# Patient Record
Sex: Female | Born: 1985
Health system: Southern US, Community
[De-identification: ages and names within clinical notes are randomized; demographics above are authoritative.]

## PROBLEM LIST (undated history)

## (undated) DIAGNOSIS — E669 Obesity, unspecified: Secondary | ICD-10-CM

## (undated) DIAGNOSIS — I10 Essential (primary) hypertension: Secondary | ICD-10-CM

## (undated) DIAGNOSIS — Z720 Tobacco use: Secondary | ICD-10-CM

## (undated) DIAGNOSIS — R7303 Prediabetes: Secondary | ICD-10-CM

## (undated) HISTORY — DX: Obesity, unspecified: E66.9

## (undated) HISTORY — DX: Tobacco use: Z72.0

## (undated) HISTORY — PX: WISDOM TOOTH EXTRACTION: SHX21

## (undated) HISTORY — DX: Prediabetes: R73.03

---
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
## 2006-04-27 ENCOUNTER — Other Ambulatory Visit: Admission: RE | Admit: 2006-04-27 | Discharge: 2006-04-27 | Payer: Self-pay | Admitting: Family Medicine

## 2006-09-06 ENCOUNTER — Other Ambulatory Visit: Admission: RE | Admit: 2006-09-06 | Discharge: 2006-09-06 | Payer: Self-pay | Admitting: Obstetrics and Gynecology

## 2007-02-03 ENCOUNTER — Inpatient Hospital Stay (HOSPITAL_COMMUNITY): Admission: AD | Admit: 2007-02-03 | Discharge: 2007-02-03 | Payer: Self-pay | Admitting: Gynecology

## 2007-02-04 ENCOUNTER — Inpatient Hospital Stay (HOSPITAL_COMMUNITY): Admission: AD | Admit: 2007-02-04 | Discharge: 2007-02-05 | Payer: Self-pay | Admitting: Obstetrics & Gynecology

## 2007-02-04 ENCOUNTER — Ambulatory Visit: Payer: Self-pay | Admitting: Certified Nurse Midwife

## 2007-10-12 ENCOUNTER — Other Ambulatory Visit: Admission: RE | Admit: 2007-10-12 | Discharge: 2007-10-12 | Payer: Self-pay | Admitting: Family Medicine

## 2015-04-29 ENCOUNTER — Emergency Department (HOSPITAL_COMMUNITY)
Admission: EM | Admit: 2015-04-29 | Discharge: 2015-04-29 | Disposition: A | Discharge status: Home / Self Care | Payer: Medicaid Other | Source: Home / Self Care | Attending: Emergency Medicine | Admitting: Emergency Medicine

## 2015-04-29 ENCOUNTER — Encounter (HOSPITAL_COMMUNITY): Payer: Self-pay | Admitting: Emergency Medicine

## 2015-04-29 DIAGNOSIS — G5622 Lesion of ulnar nerve, left upper limb: Secondary | ICD-10-CM

## 2015-04-29 MED ORDER — GABAPENTIN 300 MG PO CAPS: 300.0000 mg | ORAL_CAPSULE | Freq: Every day | ORAL | Status: DC

## 2015-04-29 NOTE — ED Notes (Signed)
C/o left arm and hand numbness for a week No tx tried States numbness is moving up her shoulder

## 2015-04-29 NOTE — ED Provider Notes (Signed)
CSN: [id number] date & time 04/29/15  1300 History   First MD Initiated Contact with Patient 04/29/15 1315     Chief Complaint  Patient presents with  . Numbness   (Consider location/radiation/quality/duration/timing/severity/associated sxs/prior Treatment) HPI She is a 29 year old woman here for evaluation of left arm numbness. She states this started about 2 weeks ago with some numbness in her left little finger. She states it started after she woke up. She does state she sleeps with her arm tucked under her. Over the next several days the numbness spread to involve her ulnar forearm, ulnar hand, and ring finger.  She states it will get worse if she rests her arm on a table. She also states that her arm is abducted at the shoulder the numbness will go up into her upper arm and shoulder as well. She has had an occasional twitch of her biceps muscle as well. She denies any weakness or tingling.  History reviewed. No pertinent past medical history. No past surgical history on file. History reviewed. No pertinent family history. History  Substance Use Topics  . Smoking status: Not on file  . Smokeless tobacco: Not on file  . Alcohol Use: Not on file   OB History    No data available     Review of Systems As in history of present illness Allergies  Review of patient's allergies indicates no known allergies.  Home Medications   Prior to Admission medications   Medication Sig Start Date End Date Taking? Authorizing Provider  gabapentin (NEURONTIN) 300 MG capsule Take 1 capsule (300 mg total) by mouth at bedtime. For 2 weeks. 04/29/15   Charm Rings, MD   BP 168/117 mmHg  Pulse 74  Temp(Src) 98 F (36.7 C) (Oral)  Resp 18  SpO2 95% Physical Exam  Constitutional: She is oriented to person, place, and time. She appears well-developed and well-nourished. No distress.  Neck:  No cervical spine tenderness. Negative Spurling's.  Cardiovascular: Normal rate.    Pulmonary/Chest: Effort normal.  Musculoskeletal:  Left arm: No erythema or edema. 5 out of 5 strength in grip, interosseus, wrist extension, biceps, triceps, and shoulder abduction. Sensation is grossly intact throughout the arm. Reflexes are diminished but symmetric.  Neurological: She is alert and oriented to person, place, and time.    ED Course  Procedures (including critical care time) Labs Review Labs Reviewed - No data to display  Imaging Review No results found.   MDM   1. Ulnar neuropathy of left upper extremity    This is likely a compressive neuropathy of the ulnar nerve. Conservative measures as in after visit summary. Gabapentin at bedtime for the next 2 weeks. Follow-up if symptoms are worsening or do not improve in 2 weeks.  Her blood pressure is elevated. She denies any history of hypertension, but has not had a physical for a while. She does have Medicaid and is in the process of finding a doctor. I recommended that she check her blood pressure occasionally at the drug store to see if she is always high or if this is due to anxiety around her symptoms.    Charm Rings, MD 04/29/15 [phone]

## 2015-04-29 NOTE — Discharge Instructions (Signed)
Your ulnar nerve is irritated. This is likely from how you sleep and typing. Tape a pillow around your elbow at night to keep it straight and cushioned. Use a pillow or towel to pad your elbow when typing at work. Take gabapentin at bedtime for the next 2 weeks. This medicine will make you sleepy. If the pain is getting worse or has not improved in 2 weeks, please follow-up. Continue to work on finding a primary care doctor. Please check your blood pressure at the drug store to see if you are always high.

## 2016-09-02 HISTORY — PX: WISDOM TOOTH EXTRACTION: SHX21

## 2016-11-25 ENCOUNTER — Encounter: Payer: Self-pay | Admitting: Family Medicine

## 2016-11-25 ENCOUNTER — Ambulatory Visit (INDEPENDENT_AMBULATORY_CARE_PROVIDER_SITE_OTHER): Payer: Medicaid Other | Admitting: Family Medicine

## 2016-11-25 DIAGNOSIS — L0232 Furuncle of buttock: Secondary | ICD-10-CM

## 2016-11-25 NOTE — Patient Instructions (Signed)
Thank you for coming in today, it was so nice to see you! Today we talked about:    Boil: It looks like you have a boil on your right buttock. It does not look infected at this time. It is draining which is good. Please wash it at least once a day with soap and water and keep it nice and clean and dry. This should heal on its own. You may get a little more drainage over the next couple days. You can put a small gauze over the area if it continues to drain just so doesn't get on your clothes.   Reasons to come back to the clinic are if your pain gets worse, you get a fever, or the drainage doesn't stop after the next couple days.  Please follow up in the next appointment so we can get to establish care at this clinic.  Bring in all your medications or supplements to each appointment for review.   If you have any questions or concerns, please do not hesitate to call the office at [phone]. You can also message me directly via MyChart.   Sincerely,  Anders Simmondshristina Jibril Mcminn, MD   Skin Abscess A skin abscess is an infected area on or under your skin that contains pus and other material. An abscess can happen almost anywhere on your body. Some abscesses break open (rupture) on their own. Most continue to get worse unless they are treated. The infection can spread deeper into the body and into your blood, which can make you feel sick. Treatment usually involves draining the abscess. Follow these instructions at home: Abscess Care  If you have an abscess that has not drained, place a warm, clean, wet washcloth over the abscess several times a day. Do this as told by your doctor.  Follow instructions from your doctor about how to take care of your abscess. Make sure you:  Cover the abscess with a bandage (dressing).  Change your bandage or gauze as told by your doctor.  Wash your hands with soap and water before you change the bandage or gauze. If you cannot use soap and water, use hand  sanitizer.  Check your abscess every day for signs that the infection is getting worse. Check for:  More redness, swelling, or pain.  More fluid or blood.  Warmth.  More pus or a bad smell. Medicines  Take over-the-counter and prescription medicines only as told by your doctor.  If you were prescribed an antibiotic medicine, take it as told by your doctor. Do not stop taking the antibiotic even if you start to feel better. General instructions  To avoid spreading the infection:  Do not share personal care items, towels, or hot tubs with others.  Avoid making skin-to-skin contact with other people.  Keep all follow-up visits as told by your doctor. This is important. Contact a doctor if:  You have more redness, swelling, or pain around your abscess.  You have more fluid or blood coming from your abscess.  Your abscess feels warm when you touch it.  You have more pus or a bad smell coming from your abscess.  You have a fever.  Your muscles ache.  You have chills.  You feel sick. Get help right away if:  You have very bad (severe) pain.  You see red streaks on your skin spreading away from the abscess. This information is not intended to replace advice given to you by your health care provider. Make sure you discuss  any questions you have with your health care provider. Document Released: 03/07/2008 Document Revised: 05/15/2016 Document Reviewed: 07/29/2015 Elsevier Interactive Patient Education  2017 [employer].

## 2016-11-25 NOTE — Assessment & Plan Note (Addendum)
Boil on right buttock near gluteal cleft. Has already drained this morning, and no areas of fluctuance or surrounding erythema. No signs of systemic infection.  -Tylenol when necessary for pain -Instructed to clean the area with soap and water daily and keep clean and dry daily until it fully heals -Return precautions discussed -Will follow-up with me in the future to establish care

## 2016-11-25 NOTE — Progress Notes (Signed)
   Subjective:    Patient ID: Norma Ramsey , female   DOB: Dec 03, 1985 , 31 y.o..   MRN: [id number]  HPI  Norma Ramsey is here for a same day visit for:  1. Boil: Patient is that over the last couple days she's noticed a painful area on her buttocks. She thinks that this is in the middle of her buttocks. She felt a bump that has got bigger and more painful over the last couple days although this morning she noted that there was some blood that came from the area and the pain has come little bit better. She is not taking any medications for this. She does not have any diabetes or any other skin conditions. She denies any fever, dizziness, nausea, vomiting, diarrhea, abdominal pain, weakness. She notes that her appetite has been good. Normal stools.  Review of Systems: Per HPI. All other systems reviewed and are negative.   Past Medical History: Hypertension  Medications: None  Social Hx: Does not smoke   Objective:   BP (!) 146/100   Pulse 80   Temp 97.9 F (36.6 C) (Oral)   Wt (!) 324 lb 12.8 oz (147.3 kg)   SpO2 97%  Physical Exam  Gen: NAD, alert, cooperative with exam, well-appearing Cardiac: Regular rate and rhythm, no edema, capillary refill brisk  Respiratory: Clear to auscultation bilaterally, no wheezes, non-labored breathing Skin: Small 2-3 mm area of induration on the right upper buttock near the gluteal cleft, open but not actively draining any pus or blood, no fluctuation or surrounding erythema  Psych: good insight, normal mood and affect   Assessment & Plan:  Boil of buttock Boil on right buttock near gluteal cleft. Has already drained this morning, and no areas of fluctuance or surrounding erythema. No signs of systemic infection.  -Tylenol when necessary for pain -Instructed to clean the area with soap and water daily and keep clean and dry daily until it fully heals -Return precautions discussed -Will follow-up with me in the future to establish  care  Anders Simmondshristina Habib Kise, MD [facility], PGY-2

## 2016-12-18 NOTE — Progress Notes (Signed)
HPI:  Norma Ramsey is a 31 y.o. year old female who presents today for a new patient appointment to establish general primary care, also to discuss:  1. Weight gain: Patient is concerned that her weight continues to go up. She notes that she has had steady weight gain since 2008. In 2008 she was placed on bedrest during her pregnancy with her son. She believes that her weight gain started on. She notes that she used to work the third shift as well in 2010 for about one year which affected her sleep and eating patterns. She notes that she does not eat much, may be once a day she will eat a meal. She has been trying to stay away from "unhealthy "foods but nothing seems to help her lose weight. She is minimally physically active during the day as she has a desk job and at night she is home taking care of her son. She denies ever having any thyroid abnormalities but does note that her great uncle had thyroid cancer.  2. Concerned about diabetes: Patient is worried that she may have diabetes because some of her family members have it. She denies any polyuria, polydipsia, polyphagia. She notes that she has never been told that she has diabetes before.  ROS:  Review of Systems  Constitutional: Negative for fever and weight loss.  HENT: Negative for ear pain, hearing loss and sinus pain.   Eyes: Negative for blurred vision.  Respiratory: Negative for cough, shortness of breath and wheezing.   Cardiovascular: Negative for chest pain and leg swelling.  Gastrointestinal: Negative for abdominal pain, blood in stool, constipation, diarrhea, heartburn, melena, nausea and vomiting.  Genitourinary: Negative for dysuria and frequency.  Musculoskeletal: Negative for back pain and joint pain.  Skin: Negative for rash.  Neurological: Negative for dizziness, tingling, focal weakness and headaches.  Psychiatric/Behavioral: Negative for depression and suicidal ideas.   Past Medical Hx:  Past Medical History:    Diagnosis Date  . Obesity   . Tobacco abuse     Past Surgical Hx:  Past Surgical History:  Procedure Laterality Date  . WISDOM TOOTH EXTRACTION  09/2016    Family Hx: updated in Epic  Social Hx:  Social History   Social History  . Marital status: Single    Spouse name: N/A  . Number of children: N/A  . Years of education: some technical college   Occupational History  . customer service rep     Social History Main Topics  . Smoking status: Current Every Day Smoker    Packs/day: 0.30    Start date: 11/26/2011  . Smokeless tobacco: Never Used  . Alcohol use 0.6 oz/week    1 Glasses of wine per week  . Drug use: Yes    Frequency: 7.0 times per week    Types: Marijuana  . Sexual activity: No   Other Topics Concern  . Not on file   Social History Narrative  . No narrative on file  Lives in [place] Lives with parents, sister, niece, and son Works in Financial trader service  Health Maintenance:  Patient notes that she is up-to-date on her health maintenance. She had her Pap smear in 2016 or 2017 at [facility] primary care   PHYSICAL EXAM: BP 126/82   Pulse (!) 104   Temp 98.3 F (36.8 C) (Oral)   Ht 5\' 3"  (1.6 m)   Wt (!) 322 lb 6.4 oz (146.2 kg)   SpO2 99%   BMI 57.11 kg/m  General: In NAD, well developed, pleasant, morbidly obese Cardiovascular: RRR, normal s1 and s2, no rubs, gallops, or murmurs Respiratory: no increased work of breathing, clear to auscultation bilaterally Abdomen: soft, non-tender, obese abdomen, +BS Extremities: no edema MSK: normal ROM  Neuro: A&O x3, no gross motor defecits  Psych: appropriate mood and affect   ASSESSMENT/PLAN:  Health maintenance:  -Up-to-date per patient report, we will obtain records from prior PCP   Severe obesity (BMI >= 40) (HCC) Body mass index is 57.11 kg/m.Marland Kitchen. Patient does express interest in improving her nutrition and finding healthy ways to lose weight. -Will refer to Dr. Gerilyn PilgrimSykes our in-house nutritionist,  patient given Dr. Gerilyn PilgrimSykes phone number to call and schedule an appointment -Advised patient to eat at least 3 meals a day instead of just one large meal -We'll check TSH to screen for hypothyroidism and check hemoglobin A1c to screen for diabetes  Tobacco abuse Currently smokes about a third pack a day. Not interested in quitting at this time. -Smoking cessation counseling provided   Anders Simmondshristina Gambino, MD PGY-2 [facility]

## 2016-12-19 ENCOUNTER — Ambulatory Visit (INDEPENDENT_AMBULATORY_CARE_PROVIDER_SITE_OTHER): Payer: Medicaid Other | Admitting: Family Medicine

## 2016-12-19 ENCOUNTER — Encounter: Payer: Self-pay | Admitting: Family Medicine

## 2016-12-19 VITALS — BP 126/82 | HR 104 | Temp 98.3°F | Ht 63.0 in | Wt 322.4 lb

## 2016-12-19 DIAGNOSIS — Z1329 Encounter for screening for other suspected endocrine disorder: Secondary | ICD-10-CM

## 2016-12-19 DIAGNOSIS — E669 Obesity, unspecified: Secondary | ICD-10-CM

## 2016-12-19 DIAGNOSIS — Z131 Encounter for screening for diabetes mellitus: Secondary | ICD-10-CM

## 2016-12-19 DIAGNOSIS — Z720 Tobacco use: Secondary | ICD-10-CM | POA: Insufficient documentation

## 2016-12-19 NOTE — Assessment & Plan Note (Signed)
Currently smokes about a third pack a day. Not interested in quitting at this time. -Smoking cessation counseling provided

## 2016-12-19 NOTE — Patient Instructions (Signed)
Thank you for coming in today, it was so nice to see you! Today we talked about:    Weight: I think that you would greatly benefit from seeing a nutritionist.  Please call Dr Gerilyn PilgrimSykes at [phone] to schedule an appointment.  Your blood pressure looks great!  I think we should screen for diabetes and thyroid issues. Please call the clinic 1 day before you plan to come in. You can come in anytime from 9am-5pm Monday through Friday.   Please follow up in 1 year for your next annual visit.   If you have any questions or concerns, please do not hesitate to call the office at [phone]. You can also message me directly via MyChart.   Sincerely,  Anders Simmondshristina Gambino, MD

## 2016-12-19 NOTE — Assessment & Plan Note (Addendum)
Body mass index is 57.11 kg/m.Marland Kitchen. Patient does express interest in improving her nutrition and finding healthy ways to lose weight. -Will refer to Dr. Gerilyn PilgrimSykes our in-house nutritionist, patient given Dr. Gerilyn PilgrimSykes phone number to call and schedule an appointment -Advised patient to eat at least 3 meals a day instead of just one large meal -We'll check TSH to screen for hypothyroidism and check hemoglobin A1c to screen for diabetes

## 2016-12-23 NOTE — Addendum Note (Signed)
Addended by: Beaulah DinningGAMBINO, CHRISTINA M on: 12/23/2016 09:45 AM   Modules accepted: Orders

## 2017-03-08 ENCOUNTER — Encounter: Payer: Self-pay | Admitting: Family Medicine

## 2017-03-08 ENCOUNTER — Observation Stay (HOSPITAL_COMMUNITY)
Admission: AD | Admit: 2017-03-08 | Discharge: 2017-03-09 | Disposition: A | Discharge status: Home / Self Care | Payer: Medicaid Other | Source: Ambulatory Visit | Attending: Family Medicine | Admitting: Family Medicine

## 2017-03-08 ENCOUNTER — Encounter (HOSPITAL_COMMUNITY): Payer: Self-pay | Admitting: *Deleted

## 2017-03-08 ENCOUNTER — Ambulatory Visit (INDEPENDENT_AMBULATORY_CARE_PROVIDER_SITE_OTHER): Payer: Medicaid Other | Admitting: Family Medicine

## 2017-03-08 VITALS — BP 194/117 | HR 93 | Temp 98.0°F | Ht 63.0 in | Wt 308.6 lb

## 2017-03-08 DIAGNOSIS — Z91410 Personal history of adult physical and sexual abuse: Secondary | ICD-10-CM | POA: Diagnosis not present

## 2017-03-08 DIAGNOSIS — R319 Hematuria, unspecified: Secondary | ICD-10-CM

## 2017-03-08 DIAGNOSIS — R3121 Asymptomatic microscopic hematuria: Secondary | ICD-10-CM | POA: Diagnosis not present

## 2017-03-08 DIAGNOSIS — R809 Proteinuria, unspecified: Secondary | ICD-10-CM | POA: Diagnosis not present

## 2017-03-08 DIAGNOSIS — Z6841 Body Mass Index (BMI) 40.0 and over, adult: Secondary | ICD-10-CM | POA: Insufficient documentation

## 2017-03-08 DIAGNOSIS — R030 Elevated blood-pressure reading, without diagnosis of hypertension: Secondary | ICD-10-CM | POA: Diagnosis not present

## 2017-03-08 DIAGNOSIS — R7303 Prediabetes: Secondary | ICD-10-CM | POA: Insufficient documentation

## 2017-03-08 DIAGNOSIS — I161 Hypertensive emergency: Secondary | ICD-10-CM | POA: Diagnosis not present

## 2017-03-08 DIAGNOSIS — Z131 Encounter for screening for diabetes mellitus: Secondary | ICD-10-CM

## 2017-03-08 DIAGNOSIS — Z1329 Encounter for screening for other suspected endocrine disorder: Secondary | ICD-10-CM

## 2017-03-08 DIAGNOSIS — I160 Hypertensive urgency: Secondary | ICD-10-CM | POA: Diagnosis not present

## 2017-03-08 DIAGNOSIS — E669 Obesity, unspecified: Secondary | ICD-10-CM

## 2017-03-08 DIAGNOSIS — F17200 Nicotine dependence, unspecified, uncomplicated: Secondary | ICD-10-CM | POA: Insufficient documentation

## 2017-03-08 DIAGNOSIS — E876 Hypokalemia: Secondary | ICD-10-CM | POA: Diagnosis not present

## 2017-03-08 LAB — POCT URINALYSIS DIP (MANUAL ENTRY)
BILIRUBIN UA: NEGATIVE mg/dL
Glucose, UA: NEGATIVE mg/dL
Nitrite, UA: NEGATIVE
PH UA: 6.5 (ref 5.0–8.0)
Protein Ur, POC: 30 mg/dL — AB
SPEC GRAV UA: 1.02 (ref 1.010–1.025)
Urobilinogen, UA: 0.2 E.U./dL

## 2017-03-08 LAB — CBC
HEMATOCRIT: 44.5 % (ref 36.0–46.0)
Hemoglobin: 15.3 g/dL — ABNORMAL HIGH (ref 12.0–15.0)
MCH: 29.7 pg (ref 26.0–34.0)
MCHC: 34.4 g/dL (ref 30.0–36.0)
MCV: 86.2 fL (ref 78.0–100.0)
PLATELETS: 338 10*3/uL (ref 150–400)
RBC: 5.16 MIL/uL — AB (ref 3.87–5.11)
RDW: 13.2 % (ref 11.5–15.5)
WBC: 12.1 10*3/uL — AB (ref 4.0–10.5)

## 2017-03-08 LAB — BASIC METABOLIC PANEL
ANION GAP: 8 (ref 5–15)
BUN: 6 mg/dL (ref 6–20)
CO2: 27 mmol/L (ref 22–32)
Calcium: 9 mg/dL (ref 8.9–10.3)
Chloride: 100 mmol/L — ABNORMAL LOW (ref 101–111)
Creatinine, Ser: 0.73 mg/dL (ref 0.44–1.00)
Glucose, Bld: 103 mg/dL — ABNORMAL HIGH (ref 65–99)
POTASSIUM: 3.2 mmol/L — AB (ref 3.5–5.1)
Sodium: 135 mmol/L (ref 135–145)

## 2017-03-08 LAB — TSH: TSH: 1.552 u[IU]/mL (ref 0.350–4.500)

## 2017-03-08 LAB — POCT HEMOGLOBIN: Hemoglobin: 15.5 g/dL (ref 12.2–16.2)

## 2017-03-08 LAB — POCT UA - MICROSCOPIC ONLY

## 2017-03-08 LAB — POCT GLYCOSYLATED HEMOGLOBIN (HGB A1C): HEMOGLOBIN A1C: 5.8

## 2017-03-08 MED ORDER — SODIUM CHLORIDE 0.9% FLUSH
3.0000 mL | Freq: Two times a day (BID) | INTRAVENOUS | Status: DC
  Administered 2017-03-08: 3 mL via INTRAVENOUS

## 2017-03-08 MED ORDER — ZOLPIDEM TARTRATE 5 MG PO TABS
5.0000 mg | ORAL_TABLET | Freq: Every evening | ORAL | Status: DC | PRN
  Administered 2017-03-08: 5 mg via ORAL
  Filled 2017-03-08: qty 1

## 2017-03-08 MED ORDER — AMLODIPINE BESYLATE 5 MG PO TABS
5.0000 mg | ORAL_TABLET | Freq: Every day | ORAL | Status: DC
  Administered 2017-03-08 – 2017-03-09 (×2): 5 mg via ORAL
  Filled 2017-03-08 (×2): qty 1

## 2017-03-08 MED ORDER — HYDRALAZINE HCL 20 MG/ML IJ SOLN: 5.0000 mg | INTRAMUSCULAR | Status: DC | PRN

## 2017-03-08 MED ORDER — PNEUMOCOCCAL VAC POLYVALENT 25 MCG/0.5ML IJ INJ
0.5000 mL | INJECTION | INTRAMUSCULAR | Status: DC
  Filled 2017-03-08: qty 0.5

## 2017-03-08 MED ORDER — ACETAMINOPHEN 650 MG RE SUPP: 650.0000 mg | Freq: Four times a day (QID) | RECTAL | Status: DC | PRN

## 2017-03-08 MED ORDER — LORAZEPAM 0.5 MG PO TABS
0.5000 mg | ORAL_TABLET | Freq: Two times a day (BID) | ORAL | Status: DC | PRN
  Administered 2017-03-08 – 2017-03-09 (×2): 0.5 mg via ORAL
  Filled 2017-03-08 (×3): qty 1

## 2017-03-08 MED ORDER — CLONIDINE HCL 0.1 MG PO TABS
0.1000 mg | ORAL_TABLET | Freq: Once | ORAL | Status: AC
  Administered 2017-03-08: 0.1 mg via ORAL

## 2017-03-08 MED ORDER — POTASSIUM CHLORIDE CRYS ER 20 MEQ PO TBCR
40.0000 meq | EXTENDED_RELEASE_TABLET | Freq: Once | ORAL | Status: AC
  Administered 2017-03-08: 40 meq via ORAL
  Filled 2017-03-08: qty 2

## 2017-03-08 MED ORDER — ENOXAPARIN SODIUM 40 MG/0.4ML ~~LOC~~ SOLN
40.0000 mg | SUBCUTANEOUS | Status: DC
  Administered 2017-03-09: 40 mg via SUBCUTANEOUS
  Filled 2017-03-08: qty 0.4

## 2017-03-08 MED ORDER — ACETAMINOPHEN 325 MG PO TABS: 650.0000 mg | ORAL_TABLET | Freq: Four times a day (QID) | ORAL | Status: DC | PRN

## 2017-03-08 MED ORDER — HEPARIN SODIUM (PORCINE) 5000 UNIT/ML IJ SOLN: 5000.0000 [IU] | Freq: Three times a day (TID) | INTRAMUSCULAR | Status: DC

## 2017-03-08 NOTE — Progress Notes (Signed)
Patient requested sleep aid, MD paged.

## 2017-03-08 NOTE — Assessment & Plan Note (Addendum)
Blood pressure at outside facility >200/100 and was 200/113 today in office.  No objective signs/symptoms of end organ damage, with the exception hematuria possibly being related to elevated pressures.  Was given clonidine 0.1mg  and observed for 30 min with minimal improvement of BP to 194/117.  Discussed case with Dr. Lum BabeEniola who agrees that admission is appropriate for this patient.  Unclear etiology. Patient denies bradycardia/tachycardia, heat/cold intolerance, diarrhea or constipation and is unlikely to be related to thyroid. Will check BMP to evaluate for hypokalemia and order renin/aldosterone if confirmed. Can trend pulse ox readings and consider sleep apnea induced HTN given morbid obesity.  - admit for observation with telemetry under attending Eniola - TSH, FT4 - continuous pulse ox  - f/u BMP - renin/aldosterone ratio if hypokalemic - renal doppler - hydralazine 5mg   PRN SBP >180 and DBP >110 - start on 5-10 mg amlodipine daily

## 2017-03-08 NOTE — Assessment & Plan Note (Addendum)
Patient noted to have hematuria at outside facility and had mod blood on dipstick with 0-3 RBCs on UA today in office.  Denies dysuria, increased urinary frequency and has no CVA tenderness or suprapubic tenderness on exam.  Unlikely to be urinary tract infection. No objective signs/symptoms of kidney stones. Not able to rule out more serious pathology like RCC at this time.  - renal u/s, consider CT - f/u urine culture - BMP - CBC - consider urology c/s
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
## 2017-03-08 NOTE — Progress Notes (Signed)
Subjective:  Norma Ramsey is a 31 y.o. female who presents to the [facility] today for a follow-up for hematuria and hypertension  HPI:  Norma Ramsey is a 31 year old female presenting today for a follow up for blood in her urine and hypertensive urgency after being seen at Urgent Care in [place] while in vacation.  She presented for nausea, vomiting and some loose stools and was noted to have hematuria on a routine UA and had blood pressure >200/100. At that time she had no chest pain, SOB, changes in vision or headache and denies any symptoms today.  Additionally, she has no dysuria, increased urinary frequency.  She has not had sex in 6 months and has a nexplanon and rarely has her period and is not currently spotting.  She denies any back pain or abdominal pain.   PMH: morbid obesity Tobacco use: current smoker 10 cigarettes per day Medication: reviewed and updated ROS: see HPI   Objective:  Physical Exam: BP (!) 194/117   Pulse 93   Temp 98 F (36.7 C) (Oral)   Ht 5\' 3"  (1.6 m)   Wt (!) 308 lb 9.6 oz (140 kg)   SpO2 98%   BMI 54.67 kg/m   Gen: 31 yo F in NAD, resting comfortably CV: RRR with no murmurs appreciated Pulm: NWOB, CTAB with no crackles, wheezes, or rhonchi GI: Normal bowel sounds present. Soft, Nontender, Nondistended. MSK: no edema, cyanosis, or clubbing noted, no CVA tenderness Skin: warm, dry Neuro: CN 2-12 WNL. No FND, weakness or changes in sensation.  Psych: Normal affect and thought content  Results for orders placed or performed in visit on 03/08/17 (from the past 72 hour(s))  POCT glycosylated hemoglobin (Hb A1C)     Status: None   Collection Time: 03/08/17  3:00 PM  Result Value Ref Range   Hemoglobin A1C 5.8   POCT urinalysis dipstick     Status: Abnormal   Collection Time: 03/08/17  3:00 PM  Result Value Ref Range   Color, UA yellow yellow   Clarity, UA turbid (A) clear   Glucose, UA negative negative mg/dL   Bilirubin, UA small (A) negative   Ketones, POC UA negative negative mg/dL   Spec Grav, UA 1.610 9.604 - 1.025   Blood, UA moderate (A) negative   pH, UA 6.5 5.0 - 8.0   Protein Ur, POC =30 (A) negative mg/dL   Urobilinogen, UA 0.2 0.2 or 1.0 E.U./dL   Nitrite, UA Negative Negative   Leukocytes, UA Trace (A) Negative  Hemoglobin     Status: None   Collection Time: 03/08/17  3:00 PM  Result Value Ref Range   Hemoglobin 15.5 12.2 - 16.2 g/dL  POCT UA - Microscopic Only     Status: Abnormal   Collection Time: 03/08/17  3:00 PM  Result Value Ref Range   WBC, Ur, HPF, POC NONE    RBC, urine, microscopic 0-3    Bacteria, U Microscopic 2+ RODS    Epithelial cells, urine per micros 5-10      Assessment/Plan:  Hematuria Patient noted to have hematuria at outside facility and had mod blood on dipstick with 0-3 RBCs on UA today in office.  Denies dysuria, increased urinary frequency and has no CVA tenderness or suprapubic tenderness on exam.  Unlikely to be urinary tract infection. No objective signs/symptoms of kidney stones. Not able to rule out more serious pathology like RCC at this time.  - renal u/s, consider CT - f/u urine  culture - BMP - CBC - consider urology c/s  Hypertensive urgency Blood pressure at outside facility >200/100 and was 200/113 today in office.  No objective signs/symptoms of end organ damage, with the exception hematuria possibly being related to elevated pressures.  Was given clonidine 0.1mg  and observed for 30 min with minimal improvement of BP to 194/117.  Discussed case with Dr. Lum BabeEniola who agrees that admission is appropriate for this patient.  Unclear etiology. Patient denies bradycardia/tachycardia, heat/cold intolerance, diarrhea or constipation and is unlikely to be related to thyroid. Will check BMP to evaluate for hypokalemia and order renin/aldosterone if confirmed. Can trend pulse ox readings and consider sleep apnea induced HTN given morbid obesity.  - admit for observation with telemetry  under attending Eniola - continuous pulse ox  - f/u BMP - renin/aldosterone ratio if hypokalemic - renal doppler - hydralazine 5mg   PRN SBP >180 and DBP >110 - start on 5-10 mg amlodipine daily

## 2017-03-09 ENCOUNTER — Observation Stay (HOSPITAL_COMMUNITY): Payer: Medicaid Other

## 2017-03-09 DIAGNOSIS — I161 Hypertensive emergency: Secondary | ICD-10-CM | POA: Diagnosis not present

## 2017-03-09 DIAGNOSIS — I160 Hypertensive urgency: Secondary | ICD-10-CM | POA: Diagnosis not present

## 2017-03-09 LAB — LIPID PANEL
CHOL/HDL RATIO: 5.6 ratio — AB (ref 0.0–4.4)
Cholesterol, Total: 219 mg/dL — ABNORMAL HIGH (ref 100–199)
HDL: 39 mg/dL — ABNORMAL LOW (ref >39)
LDL Calculated: 157 mg/dL — ABNORMAL HIGH (ref 0–99)
Triglycerides: 114 mg/dL (ref 0–149)
VLDL Cholesterol Cal: 23 mg/dL (ref 5–40)

## 2017-03-09 LAB — CBC
HCT: 41.6 % (ref 36.0–46.0)
Hemoglobin: 14.2 g/dL (ref 12.0–15.0)
MCH: 29.3 pg (ref 26.0–34.0)
MCHC: 34.1 g/dL (ref 30.0–36.0)
MCV: 86 fL (ref 78.0–100.0)
PLATELETS: 297 10*3/uL (ref 150–400)
RBC: 4.84 MIL/uL (ref 3.87–5.11)
RDW: 13.2 % (ref 11.5–15.5)
WBC: 12.6 10*3/uL — ABNORMAL HIGH (ref 4.0–10.5)

## 2017-03-09 LAB — COMPREHENSIVE METABOLIC PANEL
ALT: 41 IU/L — AB (ref 0–32)
AST: 38 IU/L (ref 0–40)
Albumin/Globulin Ratio: 1.4 (ref 1.2–2.2)
Albumin: 4.3 g/dL (ref 3.5–5.5)
Alkaline Phosphatase: 131 IU/L — ABNORMAL HIGH (ref 39–117)
BUN/Creatinine Ratio: 11 (ref 9–23)
BUN: 7 mg/dL (ref 6–20)
Bilirubin Total: 0.5 mg/dL (ref 0.0–1.2)
CALCIUM: 9.2 mg/dL (ref 8.7–10.2)
CO2: 24 mmol/L (ref 18–29)
CREATININE: 0.66 mg/dL (ref 0.57–1.00)
Chloride: 101 mmol/L (ref 96–106)
GFR calc Af Amer: 138 mL/min/{1.73_m2} (ref >59)
GFR, EST NON AFRICAN AMERICAN: 120 mL/min/{1.73_m2} (ref >59)
GLOBULIN, TOTAL: 3.1 g/dL (ref 1.5–4.5)
Glucose: 100 mg/dL — ABNORMAL HIGH (ref 65–99)
Potassium: 3.7 mmol/L (ref 3.5–5.2)
SODIUM: 142 mmol/L (ref 134–144)
Total Protein: 7.4 g/dL (ref 6.0–8.5)

## 2017-03-09 LAB — CBC WITH DIFFERENTIAL/PLATELET
BASOS: 0 %
Basophils Absolute: 0 10*3/uL (ref 0.0–0.2)
EOS (ABSOLUTE): 0.4 10*3/uL (ref 0.0–0.4)
EOS: 3 %
HEMATOCRIT: 44 % (ref 34.0–46.6)
Hemoglobin: 15.1 g/dL (ref 11.1–15.9)
Immature Grans (Abs): 0 10*3/uL (ref 0.0–0.1)
Immature Granulocytes: 0 %
LYMPHS ABS: 3.6 10*3/uL — AB (ref 0.7–3.1)
Lymphs: 35 %
MCH: 30.4 pg (ref 26.6–33.0)
MCHC: 34.3 g/dL (ref 31.5–35.7)
MCV: 89 fL (ref 79–97)
MONOS ABS: 1 10*3/uL — AB (ref 0.1–0.9)
Monocytes: 10 %
NEUTROS ABS: 5.4 10*3/uL (ref 1.4–7.0)
Neutrophils: 52 %
Platelets: 329 10*3/uL (ref 150–379)
RBC: 4.96 x10E6/uL (ref 3.77–5.28)
RDW: 14.2 % (ref 12.3–15.4)
WBC: 10.4 10*3/uL (ref 3.4–10.8)

## 2017-03-09 LAB — BASIC METABOLIC PANEL
Anion gap: 8 (ref 5–15)
BUN: 7 mg/dL (ref 6–20)
CALCIUM: 8.7 mg/dL — AB (ref 8.9–10.3)
CO2: 26 mmol/L (ref 22–32)
CREATININE: 0.7 mg/dL (ref 0.44–1.00)
Chloride: 101 mmol/L (ref 101–111)
GFR calc non Af Amer: >60 mL/min (ref >60)
Glucose, Bld: 117 mg/dL — ABNORMAL HIGH (ref 65–99)
Potassium: 3.5 mmol/L (ref 3.5–5.1)
Sodium: 135 mmol/L (ref 135–145)

## 2017-03-09 LAB — HIV ANTIBODY (ROUTINE TESTING W REFLEX): HIV Screen 4th Generation wRfx: NONREACTIVE

## 2017-03-09 LAB — TSH: TSH: 0.78 u[IU]/mL (ref 0.450–4.500)

## 2017-03-09 LAB — PREGNANCY, URINE: Preg Test, Ur: NEGATIVE

## 2017-03-09 MED ORDER — HYDROCHLOROTHIAZIDE 12.5 MG PO CAPS
12.5000 mg | ORAL_CAPSULE | Freq: Every day | ORAL | Status: DC
  Administered 2017-03-09: 12.5 mg via ORAL
  Filled 2017-03-09: qty 1

## 2017-03-09 MED ORDER — AMLODIPINE BESYLATE 10 MG PO TABS
10.0000 mg | ORAL_TABLET | Freq: Every day | ORAL | Status: DC
  Filled 2017-03-09: qty 1

## 2017-03-09 MED ORDER — ENOXAPARIN SODIUM 80 MG/0.8ML ~~LOC~~ SOLN: 65.0000 mg | SUBCUTANEOUS | Status: DC

## 2017-03-09 MED ORDER — AMLODIPINE BESYLATE 5 MG PO TABS
5.0000 mg | ORAL_TABLET | Freq: Once | ORAL | Status: AC
  Administered 2017-03-09: 5 mg via ORAL
  Filled 2017-03-09: qty 1

## 2017-03-09 MED ORDER — HYDROCHLOROTHIAZIDE 12.5 MG PO CAPS: 12.5000 mg | ORAL_CAPSULE | Freq: Every day | ORAL | 0 refills | Status: DC

## 2017-03-09 MED ORDER — AMLODIPINE BESYLATE 10 MG PO TABS: 10.0000 mg | ORAL_TABLET | Freq: Every day | ORAL | 0 refills | Status: DC

## 2017-03-09 NOTE — Plan of Care (Signed)
Problem: Safety: Goal: Ability to remain free from injury will improve Outcome: Progressing Patient verbalized understanding of preventions to reduce fall and injury.  Pt correctly demonstrated use of call bell when needing assistance.  Pt progressing towards goal.

## 2017-03-09 NOTE — Progress Notes (Signed)
  Family Medicine Teaching Service Daily Progress Note Intern Pager: [phone]  Patient name: Norma Ramsey Medical record number: [id number] Date of birth: 1985-11-18 Age: 31 y.o. Gender: female  Primary Care Provider: Beaulah DinningGambino, Christina M, MD Consultants: none Code Status: Full  Pt Overview and Major Events to Date:  6/6- admitted to FPTS for HBP  Assessment and Plan: Norma Ramsey is a 31 y.o. female presenting with severely elevated blood pressure and concern for end organ damage with hematuria. PMH is significant for obesity and tobacco abuse.  Hypertensive Urgency vs. Emergency: Improved with Norvasc but still not at goal. Persistently elevated this AM 173/97 this morning - renal US pending - renin/aldosterone pending - Continue amlodipine 5 mg daily, will likely need increased dose at outpt follow up - PRN hydralazine for SBP > 180 and DBP > 110  Hematuria: May be sequela of elevated BP. Upreg negative, Hgb stable - Follow urine culture - renal US pending  Prediabetes: Hgb A1c 5.8 - continue counseling on lifestyle modification  Tobacco abuse: - Declines nicotine patch - continue to encouraged cessation  FEN/GI: Heart healthy diet Prophylaxis: lovenox  Disposition: home pending renal US  Subjective:  Ms. Norma Ramsey is doing well this morning, denies any complaints. No HA, Vision changes. She is tearful throughout the interview when discussing weight and health issues. She endorses poor sleep and snoring.   Objective: Temp:  [97.7 F (36.5 C)-98.5 F (36.9 C)] 98.5 F (36.9 C) (06/07 0828) Pulse Rate:  [78-93] 83 (06/07 0828) Resp:  [17-20] 17 (06/07 0648) BP: (144-200)/(92-117) 173/97 (06/07 0828) SpO2:  [98 %-100 %] 100 % (06/07 0828) Weight:  [304 lb 12.8 oz (138.3 kg)-308 lb 9.6 oz (140 kg)] 304 lb 12.8 oz (138.3 kg) (06/07 30860648) Physical Exam: General: obese female in NAD Cardiovascular: RRR no MRG Respiratory: CTAB no increased WOB Abdomen:  soft, NTND Extremities: no edema or cyanosis Psych: tearful, mood is anxious, appropriate affect  Laboratory:  Recent Labs Lab 03/08/17 1500 03/08/17 1845 03/09/17 0402  WBC  --  12.1* 12.6*  HGB 15.5 15.3* 14.2  HCT  --  44.5 41.6  PLT  --  338 297    Recent Labs Lab 03/08/17 1845 03/09/17 0402  NA 135 135  K 3.2* 3.5  CL 100* 101  CO2 27 26  BUN 6 7  CREATININE 0.73 0.70  CALCIUM 9.0 8.7*  GLUCOSE 103* 117*   HIV NR TSH 1.552 Upreg negative A1C 5.8  Imaging/Diagnostic Tests: No results found.  Tillman SersRiccio, Lethia Donlon C, DO 03/09/2017, 9:47 AM PGY-1, [facility] FPTS Intern pager: [phone], text pages welcome

## 2017-03-09 NOTE — Progress Notes (Signed)
Transitions of Care Pharmacy Note  Plan:  Educated on new antihypertensive regimen of HCTZ and amlodipine Counseled on possible AE and S/Sx hypotension --------------------------------------------- Norma Ramsey is an 31 y.o. female who presents with a chief complaint hypertension. In anticipation of discharge, pharmacy has reviewed this patient's prior to admission medication history, as well as [facility].  Current medication indications, dosing, frequency, and notable side effects reviewed with patient. patient verbalized understanding of current inpatient medication regimen and is aware that the After Visit Summary when presented, will represent the most accurate medication list at discharge.   Assessment: Understanding of regimen: good Understanding of indications: excellent Potential of compliance: fair Barriers to Obtaining Medications: No  Patient instructed to contact inpatient pharmacy team with further questions or concerns if needed.    Time spent preparing for discharge counseling: 15 Time spent counseling patient: 5   Thank you for allowing pharmacy to be a part of this patient's care.  Fredonia HighlandMichael Denzal Meir, PharmD PGY-1 Pharmacy Resident Pager: [phone] 03/09/2017

## 2017-03-09 NOTE — Plan of Care (Signed)
Problem: Food- and Nutrition-Related Knowledge Deficit (NB-1.1) Goal: Nutrition education Formal process to instruct or train a patient/client in a skill or to impart knowledge to help patients/clients voluntarily manage or modify food choices and eating behavior to maintain or improve health.  Outcome: Completed/Met Date Met: 03/09/17 Nutrition Education Note  RD consulted for nutrition education regarding new HTN.   RD provided "Hypertensive Nutrition Therapy" handout as well as "General, Healthy Nutrition Therapy" from the Academy of Nutrition and Dietetics. Reviewed patient's dietary recall. Provided examples on ways to decrease sodium and fat intake in diet. Discouraged intake of processed foods and use of salt shaker. Encouraged fresh fruits and vegetables as well as whole grain sources of carbohydrates to maximize fiber intake. Teach back method used.  Expect good compliance.  Body mass index is 53.99 kg/m. Pt meets criteria for morbid obesity based on current BMI.  Pt being discharged to home today.   Kerman Passey MS, RD, LDN [phone] Pager  [phone] Weekend/On-Call Pager

## 2017-03-09 NOTE — Discharge Summary (Signed)
Family Medicine Teaching [facility] Discharge Summary  Patient name: Norma Ramsey Medical record number: [id number] Date of birth: [birth year] Age: 31 y.o. Gender: female Date of Admission: 03/08/2017  Date of Discharge: 03/09/17  Admitting Physician: Moses Manners, MD  Primary Care Provider: Beaulah Dinning, MD Consultants: none  Indication for Hospitalization: HTN urgency  Discharge Diagnoses/Problem List:  HTN urgency Hematuria Obesity Tobacco abuse Prediabetes  Disposition: home  Discharge Condition: stable, improved  Discharge Exam: see progress note from day of discharge  Brief Hospital Course:   Norma Ramsey is a 31 year old female with PMH of Obesity and Tobacco abuse who was directly admitted from [facility] due to hypertensive urgency. She was told to see her PCP by Urgent Care due to hematuria found on their work up several days prior to admission. She was given Clonidine at outpatient office with little improvement in BP. She was admitted to [facility] for management. Patient was noted to have persistent hematuria on admission with otherwise normal kidney function. Hematuria was mild and thought to be transient in nature. She was started on 5 mg of Norvasc and observed overnight. The following morning BP remained elevated so Norvasc was increased to 10mg  and 12.5 mg HCTZ was added. Renal US was normal. Patient was deemed stable for discharge with close BP monitoring in the outpatient setting.   Issues for Follow Up:  1. Follow up blood pressure and titrate up BP medications as needed, consider addition of ACE inhibitor in future if patient develops diabetes 2. Monitor hematuria on outpatient basis 3. Patient endorses history of snoring, poor sleep, would benefit from outpatient sleep study 4. Patient interested in working with nutritionist to work on weight loss 5. Patient interested in possibly pursuing bariatric surgery, would benefit from counseling on  this topic and possible referral to bariatric surgeon 6. Continue to encourage smoking cessation  Significant Procedures: none  Significant Labs and Imaging:   Recent Labs Lab 03/08/17 1500 03/08/17 1845 03/09/17 0402  WBC  --  12.1* 12.6*  HGB 15.5 15.3* 14.2  HCT  --  44.5 41.6  PLT  --  338 297    Recent Labs Lab 03/08/17 1845 03/09/17 0402  NA 135 135  K 3.2* 3.5  CL 100* 101  CO2 27 26  GLUCOSE 103* 117*  BUN 6 7  CREATININE 0.73 0.70  CALCIUM 9.0 8.7*    A1C 5.8 HIV negative TSH 1.552  Results/Tests Pending at Time of Discharge: final urine culture, renin/aldosterone labs  Discharge Medications:  Allergies as of 03/09/2017   No Known Allergies     Medication List    STOP taking these medications   CORICIDIN D PO     TAKE these medications   amLODipine 10 MG tablet Commonly known as:  NORVASC Take 1 tablet (10 mg total) by mouth daily. Start taking on:  03/10/2017   hydrochlorothiazide 12.5 MG capsule Commonly known as:  MICROZIDE Take 1 capsule (12.5 mg total) by mouth daily. Start taking on:  03/10/2017   NEXPLANON 68 MG Impl implant Generic drug:  etonogestrel 1 each by Subdermal route once.       Discharge Instructions: Please refer to Patient Instructions section of EMR for full details.  Patient was counseled important signs and symptoms that should prompt return to medical care, changes in medications, dietary instructions, activity restrictions, and follow up appointments.   Follow-Up Appointments: Follow-up Information    Beaulah Dinning, MD Follow up.   Specialty:  Family Medicine Contact information: [address] [place] [address] [phone]        [facility]. Go on 03/13/2017.   Specialty:  Family Medicine Why:  at 9 am for nurse blood pressure check Contact information: [address] [id number] [address] [place] [id number] [phone]          Tillman SersRiccio, Romy Mcgue  C, DO 03/09/2017, 2:00 PM PGY-1, [facility]

## 2017-03-10 LAB — URINE CULTURE

## 2017-03-13 ENCOUNTER — Ambulatory Visit (INDEPENDENT_AMBULATORY_CARE_PROVIDER_SITE_OTHER): Payer: Medicaid Other | Admitting: *Deleted

## 2017-03-13 VITALS — BP 138/106 | HR 98

## 2017-03-13 DIAGNOSIS — I160 Hypertensive urgency: Secondary | ICD-10-CM

## 2017-03-13 NOTE — Progress Notes (Signed)
Patient in today for BP check. BP taken manually in left and right arms with a large cuff. BP in left arm was 142/106 and right arm was 138/106. Patients reports regular taking of medications since ED discharge with no adverse symptoms other than fatigue. Precepted with Dr. Randolm IdolFletke who reccommends patient check BP regularly at pharmacy until upcoming appointment on 6/15 and to bring those readings with her to appointment. Patient advised of this and expressed understanding

## 2017-03-14 LAB — ALDOSTERONE + RENIN ACTIVITY W/ RATIO
ALDO / PRA Ratio: 5.2 (ref 0.0–30.0)
Aldosterone: 1.9 ng/dL (ref 0.0–30.0)
PRA LC/MS/MS: 0.363 ng/mL/h (ref 0.167–5.380)

## 2017-03-16 ENCOUNTER — Telehealth: Payer: Self-pay | Admitting: Family Medicine

## 2017-03-16 NOTE — Telephone Encounter (Signed)
Pre-visit call. No answer, LMOVM. Patient was advised to arrive early for check-in and to bring all current medication.  °

## 2017-03-17 ENCOUNTER — Encounter: Payer: Self-pay | Admitting: Family Medicine

## 2017-03-17 ENCOUNTER — Ambulatory Visit (INDEPENDENT_AMBULATORY_CARE_PROVIDER_SITE_OTHER): Payer: Medicaid Other | Admitting: Family Medicine

## 2017-03-17 VITALS — BP 125/90 | HR 69 | Temp 98.1°F | Ht 63.0 in | Wt 305.2 lb

## 2017-03-17 DIAGNOSIS — I10 Essential (primary) hypertension: Secondary | ICD-10-CM

## 2017-03-17 DIAGNOSIS — R319 Hematuria, unspecified: Secondary | ICD-10-CM | POA: Diagnosis present

## 2017-03-17 DIAGNOSIS — R822 Biliuria: Secondary | ICD-10-CM

## 2017-03-17 DIAGNOSIS — Z720 Tobacco use: Secondary | ICD-10-CM

## 2017-03-17 LAB — POCT URINALYSIS DIP (MANUAL ENTRY)
BILIRUBIN UA: NEGATIVE mg/dL
GLUCOSE UA: NEGATIVE mg/dL
Nitrite, UA: NEGATIVE
PROTEIN UA: NEGATIVE mg/dL
SPEC GRAV UA: 1.02 (ref 1.010–1.025)
Urobilinogen, UA: >=8 E.U./dL — AB
pH, UA: 6 (ref 5.0–8.0)

## 2017-03-17 LAB — POCT UA - MICROSCOPIC ONLY

## 2017-03-17 MED ORDER — HYDROCHLOROTHIAZIDE 25 MG PO TABS: 25.0000 mg | ORAL_TABLET | Freq: Every day | ORAL | 0 refills | Status: DC

## 2017-03-17 NOTE — Patient Instructions (Addendum)
Thank you for coming in today, it was so nice to see you! Today we talked about:    Blood pressure: Your blood pressure is slightly high today.We are going to increase your hydrochlorothiazide to 25 mg instead of 12.5 mg. I would suggest buying a blood pressure machine at [employer] and checking your pressure once a day. Your goal pressure is for the top number to be under 140 and the bottom number to be under 90. If your numbers are consistently higher than this, please call the clinic and I will call you back so we can talk about it. Continue to drink plenty of water until your urine is clear to light yellow  Dark urine: we have rechecked your urine today. I will call you if this is abnormal.  I think that you would greatly benefit from seeing a nutritionist.  Please call Dr Gerilyn Pilgrim at [phone] to schedule an appointment.  Quitting smoking is the best thing you can do for your health. I am here for you when you are ready to quit :)  Please follow up within the next month for your annual physical exam. You can schedule this appointment at the front desk before you leave or call the clinic.  Bring in all your medications or supplements to each appointment for review.   If we ordered any tests today, you will be notified via telephone of any abnormalities. If everything is normal you will get a letter in the mail.   If you have any questions or concerns, please do not hesitate to call the office at [phone]. You can also message me directly via MyChart.   Sincerely,  Anders Simmonds, MD    Hypertension Hypertension, commonly called high blood pressure, is when the force of blood pumping through the arteries is too strong. The arteries are the blood vessels that carry blood from the heart throughout the body. Hypertension forces the heart to work harder to pump blood and may cause arteries to become narrow or stiff. Having untreated or uncontrolled hypertension can cause heart attacks,  strokes, kidney disease, and other problems. A blood pressure reading consists of a higher number over a lower number. Ideally, your blood pressure should be below 140/90. The first ("top") number is called the systolic pressure. It is a measure of the pressure in your arteries as your heart beats. The second ("bottom") number is called the diastolic pressure. It is a measure of the pressure in your arteries as the heart relaxes. What are the causes? The cause of this condition is not known. What increases the risk? Some risk factors for high blood pressure are under your control. Others are not. Factors you can change  Smoking.  Having type 2 diabetes mellitus, high cholesterol, or both.  Not getting enough exercise or physical activity.  Being overweight.  Having too much fat, sugar, calories, or salt (sodium) in your diet.  Drinking too much alcohol. Factors that are difficult or impossible to change  Having chronic kidney disease.  Having a family history of high blood pressure.  Age. Risk increases with age.  Race. You may be at higher risk if you are African-American.  Gender. Men are at higher risk than women before age 78. After age 64, women are at higher risk than men.  Having obstructive sleep apnea.  Stress. What are the signs or symptoms? Extremely high blood pressure (hypertensive crisis) may cause:  Headache.  Anxiety.  Shortness of breath.  Nosebleed.  Nausea and vomiting.  Severe chest pain.  Jerky movements you cannot control (seizures).  How is this diagnosed? This condition is diagnosed by measuring your blood pressure while you are seated, with your arm resting on a surface. The cuff of the blood pressure monitor will be placed directly against the skin of your upper arm at the level of your heart. It should be measured at least twice using the same arm. Certain conditions can cause a difference in blood pressure between your right and left  arms. Certain factors can cause blood pressure readings to be lower or higher than normal (elevated) for a short period of time:  When your blood pressure is higher when you are in a health care provider's office than when you are at home, this is called white coat hypertension. Most people with this condition do not need medicines.  When your blood pressure is higher at home than when you are in a health care provider's office, this is called masked hypertension. Most people with this condition may need medicines to control blood pressure.  If you have a high blood pressure reading during one visit or you have normal blood pressure with other risk factors:  You may be asked to return on a different day to have your blood pressure checked again.  You may be asked to monitor your blood pressure at home for 1 week or longer.  If you are diagnosed with hypertension, you may have other blood or imaging tests to help your health care provider understand your overall risk for other conditions. How is this treated? This condition is treated by making healthy lifestyle changes, such as eating healthy foods, exercising more, and reducing your alcohol intake. Your health care provider may prescribe medicine if lifestyle changes are not enough to get your blood pressure under control, and if:  Your systolic blood pressure is above 130.  Your diastolic blood pressure is above 80.  Your personal target blood pressure may vary depending on your medical conditions, your age, and other factors. Follow these instructions at home: Eating and drinking  Eat a diet that is high in fiber and potassium, and low in sodium, added sugar, and fat. An example eating plan is called the DASH (Dietary Approaches to Stop Hypertension) diet. To eat this way: ? Eat plenty of fresh fruits and vegetables. Try to fill half of your plate at each meal with fruits and vegetables. ? Eat whole grains, such as whole wheat pasta,  brown rice, or whole grain bread. Fill about one quarter of your plate with whole grains. ? Eat or drink low-fat dairy products, such as skim milk or low-fat yogurt. ? Avoid fatty cuts of meat, processed or cured meats, and poultry with skin. Fill about one quarter of your plate with lean proteins, such as fish, chicken without skin, beans, eggs, and tofu. ? Avoid premade and processed foods. These tend to be higher in sodium, added sugar, and fat.  Reduce your daily sodium intake. Most people with hypertension should eat less than 1,500 mg of sodium a day.  Limit alcohol intake to no more than 1 drink a day for nonpregnant women and 2 drinks a day for men. One drink equals 12 oz of beer, 5 oz of wine, or 1 oz of hard liquor. Lifestyle  Work with your health care provider to maintain a healthy body weight or to lose weight. Ask what an ideal weight is for you.  Get at least 30 minutes of exercise that causes your  heart to beat faster (aerobic exercise) most days of the week. Activities may include walking, swimming, or biking.  Include exercise to strengthen your muscles (resistance exercise), such as pilates or lifting weights, as part of your weekly exercise routine. Try to do these types of exercises for 30 minutes at least 3 days a week.  Do not use any products that contain nicotine or tobacco, such as cigarettes and e-cigarettes. If you need help quitting, ask your health care provider.  Monitor your blood pressure at home as told by your health care provider.  Keep all follow-up visits as told by your health care provider. This is important. Medicines  Take over-the-counter and prescription medicines only as told by your health care provider. Follow directions carefully. Blood pressure medicines must be taken as prescribed.  Do not skip doses of blood pressure medicine. Doing this puts you at risk for problems and can make the medicine less effective.  Ask your health care provider  about side effects or reactions to medicines that you should watch for. Contact a health care provider if:  You think you are having a reaction to a medicine you are taking.  You have headaches that keep coming back (recurring).  You feel dizzy.  You have swelling in your ankles.  You have trouble with your vision. Get help right away if:  You develop a severe headache or confusion.  You have unusual weakness or numbness.  You feel faint.  You have severe pain in your chest or abdomen.  You vomit repeatedly.  You have trouble breathing. Summary  Hypertension is when the force of blood pumping through your arteries is too strong. If this condition is not controlled, it may put you at risk for serious complications.  Your personal target blood pressure may vary depending on your medical conditions, your age, and other factors. For most people, a normal blood pressure is less than 140/90.  Hypertension is treated with lifestyle changes, medicines, or a combination of both. Lifestyle changes include weight loss, eating a healthy, low-sodium diet, exercising more, and limiting alcohol. This information is not intended to replace advice given to you by your health care provider. Make sure you discuss any questions you have with your health care provider. Document Released: 09/19/2005 Document Revised: 08/17/2016 Document Reviewed: 08/17/2016 Elsevier Interactive Patient Education  [employer]2018 [employer]

## 2017-03-17 NOTE — Progress Notes (Signed)
Subjective:    Patient ID: Norma Ramsey , female   DOB: 04-30-1986 , 31 y.o..   MRN: [id number]  HPI  Norma Ramsey is here for  Chief Complaint  Patient presents with  . Hypertension    1. Hospital Follow up for hypertensive urgency:   Brief hospital course: Norma Ramsey is a 31 year old female with PMH of Obesity and Tobacco abuse who was directly [facility] due to hypertensive urgency. Patient was noted to have persistent hematuria on admission with otherwise normal kidney function. Hematuria was mild and thought to be transient in nature. She was started on 5 mg of Norvasc and observed overnight. The following morning BP remained elevated so Norvasc was increased to 10mg  and 12.5 mg HCTZ was added. Renal US was normal. Patient was deemed stable for discharge with close BP monitoring in the outpatient setting.  Since leaving the hospital, denies any hematuria. Notes that her urine is still dark though. Assumed she wasn't drinking enough water but has been drinking 64 ounces or more a day. She has been trying to lose weight but it hasn't been going well because she hasn't been losing anything. Denies any dysuria, flank pain, pelvic pain, or fever. Denies any chest pain, shortness of breath, or palpitations.   Review of Systems: Per HPI.   Past Medical History: Patient Active Problem List   Diagnosis Date Noted  . Essential hypertension 03/21/2017  . Hematuria 03/08/2017  . Hypertensive urgency 03/08/2017  . Hypertensive emergency 03/08/2017  . Obesity 12/19/2016  . Tobacco abuse 12/19/2016    Medications: reviewed and updated Current Outpatient Prescriptions  Medication Sig Dispense Refill  . amLODipine (NORVASC) 10 MG tablet Take 1 tablet (10 mg total) by mouth daily. 30 tablet 0  . etonogestrel (NEXPLANON) 68 MG IMPL implant 1 each by Subdermal route once.    . hydrochlorothiazide (HYDRODIURIL) 25 MG tablet Take 1 tablet (25 mg total) by mouth daily. 90  tablet 0   No current facility-administered medications for this visit.     Social Hx:  reports that she has been smoking.  She started smoking about 5 years ago. She has been smoking about 0.30 packs per day. She has never used smokeless tobacco.   Objective:   BP 125/90 (BP Location: Right Wrist, Patient Position: Sitting, Cuff Size: Normal)   Pulse 69   Temp 98.1 F (36.7 C) (Oral)   Ht 5\' 3"  (1.6 m)   Wt (!) 305 lb 3.2 oz (138.4 kg)   SpO2 99%   BMI 54.06 kg/m  Physical Exam  Gen: NAD, alert, cooperative with exam, well-appearing, obese HEENT: NCAT, PERRL, clear conjunctiva, oropharynx clear, supple neck Cardiac: Regular rate and rhythm, normal S1/S2, no murmur, no edema, capillary refill brisk  Respiratory: Clear to auscultation bilaterally, no wheezes, non-labored breathing Gastrointestinal: soft, non tender, non distended, bowel sounds present Skin: no rashes, normal turgor  Neurological: no gross deficits.  Psych: good insight, normal mood and affect  Assessment & Plan:  Essential hypertension Uncontrolled. Systolic normal, diastolic borderline high.  - Continue Amlodipine 10 mg daily - Increase HCTZ to 25 mg daily  - Check BP at home, discussed goal BP - Return to clinic in 3 months  Tobacco abuse Still smoking 1/3 PPD. Not interested in quiting - Smoking cessation counseling provided  Hematuria Persistent hematuria on UA today. Renal U/S normal. No UTI symptoms. Urine cx with insignificant growth.  - Will consult urology at this time  -  future order also obtained for CMP as there was bilirubin and urobilinogen in urine    Orders Placed This Encounter  Procedures  . Urine Culture  . Comprehensive metabolic panel    Standing Status:   Future    Standing Expiration Date:   03/21/2018    Order Specific Question:   Has the patient fasted?    Answer:   No  . Ambulatory referral to Urology    Referral Priority:   Routine    Referral Type:   Consultation     Referral Reason:   Specialty Services Required    Requested Specialty:   Urology    Number of Visits Requested:   1  . POCT urinalysis dipstick  . POCT UA - Microscopic Only   Meds ordered this encounter  Medications  . hydrochlorothiazide (HYDRODIURIL) 25 MG tablet    Sig: Take 1 tablet (25 mg total) by mouth daily.    Dispense:  90 tablet    Refill:  0    Anders Simmonds, MD [facility], PGY-2

## 2017-03-19 LAB — URINE CULTURE

## 2017-03-21 DIAGNOSIS — I10 Essential (primary) hypertension: Secondary | ICD-10-CM | POA: Insufficient documentation

## 2017-03-21 NOTE — Assessment & Plan Note (Addendum)
Uncontrolled. Systolic normal, diastolic borderline high.  - Continue Amlodipine 10 mg daily - Increase HCTZ to 25 mg daily  - Check BP at home, discussed goal BP - Return to clinic in 3 months

## 2017-03-21 NOTE — Assessment & Plan Note (Signed)
Still smoking 1/3 PPD. Not interested in quiting - Smoking cessation counseling provided

## 2017-03-21 NOTE — Assessment & Plan Note (Addendum)
Persistent hematuria on UA today. Renal U/S normal. No UTI symptoms. Urine cx with insignificant growth.  - Will consult urology at this time  - future order also obtained for CMP as there was bilirubin and urobilinogen in urine

## 2017-04-04 ENCOUNTER — Telehealth: Payer: Self-pay | Admitting: Family Medicine

## 2017-04-04 NOTE — Telephone Encounter (Signed)
Pt thinks her BP meds are making her nauseaus.  Pt would to discuss this with dr Jonathon Jordangambino.

## 2017-04-07 NOTE — Telephone Encounter (Signed)
Attempted to call patient to discuss further. No answer. Left voicemail. Will be happy to speak with her at a later time.   [name], MD [facility], PGY-3

## 2017-04-09 ENCOUNTER — Other Ambulatory Visit: Payer: Self-pay | Admitting: Family Medicine

## 2017-04-10 ENCOUNTER — Telehealth: Payer: Self-pay | Admitting: Family Medicine

## 2017-04-10 ENCOUNTER — Ambulatory Visit: Payer: Medicaid Other | Admitting: Student

## 2017-04-10 ENCOUNTER — Observation Stay (HOSPITAL_COMMUNITY): Payer: Medicaid Other

## 2017-04-10 ENCOUNTER — Observation Stay (HOSPITAL_COMMUNITY)
Admission: EM | Admit: 2017-04-10 | Discharge: 2017-04-11 | Disposition: A | Discharge status: Home / Self Care | Payer: Medicaid Other | Attending: Family Medicine | Admitting: Family Medicine

## 2017-04-10 ENCOUNTER — Encounter (HOSPITAL_COMMUNITY): Payer: Self-pay

## 2017-04-10 DIAGNOSIS — R112 Nausea with vomiting, unspecified: Secondary | ICD-10-CM | POA: Diagnosis not present

## 2017-04-10 DIAGNOSIS — Z79899 Other long term (current) drug therapy: Secondary | ICD-10-CM | POA: Insufficient documentation

## 2017-04-10 DIAGNOSIS — F1290 Cannabis use, unspecified, uncomplicated: Secondary | ICD-10-CM | POA: Diagnosis not present

## 2017-04-10 DIAGNOSIS — I4581 Long QT syndrome: Secondary | ICD-10-CM | POA: Insufficient documentation

## 2017-04-10 DIAGNOSIS — D72829 Elevated white blood cell count, unspecified: Secondary | ICD-10-CM | POA: Diagnosis not present

## 2017-04-10 DIAGNOSIS — R011 Cardiac murmur, unspecified: Secondary | ICD-10-CM | POA: Insufficient documentation

## 2017-04-10 DIAGNOSIS — E876 Hypokalemia: Secondary | ICD-10-CM | POA: Diagnosis present

## 2017-04-10 DIAGNOSIS — R3129 Other microscopic hematuria: Secondary | ICD-10-CM | POA: Insufficient documentation

## 2017-04-10 DIAGNOSIS — I10 Essential (primary) hypertension: Secondary | ICD-10-CM | POA: Diagnosis present

## 2017-04-10 DIAGNOSIS — R319 Hematuria, unspecified: Secondary | ICD-10-CM | POA: Diagnosis present

## 2017-04-10 DIAGNOSIS — E869 Volume depletion, unspecified: Secondary | ICD-10-CM | POA: Insufficient documentation

## 2017-04-10 DIAGNOSIS — Z6841 Body Mass Index (BMI) 40.0 and over, adult: Secondary | ICD-10-CM | POA: Insufficient documentation

## 2017-04-10 DIAGNOSIS — F17210 Nicotine dependence, cigarettes, uncomplicated: Secondary | ICD-10-CM | POA: Diagnosis not present

## 2017-04-10 DIAGNOSIS — F12988 Cannabis use, unspecified with other cannabis-induced disorder: Secondary | ICD-10-CM

## 2017-04-10 DIAGNOSIS — Z720 Tobacco use: Secondary | ICD-10-CM | POA: Diagnosis present

## 2017-04-10 DIAGNOSIS — R9431 Abnormal electrocardiogram [ECG] [EKG]: Secondary | ICD-10-CM

## 2017-04-10 DIAGNOSIS — E669 Obesity, unspecified: Secondary | ICD-10-CM | POA: Insufficient documentation

## 2017-04-10 DIAGNOSIS — R739 Hyperglycemia, unspecified: Secondary | ICD-10-CM | POA: Insufficient documentation

## 2017-04-10 DIAGNOSIS — R000 Tachycardia, unspecified: Secondary | ICD-10-CM | POA: Diagnosis present

## 2017-04-10 DIAGNOSIS — R3121 Asymptomatic microscopic hematuria: Secondary | ICD-10-CM | POA: Diagnosis not present

## 2017-04-10 HISTORY — DX: Essential (primary) hypertension: I10

## 2017-04-10 LAB — BASIC METABOLIC PANEL
ANION GAP: 10 (ref 5–15)
BUN: 5 mg/dL — ABNORMAL LOW (ref 6–20)
CALCIUM: 8.6 mg/dL — AB (ref 8.9–10.3)
CHLORIDE: 104 mmol/L (ref 101–111)
CO2: 24 mmol/L (ref 22–32)
Creatinine, Ser: 0.73 mg/dL (ref 0.44–1.00)
GFR calc Af Amer: >60 mL/min (ref >60)
GFR calc non Af Amer: >60 mL/min (ref >60)
GLUCOSE: 156 mg/dL — AB (ref 65–99)
Potassium: 2.8 mmol/L — ABNORMAL LOW (ref 3.5–5.1)
Sodium: 138 mmol/L (ref 135–145)

## 2017-04-10 LAB — CBC
HCT: 47 % — ABNORMAL HIGH (ref 36.0–46.0)
HEMATOCRIT: 41.3 % (ref 36.0–46.0)
HEMOGLOBIN: 14.8 g/dL (ref 12.0–15.0)
Hemoglobin: 17 g/dL — ABNORMAL HIGH (ref 12.0–15.0)
MCH: 31 pg (ref 26.0–34.0)
MCH: 30.9 pg (ref 26.0–34.0)
MCHC: 36.2 g/dL — ABNORMAL HIGH (ref 30.0–36.0)
MCHC: 35.8 g/dL (ref 30.0–36.0)
MCV: 85.6 fL (ref 78.0–100.0)
MCV: 86.2 fL (ref 78.0–100.0)
Platelets: 433 10*3/uL — ABNORMAL HIGH (ref 150–400)
Platelets: 383 10*3/uL (ref 150–400)
RBC: 5.49 MIL/uL — ABNORMAL HIGH (ref 3.87–5.11)
RBC: 4.79 MIL/uL (ref 3.87–5.11)
RDW: 12.7 % (ref 11.5–15.5)
RDW: 12.7 % (ref 11.5–15.5)
WBC: 23.4 10*3/uL — ABNORMAL HIGH (ref 4.0–10.5)
WBC: 21.2 10*3/uL — ABNORMAL HIGH (ref 4.0–10.5)

## 2017-04-10 LAB — COMPREHENSIVE METABOLIC PANEL
ALT: 43 U/L (ref 14–54)
AST: 36 U/L (ref 15–41)
Albumin: 4.3 g/dL (ref 3.5–5.0)
Alkaline Phosphatase: 139 U/L — ABNORMAL HIGH (ref 38–126)
Anion gap: 15 (ref 5–15)
BUN: 5 mg/dL — ABNORMAL LOW (ref 6–20)
CO2: 21 mmol/L — ABNORMAL LOW (ref 22–32)
Calcium: 9.6 mg/dL (ref 8.9–10.3)
Chloride: 100 mmol/L — ABNORMAL LOW (ref 101–111)
Creatinine, Ser: 0.76 mg/dL (ref 0.44–1.00)
GFR calc Af Amer: >60 mL/min (ref >60)
GFR calc non Af Amer: >60 mL/min (ref >60)
Glucose, Bld: 159 mg/dL — ABNORMAL HIGH (ref 65–99)
Potassium: 2.9 mmol/L — ABNORMAL LOW (ref 3.5–5.1)
Sodium: 136 mmol/L (ref 135–145)
Total Bilirubin: 0.8 mg/dL (ref 0.3–1.2)
Total Protein: 8.6 g/dL — ABNORMAL HIGH (ref 6.5–8.1)

## 2017-04-10 LAB — URINALYSIS, ROUTINE W REFLEX MICROSCOPIC
Bilirubin Urine: NEGATIVE
Glucose, UA: NEGATIVE mg/dL
Ketones, ur: NEGATIVE mg/dL
Leukocytes, UA: NEGATIVE
Nitrite: NEGATIVE
Protein, ur: 100 mg/dL — AB
Specific Gravity, Urine: 1.018 (ref 1.005–1.030)
pH: 8 (ref 5.0–8.0)

## 2017-04-10 LAB — I-STAT CG4 LACTIC ACID, ED
Lactic Acid, Venous: 3.78 mmol/L (ref 0.5–1.9)
Lactic Acid, Venous: 2.88 mmol/L (ref 0.5–1.9)

## 2017-04-10 LAB — LIPASE, BLOOD: Lipase: 32 U/L (ref 11–51)

## 2017-04-10 LAB — CBG MONITORING, ED: GLUCOSE-CAPILLARY: 172 mg/dL — AB (ref 65–99)

## 2017-04-10 LAB — I-STAT BETA HCG BLOOD, ED (MC, WL, AP ONLY): I-stat hCG, quantitative: <5 m[IU]/mL (ref <5)

## 2017-04-10 LAB — MAGNESIUM: MAGNESIUM: 1.8 mg/dL (ref 1.7–2.4)

## 2017-04-10 LAB — D-DIMER, QUANTITATIVE: D-Dimer, Quant: 0.43 ug/mL-FEU (ref 0.00–0.50)

## 2017-04-10 MED ORDER — POTASSIUM CHLORIDE CRYS ER 20 MEQ PO TBCR
40.0000 meq | EXTENDED_RELEASE_TABLET | Freq: Once | ORAL | Status: AC
  Administered 2017-04-10: 40 meq via ORAL
  Filled 2017-04-10: qty 2

## 2017-04-10 MED ORDER — SODIUM CHLORIDE 0.9 % IV BOLUS (SEPSIS)
1000.0000 mL | Freq: Once | INTRAVENOUS | Status: AC
  Administered 2017-04-10: 1000 mL via INTRAVENOUS

## 2017-04-10 MED ORDER — ONDANSETRON HCL 4 MG/2ML IJ SOLN
4.0000 mg | Freq: Once | INTRAMUSCULAR | Status: AC
  Administered 2017-04-10: 4 mg via INTRAVENOUS
  Filled 2017-04-10: qty 2

## 2017-04-10 MED ORDER — FAMOTIDINE 20 MG PO TABS
20.0000 mg | ORAL_TABLET | Freq: Two times a day (BID) | ORAL | Status: DC
  Administered 2017-04-11 (×2): 20 mg via ORAL
  Filled 2017-04-10 (×2): qty 1

## 2017-04-10 MED ORDER — ENOXAPARIN SODIUM 40 MG/0.4ML ~~LOC~~ SOLN
40.0000 mg | SUBCUTANEOUS | Status: DC
  Filled 2017-04-10: qty 0.4

## 2017-04-10 MED ORDER — AMLODIPINE BESYLATE 5 MG PO TABS
10.0000 mg | ORAL_TABLET | Freq: Once | ORAL | Status: AC
  Administered 2017-04-10: 10 mg via ORAL
  Filled 2017-04-10: qty 2

## 2017-04-10 MED ORDER — CAPSAICIN 0.025 % EX CREA
TOPICAL_CREAM | Freq: Once | CUTANEOUS | Status: AC
  Administered 2017-04-10: 16:00:00 via TOPICAL
  Filled 2017-04-10: qty 60

## 2017-04-10 MED ORDER — POTASSIUM CHLORIDE IN NACL 40-0.9 MEQ/L-% IV SOLN
INTRAVENOUS | Status: DC
  Administered 2017-04-11 (×2): 75 mL/h via INTRAVENOUS
  Filled 2017-04-10 (×2): qty 1000

## 2017-04-10 MED ORDER — ONDANSETRON 4 MG PO TBDP
ORAL_TABLET | ORAL | Status: AC
  Filled 2017-04-10: qty 1

## 2017-04-10 MED ORDER — ONDANSETRON 4 MG PO TBDP
4.0000 mg | ORAL_TABLET | Freq: Once | ORAL | Status: AC | PRN
  Administered 2017-04-10: 4 mg via ORAL

## 2017-04-10 MED ORDER — SODIUM CHLORIDE 0.9% FLUSH
3.0000 mL | Freq: Two times a day (BID) | INTRAVENOUS | Status: DC
  Administered 2017-04-11: 3 mL via INTRAVENOUS

## 2017-04-10 MED ORDER — CARVEDILOL 12.5 MG PO TABS
6.2500 mg | ORAL_TABLET | Freq: Two times a day (BID) | ORAL | Status: DC
  Administered 2017-04-11: 6.25 mg via ORAL
  Filled 2017-04-10: qty 1

## 2017-04-10 MED ORDER — PROCHLORPERAZINE EDISYLATE 5 MG/ML IJ SOLN
10.0000 mg | Freq: Four times a day (QID) | INTRAMUSCULAR | Status: DC | PRN
Start: 2017-04-10 — End: 2017-04-11

## 2017-04-10 MED ORDER — AMLODIPINE BESYLATE 10 MG PO TABS
10.0000 mg | ORAL_TABLET | Freq: Every day | ORAL | Status: DC
Start: 2017-04-11 — End: 2017-04-11
  Administered 2017-04-11: 10 mg via ORAL
  Filled 2017-04-10: qty 1

## 2017-04-10 MED ORDER — LORAZEPAM 2 MG/ML IJ SOLN: 1.0000 mg | Freq: Once | INTRAMUSCULAR | Status: DC

## 2017-04-10 MED ORDER — MAGNESIUM SULFATE 2 GM/50ML IV SOLN
2.0000 g | Freq: Once | INTRAVENOUS | Status: AC
  Administered 2017-04-10: 2 g via INTRAVENOUS
  Filled 2017-04-10: qty 50

## 2017-04-10 MED ORDER — LORAZEPAM 2 MG/ML IJ SOLN
0.5000 mg | Freq: Once | INTRAMUSCULAR | Status: AC
  Administered 2017-04-10: 0.5 mg via INTRAVENOUS
  Filled 2017-04-10: qty 1

## 2017-04-10 MED ORDER — ACETAMINOPHEN 650 MG RE SUPP: 650.0000 mg | Freq: Four times a day (QID) | RECTAL | Status: DC | PRN

## 2017-04-10 MED ORDER — LORAZEPAM 2 MG/ML IJ SOLN: 0.5000 mg | Freq: Once | INTRAMUSCULAR | Status: DC

## 2017-04-10 MED ORDER — POTASSIUM CHLORIDE CRYS ER 20 MEQ PO TBCR: 20.0000 meq | EXTENDED_RELEASE_TABLET | Freq: Two times a day (BID) | ORAL | 0 refills | Status: DC

## 2017-04-10 MED ORDER — ACETAMINOPHEN 325 MG PO TABS: 650.0000 mg | ORAL_TABLET | Freq: Four times a day (QID) | ORAL | Status: DC | PRN

## 2017-04-10 NOTE — ED Notes (Signed)
Pt. Is lying in the floor, stated, its the only way Im not going to pass out.  Reassessed  VS and call Shanda BumpsJessica, RN, charge to assign a room.  Redraw blood for Lactic Acid

## 2017-04-10 NOTE — ED Notes (Signed)
Pt had been placed on the bedpan, due to the fact that she was thought to have had a syncopal episode, while in the waiting room. Pt informed this Tech that she did NOT have a syncopal episode, while in the waiting room. Pt stated that she sat on the floor because "the floor felt better". Pt stated that she was trying to find a comfortable position and sitting on the floor felt the best to her. Informed Baird Lyonsasey - RN.

## 2017-04-10 NOTE — ED Triage Notes (Signed)
Pt endorses n/v x 6 hours pta. Pt unable to keep anything down. LBM today. Denies urinary sx or diarrhea. Pt hypertensive in triage and hx of same.

## 2017-04-10 NOTE — ED Notes (Signed)
Pt. Sweating ad stated, Im having really bad hot flashes all over.

## 2017-04-10 NOTE — Telephone Encounter (Signed)
Pt called and would like to speak to Dr. Jonathon JordanGambino about her medications making her nausea. She has not taking them this morning because she can not continue to feel this way. Please call and discuss. jw

## 2017-04-10 NOTE — ED Provider Notes (Signed)
[facility]-EMERGENCY DEPT Provider Note   CSN: [id number] Arrival date & time: 04/10/17  1204     History   Chief Complaint Chief Complaint  Patient presents with  . Nausea  . Emesis    HPI Norma Ramsey is a 32 y.o. female.  HPI   Norma Ramsey is a 31 y.o. female, with a history of HTN and obesity, presenting to the ED with nausea and vomiting beginning around 7AM this morning. Patient was getting ready for work and began to feel nauseous. She began to vomit shortly thereafter. She has vomited "too many times to count."  Endorses three previous similar episodes after being placed on HTN medication following her admission for HTN about a month ago. None of the previous episodes were this severe.  She endorses some hematuria noted for at least the last month. She is being managed by her PCP, Gambino, on this matter. An appointment for urology is being set up for her on this matter.  Denies diarrhea/constipation, hematemesis, abdominal pain, headache, fever/chills, CP, SOB, abnormal vaginal discharge/bleeding, or any other complaints.   Does not have periods due to nexplanon. Patient is a daily marijuana smoker.      Past Medical History:  Diagnosis Date  . Hypertension   . Obesity   . Tobacco abuse     Patient Active Problem List   Diagnosis Date Noted  . Nausea & vomiting 04/10/2017  . Leukocytosis 04/10/2017  . Tachycardia 04/10/2017  . Hypokalemia 04/10/2017  . Essential hypertension 03/21/2017  . Hematuria 03/08/2017  . Hypertensive urgency 03/08/2017  . Hypertensive emergency 03/08/2017  . Obesity 12/19/2016  . Tobacco abuse 12/19/2016    Past Surgical History:  Procedure Laterality Date  . WISDOM TOOTH EXTRACTION  09/2016    OB History    No data available       Home Medications    Prior to Admission medications   Medication Sig Start Date End Date Taking? Authorizing Provider  amLODipine (NORVASC) 10 MG tablet Take 1 tablet (10 mg total) by  mouth daily. 03/10/17  Yes Riccio, Marcell Anger, DO  etonogestrel (NEXPLANON) 68 MG IMPL implant 1 each by Subdermal route once.   Yes [provider]  hydrochlorothiazide (HYDRODIURIL) 25 MG tablet Take 1 tablet (25 mg total) by mouth daily. 03/17/17  Yes Beaulah Dinning, MD  potassium chloride SA (K-DUR,KLOR-CON) 20 MEQ tablet Take 1 tablet (20 mEq total) by mouth 2 (two) times daily. 04/10/17 04/15/17  Anselm Pancoast, PA-C    Family History Family History  Problem Relation Age of Onset  . Hypertension Mother   . Hypertension Father   . Diabetes Paternal Grandfather   . AAA (abdominal aortic aneurysm) Paternal Grandfather     Social History Social History  Substance Use Topics  . Smoking status: Current Every Day Smoker    Packs/day: 0.30    Types: Cigarettes    Start date: 11/26/2011  . Smokeless tobacco: Never Used  . Alcohol use 0.6 oz/week    1 Glasses of wine per week     Comment: every day     Allergies   Patient has no known allergies.   Review of Systems Review of Systems  Constitutional: Negative for chills and fever.  HENT: Negative for trouble swallowing.   Eyes: Negative for visual disturbance.  Respiratory: Negative for shortness of breath.   Cardiovascular: Negative for chest pain.  Gastrointestinal: Positive for nausea and vomiting. Negative for abdominal distention, abdominal pain, blood in stool,  constipation and diarrhea.  Genitourinary: Negative for dysuria, flank pain, vaginal bleeding and vaginal discharge.  Musculoskeletal: Negative for back pain, neck pain and neck stiffness.  Neurological: Negative for syncope, weakness, light-headedness, numbness and headaches.  All other systems reviewed and are negative.    Physical Exam Updated Vital Signs BP (!) 174/109 (BP Location: Right Arm)   Pulse (!) 102   Temp 97.9 F (36.6 C) (Oral)   Resp (!) 33   Ht 5\' 3"  (1.6 m)   Wt (!) 138.3 kg (305 lb)   SpO2 96%   BMI 54.03 kg/m   Physical  Exam  Constitutional: She appears well-developed and well-nourished. She appears distressed (nausea).  Obese female  HENT:  Head: Normocephalic and atraumatic.  Mouth/Throat: Oropharynx is clear and moist.  Eyes: Conjunctivae are normal.  Neck: Normal range of motion. Neck supple.  Cardiovascular: Normal rate, regular rhythm, normal heart sounds and intact distal pulses.   Pulmonary/Chest: Effort normal and breath sounds normal. No respiratory distress.  Abdominal: Soft. There is no tenderness. There is no guarding.  Serial abdominal exams continue to be benign.  Musculoskeletal: She exhibits no edema.  Normal motor function intact in all extremities and spine. No midline spinal tenderness.   Lymphadenopathy:    She has no cervical adenopathy.  Neurological: She is alert.  No sensory deficits. Strength 5/5 in all extremities. No gait disturbance. Coordination intact including heel to shin and finger to nose. Cranial nerves III-XII grossly intact. No facial droop.   Skin: Skin is warm and dry. She is not diaphoretic.  Psychiatric: She has a normal mood and affect. Her behavior is normal.  Nursing note and vitals reviewed.    ED Treatments / Results  Labs (all labs ordered are listed, but only abnormal results are displayed) Labs Reviewed  COMPREHENSIVE METABOLIC PANEL - Abnormal; Notable for the following:       Result Value   Potassium 2.9 (*)    Chloride 100 (*)    CO2 21 (*)    Glucose, Bld 159 (*)    BUN 5 (*)    Total Protein 8.6 (*)    Alkaline Phosphatase 139 (*)    All other components within normal limits  CBC - Abnormal; Notable for the following:    WBC 23.4 (*)    RBC 5.49 (*)    Hemoglobin 17.0 (*)    HCT 47.0 (*)    MCHC 36.2 (*)    Platelets 433 (*)    All other components within normal limits  URINALYSIS, ROUTINE W REFLEX MICROSCOPIC - Abnormal; Notable for the following:    APPearance HAZY (*)    Hgb urine dipstick SMALL (*)    Protein, ur 100 (*)     Bacteria, UA RARE (*)    Squamous Epithelial / LPF 0-5 (*)    All other components within normal limits  BASIC METABOLIC PANEL - Abnormal; Notable for the following:    Potassium 2.8 (*)    Glucose, Bld 156 (*)    BUN 5 (*)    Calcium 8.6 (*)    All other components within normal limits  CBC - Abnormal; Notable for the following:    WBC 21.2 (*)    All other components within normal limits  CBG MONITORING, ED - Abnormal; Notable for the following:    Glucose-Capillary 172 (*)    All other components within normal limits  I-STAT CG4 LACTIC ACID, ED - Abnormal; Notable for the following:  Lactic Acid, Venous 3.78 (*)    All other components within normal limits  I-STAT CG4 LACTIC ACID, ED - Abnormal; Notable for the following:    Lactic Acid, Venous 2.88 (*)    All other components within normal limits  LIPASE, BLOOD  MAGNESIUM  CBC  COMPREHENSIVE METABOLIC PANEL  HEMOGLOBIN A1C  I-STAT BETA HCG BLOOD, ED ([facility], [facility], [facility] ONLY)    EKG  EKG Interpretation  Date/Time:  Monday April 10 2017 14:33:22 EDT Ventricular Rate:  92 PR Interval:    QRS Duration: 107 QT Interval:  433 QTC Calculation: 536 R Axis:   71 Text Interpretation:  Sinus rhythm Non-specific ST-t changes Prolonged QT interval No old tracing to compare Confirmed by Raeford RazorKohut, Stephen [phone]([id number]) on 04/10/2017 2:45:06 PM        Radiology No results found.  Procedures Procedures (including critical care time)  Medications Ordered in ED Medications  amLODipine (NORVASC) tablet 10 mg (not administered)  ondansetron (ZOFRAN-ODT) disintegrating tablet 4 mg (4 mg Oral Given 04/10/17 1218)  ondansetron (ZOFRAN) injection 4 mg (4 mg Intravenous Given 04/10/17 1457)  sodium chloride 0.9 % bolus 1,000 mL (0 mLs Intravenous Stopped 04/10/17 1701)    Followed by  sodium chloride 0.9 % bolus 1,000 mL (0 mLs Intravenous Stopped 04/10/17 1701)  capsaicin (ZOSTRIX) 0.025 % cream ( Topical Given 04/10/17 1557)  magnesium sulfate IVPB 2 g  50 mL (0 g Intravenous Stopped 04/10/17 1701)  potassium chloride SA (K-DUR,KLOR-CON) CR tablet 40 mEq (40 mEq Oral Given 04/10/17 1737)  LORazepam (ATIVAN) injection 0.5 mg (0.5 mg Intravenous Given 04/10/17 2019)  sodium chloride 0.9 % bolus 1,000 mL (1,000 mLs Intravenous New Bag/Given 04/10/17 2025)     Initial Impression / Assessment and Plan / ED Course  I have reviewed the triage vital signs and the nursing notes.  Pertinent labs & imaging results that were available during my care of the patient were reviewed by me and considered in my medical decision making (see chart for details).  Clinical Course as of Apr 10 2038  Mon Apr 10, 2017  1419 Attempted to make patient contact. Patient not in room.  [SJ]  1615 Patient voices resolution in her N/V following the application of capsaicin. Patient is requesting something to drink.  [SJ]  1701 Patient continues to voice resolution in her symptoms.   [SJ]  1945 Notified that patient became very anxious prior to discharge. She became nauseous, started to hyperventilate, became tachycardic, and vomited.  Patient's blood pressure noted to be elevated now. States she hasn't taken her blood pressure medications today. Denies HA, SOB, CP, dizziness, vision changes, or any other symptoms of hypertensive emergency.  [SJ]  2025 Spoke with Dr. Selena BattenKim, hospitalist, who agreed to admit the patient.   [SJ]    Clinical Course User Index [SJ] Dalin Caldera C, PA-C    Patient presents with nausea and vomiting. Suspect THC hyperemesis. Also suspect this may have been exacerbated by the inclusion of a diuretic in the patient's medication regimen and the decrease in the patient's water intake. Hypokalemia noted and addressed. Patient rehydrated. Prolonged QT interval noted and addressed.  Symptoms initially resolved with topical capsaicin over the epigastrium.  Lactic acidosis and leukocytosis improving over ED course. QTc improved from 536 to 464.   Patient was set to  be discharged, but again began vomiting. Patient's blood pressure again noted to be increased. Ordered her home Norvasc, but held her home HCTZ due to Hypokalemia and QT prolongation.  Will need admission due to intractable vomiting.   Findings and plan of care discussed with Raeford Razor, MD.   Vitals:   04/10/17 1213 04/10/17 1403 04/10/17 1430 04/10/17 1515  BP: (!) 158/116 (!) 174/109 (!) 167/112   Pulse: (!) 108 (!) 102 (!) 102 97  Resp: 18 (!) 33  (!) 26  Temp: 98.6 F (37 C) 97.9 F (36.6 C)    TempSrc: Oral Oral    SpO2: 100% 96% 100% 96%  Weight: (!) 138.3 kg (305 lb)     Height: 5\' 3"  (1.6 m)      Vitals:   04/10/17 1630 04/10/17 1645 04/10/17 1918 04/10/17 1945  BP: (!) 152/100 (!) 135/92 139/89 (!) 162/87  Pulse: 93 85 98 (!) 105  Resp: 14 20 19 16   Temp:      TempSrc:      SpO2: 100% 99% 99% 99%  Weight:      Height:         Final Clinical Impressions(s) / ED Diagnoses   Final diagnoses:  Prolonged QT interval  Cannabinoid hyperemesis syndrome (HCC)  Hypokalemia  Intractable vomiting with nausea, unspecified vomiting type    New Prescriptions New Prescriptions   POTASSIUM CHLORIDE SA (K-DUR,KLOR-CON) 20 MEQ TABLET    Take 1 tablet (20 mEq total) by mouth 2 (two) times daily.     Anselm Pancoast, PA-C 04/10/17 1830    Anselm Pancoast, PA-C 04/10/17 2039    Raeford Razor, MD 04/11/17 [phone]

## 2017-04-10 NOTE — ED Notes (Signed)
I stat lactic acid results given to Dr. Juleen ChinaKohut by B. Bing PlumeHaynes, EMT

## 2017-04-10 NOTE — H&P (Addendum)
TRH H&P   Patient Demographics:    Norma Ramsey, is a 31 y.o. female  MRN: [id number]   DOB - [birth year]  Admit Date - 04/10/2017  Outpatient Primary MD for the patient is Juanito Doom, Arlie Solomons, MD  Referring MD/NP/PA: Arlean Hopping  Outpatient Specialists:    Patient coming from:  home  Chief Complaint  Patient presents with  . Nausea  . Emesis      HPI:    Norma Ramsey  is a 31 y.o. female, w hypertension, obeisity, microscopic hematuria, w recent admission for hypertensive urgency 6/6 apparently returns due to n/v.  Persistent since this am while getting ready for work.  no blood.  Denies fever, chills, abd pain, diarrhea, brbpr, black stool.  No heartburn.  Pt notes eating a ground hamburger. Wonders if this might have had something to do with her symptoms.    In ED,  Pt continued to have n/v.  Wbc 21.1,  Hgb 14.8,  K =2.8,  Glucose 156,  Lactic acid 3.78.  Alk phos elevation 139  +  Microscopic hematuria.  And Pt will be admitted for intractable n/v.      Review of systems:    In addition to the HPI above,  No Fever-chills, No Headache, No changes with Vision or hearing, No problems swallowing food or Liquids, No Chest pain, Cough or Shortness of Breath, No Abdominal pain, , Bowel movements are regular, No Blood in stool or Urine, No dysuria, No new skin rashes or bruises, No new joints pains-aches,  No new weakness, tingling, numbness in any extremity, No recent weight gain or loss, No polyuria, polydypsia or polyphagia, No significant Mental Stressors.  A full 10 point Review of Systems was done, except as stated above, all other Review of Systems were negative.   With Past History of the following :    Past Medical History:  Diagnosis Date  . Hypertension   . Obesity   . Tobacco abuse       Past Surgical History:  Procedure Laterality Date  .  WISDOM TOOTH EXTRACTION  09/2016      Social History:     Social History  Substance Use Topics  . Smoking status: Current Every Day Smoker    Packs/day: 0.30    Types: Cigarettes    Start date: 11/26/2011  . Smokeless tobacco: Never Used  . Alcohol use 0.6 oz/week    1 Glasses of wine per week     Comment: every day     Lives - at home w parents  Mobility - walks by self   Family History :     Family History  Problem Relation Age of Onset  . Hypertension Mother   . Hypertension Father   . Diabetes Paternal Grandfather   . AAA (abdominal aortic aneurysm) Paternal Grandfather       Home Medications:  Prior to Admission medications   Medication Sig Start Date End Date Taking? Authorizing Provider  amLODipine (NORVASC) 10 MG tablet Take 1 tablet (10 mg total) by mouth daily. 03/10/17  Yes Riccio, Gardiner Rhyme, DO  etonogestrel (NEXPLANON) 68 MG IMPL implant 1 each by Subdermal route once.   Yes [provider]  hydrochlorothiazide (HYDRODIURIL) 25 MG tablet Take 1 tablet (25 mg total) by mouth daily. 03/17/17  Yes Carlyle Dolly, MD  potassium chloride SA (K-DUR,KLOR-CON) 20 MEQ tablet Take 1 tablet (20 mEq total) by mouth 2 (two) times daily. 04/10/17 04/15/17  Joy, Helane Gunther, PA-C     Allergies:    No Known Allergies   Physical Exam:   Vitals  Blood pressure (!) 162/87, pulse (!) 105, temperature 98.7 F (37.1 C), temperature source Oral, resp. rate 16, height '5\' 3"'$  (1.6 m), weight (!) 138.3 kg (305 lb), SpO2 99 %.   1. General  lying in bed in NAD,    2. Normal affect and insight, Not Suicidal or Homicidal, Awake Alert, Oriented X 3.  3. No F.N deficits, ALL C.Nerves Intact, Strength 5/5 all 4 extremities, Sensation intact all 4 extremities, Plantars down going.  4. Ears and Eyes appear Normal, Conjunctivae clear, PERRLA. Moist Oral Mucosa.  5. Supple Neck, No JVD, No cervical lymphadenopathy appriciated, No Carotid Bruits.  6. Symmetrical Chest  wall movement, Good air movement bilaterally, CTAB.  7. RRR, No Gallops, Rubs or Murmurs, No Parasternal Heave.  8. Positive Bowel Sounds, Abdomen Soft, No tenderness, No organomegaly appriciated,No rebound -guarding or rigidity.  9.  No Cyanosis, Normal Skin Turgor, No Skin Rash or Bruise.  10. Good muscle tone,  joints appear normal , no effusions, Normal ROM.  11. No Palpable Lymph Nodes in Neck or Axillae     Data Review:    CBC  Recent Labs Lab 04/10/17 1217 04/10/17 1728  WBC 23.4* 21.2*  HGB 17.0* 14.8  HCT 47.0* 41.3  PLT 433* 383  MCV 85.6 86.2  MCH 31.0 30.9  MCHC 36.2* 35.8  RDW 12.7 12.7   ------------------------------------------------------------------------------------------------------------------  Chemistries   Recent Labs Lab 04/10/17 1217 04/10/17 1728  NA 136 138  K 2.9* 2.8*  CL 100* 104  CO2 21* 24  GLUCOSE 159* 156*  Ramsey 5* 5*  CREATININE 0.76 0.73  CALCIUM 9.6 8.6*  MG 1.8  --   AST 36  --   ALT 43  --   ALKPHOS 139*  --   BILITOT 0.8  --    ------------------------------------------------------------------------------------------------------------------ estimated creatinine clearance is 142.2 mL/min (by C-G formula based on SCr of 0.73 mg/dL). ------------------------------------------------------------------------------------------------------------------ No results for input(s): TSH, T4TOTAL, T3FREE, THYROIDAB in the last 72 hours.  Invalid input(s): FREET3  Coagulation profile No results for input(s): INR, PROTIME in the last 168 hours. ------------------------------------------------------------------------------------------------------------------- No results for input(s): DDIMER in the last 72 hours. -------------------------------------------------------------------------------------------------------------------  Cardiac Enzymes No results for input(s): CKMB, TROPONINI, MYOGLOBIN in the last 168 hours.  Invalid  input(s): CK ------------------------------------------------------------------------------------------------------------------ No results found for: BNP   ---------------------------------------------------------------------------------------------------------------  Urinalysis    Component Value Date/Time   COLORURINE YELLOW 04/10/2017 1226   APPEARANCEUR HAZY (A) 04/10/2017 1226   LABSPEC 1.018 04/10/2017 1226   PHURINE 8.0 04/10/2017 1226   GLUCOSEU NEGATIVE 04/10/2017 1226   HGBUR SMALL (A) 04/10/2017 1226   BILIRUBINUR NEGATIVE 04/10/2017 1226   BILIRUBINUR small (A) 03/17/2017 1654   KETONESUR NEGATIVE 04/10/2017 1226   PROTEINUR 100 (A) 04/10/2017 1226   UROBILINOGEN >=8.0 (A)  03/17/2017 1654   NITRITE NEGATIVE 04/10/2017 1226   LEUKOCYTESUR NEGATIVE 04/10/2017 1226    ----------------------------------------------------------------------------------------------------------------   Imaging Results:    No results found.     Assessment & Plan:    Principal Problem:   Nausea & vomiting Active Problems:   Tobacco abuse   Hematuria   Essential hypertension   Leukocytosis   Tachycardia   Hypokalemia    N/v, alkaline phosphatase elevation CXR pepcid '20mg'$  po bid Zofran Phenergan Check RUQ ultrasound If w/up negative and nausea vomitting continues consider r/o pseudotumor cerebri  Leukocytosis secondary to n/v Check CXR as above   Hypokalemia Ns w 40 meq K iv Hold hydrochlorothiazide Check cmp in am  Hypertension uncontrolled Continue amlodipine  Start Carvedilol 6.'25mg'$  po bid  Tachycardia Add d dimer , if positive then consider CTAchest or  VQ scan Check TSH Check cardiac echo  Hyperglycemia Check hga1c  Nicotine dep Nicotine patch prn Pt counselled on smoking cessation x 37mnutes   Microscopic hematuria, prior renal ultrasound 6/7=> negative Outpatient urology f/u please   DVT Prophylaxis  Lovenox - SCDs  AM Labs Ordered, also  please review Full Orders  Family Communication: Admission, patients condition and plan of care including tests being ordered have been discussed with the patient  who indicate understanding and agree with the plan and Code Status.  Code Status FULL CODE  Likely DC to  home  Condition GUARDED   Consults called: none  Admission status: observation  Time spent in minutes : 45 minutes   JJani GravelM.D on 04/10/2017 at 8:39 PM  Between 7am to 7pm - Pager - [phone] After 7pm go to [email or web address] - password [facility] - Office  [phone]

## 2017-04-10 NOTE — Telephone Encounter (Signed)
Patient's sister call back stating patient with N/V again today. States usually wakes in AM feeling "off", takes BP med with snack and by lunch time is vomiting and unable to keep even liquids down. Patient given SD appt today at 2:10 with Dr. Alanda SlimGonfa to discuss. Kinnie FeilL. Daymeon Fischman, RN, BSN

## 2017-04-10 NOTE — ED Notes (Signed)
Patient transported to X-ray 

## 2017-04-10 NOTE — ED Notes (Signed)
Patient transported to Ultrasound 

## 2017-04-11 ENCOUNTER — Observation Stay (HOSPITAL_BASED_OUTPATIENT_CLINIC_OR_DEPARTMENT_OTHER): Payer: Medicaid Other

## 2017-04-11 DIAGNOSIS — I10 Essential (primary) hypertension: Secondary | ICD-10-CM | POA: Diagnosis not present

## 2017-04-11 DIAGNOSIS — F12988 Cannabis use, unspecified with other cannabis-induced disorder: Secondary | ICD-10-CM | POA: Diagnosis not present

## 2017-04-11 DIAGNOSIS — R000 Tachycardia, unspecified: Secondary | ICD-10-CM

## 2017-04-11 DIAGNOSIS — R3121 Asymptomatic microscopic hematuria: Secondary | ICD-10-CM

## 2017-04-11 DIAGNOSIS — R112 Nausea with vomiting, unspecified: Secondary | ICD-10-CM | POA: Diagnosis not present

## 2017-04-11 DIAGNOSIS — D72829 Elevated white blood cell count, unspecified: Secondary | ICD-10-CM

## 2017-04-11 DIAGNOSIS — Z720 Tobacco use: Secondary | ICD-10-CM

## 2017-04-11 DIAGNOSIS — E876 Hypokalemia: Secondary | ICD-10-CM

## 2017-04-11 LAB — ECHOCARDIOGRAM COMPLETE
AVLVOTPG: 4 mmHg
E decel time: 211 msec
EERAT: 9.97
FS: 30 % (ref 28–44)
Height: 62 in
IVS/LV PW RATIO, ED: 0.93
LA diam end sys: 36 mm
LADIAMINDEX: 1.58 cm/m2
LASIZE: 36 mm
LAVOL: 43.3 mL
LAVOLA4C: 41.5 mL
LAVOLIN: 19 mL/m2
LDCA: 3.14 cm2
LV e' LATERAL: 9.25 cm/s
LVEEAVG: 9.97
LVEEMED: 9.97
LVOT SV: 56 mL
LVOT VTI: 17.8 cm
LVOT diameter: 20 mm
LVOT peak vel: 105 cm/s
Lateral S' vel: 11.5 cm/s
MV Dec: 211
MVPG: 3 mmHg
MVPKAVEL: 51.4 m/s
MVPKEVEL: 92.2 m/s
PW: 12.2 mm — AB (ref 0.6–1.1)
RV TAPSE: 17.5 mm
TDI e' lateral: 9.25
TDI e' medial: 7.29
Weight: 4838.4 oz

## 2017-04-11 LAB — LACTIC ACID, PLASMA: Lactic Acid, Venous: 1.1 mmol/L (ref 0.5–1.9)

## 2017-04-11 LAB — DIFFERENTIAL
BASOS ABS: 0 10*3/uL (ref 0.0–0.1)
BASOS PCT: 0 %
Eosinophils Absolute: 0 10*3/uL (ref 0.0–0.7)
Eosinophils Relative: 0 %
Lymphocytes Relative: 16 %
Lymphs Abs: 3.4 10*3/uL (ref 0.7–4.0)
Monocytes Absolute: 1.4 10*3/uL — ABNORMAL HIGH (ref 0.1–1.0)
Monocytes Relative: 7 %
NEUTROS ABS: 16.7 10*3/uL — AB (ref 1.7–7.7)
NEUTROS PCT: 77 %

## 2017-04-11 LAB — CBC
HCT: 39.6 % (ref 36.0–46.0)
HEMOGLOBIN: 13.9 g/dL (ref 12.0–15.0)
MCH: 30.1 pg (ref 26.0–34.0)
MCHC: 35.1 g/dL (ref 30.0–36.0)
MCV: 85.7 fL (ref 78.0–100.0)
Platelets: 361 10*3/uL (ref 150–400)
RBC: 4.62 MIL/uL (ref 3.87–5.11)
RDW: 13 % (ref 11.5–15.5)
WBC: 22 10*3/uL — ABNORMAL HIGH (ref 4.0–10.5)

## 2017-04-11 LAB — COMPREHENSIVE METABOLIC PANEL
ALBUMIN: 3.3 g/dL — AB (ref 3.5–5.0)
ALK PHOS: 103 U/L (ref 38–126)
ALT: 28 U/L (ref 14–54)
ANION GAP: 8 (ref 5–15)
AST: 19 U/L (ref 15–41)
BUN: <5 mg/dL — ABNORMAL LOW (ref 6–20)
CALCIUM: 8.3 mg/dL — AB (ref 8.9–10.3)
CHLORIDE: 107 mmol/L (ref 101–111)
CO2: 23 mmol/L (ref 22–32)
CREATININE: 0.55 mg/dL (ref 0.44–1.00)
GFR calc Af Amer: >60 mL/min (ref >60)
GFR calc non Af Amer: >60 mL/min (ref >60)
GLUCOSE: 121 mg/dL — AB (ref 65–99)
Potassium: 3.1 mmol/L — ABNORMAL LOW (ref 3.5–5.1)
SODIUM: 138 mmol/L (ref 135–145)
Total Bilirubin: 0.9 mg/dL (ref 0.3–1.2)
Total Protein: 6.4 g/dL — ABNORMAL LOW (ref 6.5–8.1)

## 2017-04-11 MED ORDER — ZOLPIDEM TARTRATE 5 MG PO TABS
5.0000 mg | ORAL_TABLET | Freq: Once | ORAL | Status: AC
  Administered 2017-04-11: 5 mg via ORAL
  Filled 2017-04-11: qty 1

## 2017-04-11 MED ORDER — ONDANSETRON 4 MG PO TBDP: 4.0000 mg | ORAL_TABLET | Freq: Three times a day (TID) | ORAL | 0 refills | Status: DC | PRN

## 2017-04-11 MED ORDER — ENOXAPARIN SODIUM 80 MG/0.8ML ~~LOC~~ SOLN: 70.0000 mg | SUBCUTANEOUS | Status: DC

## 2017-04-11 NOTE — Care Management Note (Addendum)
Case Management Note  Patient Details  Name: Norma Ramsey MRN: [id number] Date of Birth: [birth year]  Subjective/Objective:            Presents with  intractable n/v,  hx of  hypertension, obeisity, microscopic hematuria,  recent admission for hypertensive urgency 6/6.  Resides with parents. Independent with ADL's and DME usage.  PCP: Anders Simmondshristina Gambino  Action/Plan: Plan is to d/c to home today. No needs identified per CM.  Expected Discharge Date:    04/11/2017           Expected Discharge Plan:  Home/Self Care  In-House Referral:     Discharge planning Services  CM Consult  Status of Service:  Completed, signed off  If discussed at Long Length of Stay Meetings, dates discussed:    Additional Comments:  Epifanio LeschesCole, [name], RN 04/11/2017, 11:38 AM

## 2017-04-11 NOTE — Progress Notes (Signed)
Patient discharge teaching given, including activity, diet, follow-up appoints, and medications. Patient verbalized understanding of all discharge instructions. IV access was d/c'd. Vitals are stable. Skin is intact except as charted in most recent assessments. Pt to be escorted out by NT, to be driven home by family. 

## 2017-04-11 NOTE — Discharge Summary (Signed)
Physician Discharge Summary  Norma Ramsey HEN:[id number] DOB: [birth year] DOA: 04/10/2017  PCP: Carlyle Dolly, MD  Admit date: 04/10/2017 Discharge date: 04/11/2017  Admitted From: Home Disposition: Home  Recommendations for Outpatient Follow-up:  1. Follow up with PCP in 1 week 2. Please obtain BMP in 2-3 days to recheck potassium 3. Please obtain CBC in 2-3 days to recheck WBC 4. Please follow up on the following pending results: Echocardiogram, pathology smear of urine and blood  Home Health: None Equipment/Devices: None  Discharge Condition: Stable CODE STATUS: Full code Diet recommendation: Heart healthy   Brief/Interim Summary:  Admission HPI written by Jani Gravel, MD   HPI:   Norma Ramsey  is a 31 y.o. female, w hypertension, obeisity, microscopic hematuria, w recent admission for hypertensive urgency 6/6 apparently returns due to n/v.  Persistent since this am while getting ready for work.  no blood.  Denies fever, chills, abd pain, diarrhea, brbpr, black stool.  No heartburn.  Pt notes eating a ground hamburger. Wonders if this might have had something to do with her symptoms.    In ED,  Pt continued to have n/v.  Wbc 21.1,  Hgb 14.8,  K =2.8,  Glucose 156,  Lactic acid 3.78.  Alk phos elevation 139  +  Microscopic hematuria.  And Pt will be admitted for intractable n/v.       Hospital course:  Nausea and vomiting Initially intractable. Resolved overnight with antiemetics. Right upper quadrant ultrasound was obtained and was negative for pathology. Possibly contributed to by chronic THC use.  Leukocytosis Possibly secondary to nausea and vomiting. Smoking history could also be contributing to leukocytosis. Has remained stable. No evidence of infection. Appears isolated. Blood pathology smear pending and will need outpatient follow-up.   Hypokalemia Secondary to nausea and vomiting. Patient was given potassium containing fluids improved potassium.  Hydrochlorothiazide was discontinued on discharge. This will need to be readdressed as an outpatient. Recheck BMP soon for potassium.  Hypertension Continued home amlodipine. Initially given a dose of carvedilol but this will not be continued on discharge. Patient had episodes of elevated and normotensive blood pressures. Recommend continued monitoring as an outpatient. Hydrochlorothiazide discontinued on discharge secondary to hypokalemia as mentioned above. May restart pending follow-up.  Smoking history Patient counseled on admission. Given a nicotine patch as needed while in the hospital.  Microscopic hematuria Patient has been followed up by her primary care physician. There are plans to have her follow with urology as an outpatient. No mention of casts on urinalysis. Ordered a pathology smear on her urine. No gross hematuria.  Tachycardia Secondary to volume depletion in setting of intractable nausea and vomiting. Tachycardia resolved with IV fluid resuscitation. Echocardiogram obtained on admission. Not read prior to discharge. Outpatient follow-up.  Heart murmur Systolic. Likely benign. Echocardiogram pending.   Discharge Diagnoses:  Principal Problem:   Nausea & vomiting Active Problems:   Tobacco abuse   Hematuria   Essential hypertension   Leukocytosis   Tachycardia   Hypokalemia    Discharge Instructions   Allergies as of 04/11/2017   No Known Allergies     Medication List    STOP taking these medications   hydrochlorothiazide 25 MG tablet Commonly known as:  HYDRODIURIL     TAKE these medications   amLODipine 10 MG tablet Commonly known as:  NORVASC Take 1 tablet (10 mg total) by mouth daily.   NEXPLANON 68 MG Impl implant Generic drug:  etonogestrel 1 each by Subdermal route once.  ondansetron 4 MG disintegrating tablet Commonly known as:  ZOFRAN ODT Take 1 tablet (4 mg total) by mouth every 8 (eight) hours as needed for nausea or vomiting.       Follow-up Information    Carlyle Dolly, MD .   Specialty:  [facility] Medicine Contact information: [place] [address] [phone]          No Known Allergies  Consultations:  None   Procedures/Studies: Dg Chest 2 View  Result Date: 04/10/2017 CLINICAL DATA:  Leukocytosis. EXAM: CHEST  2 VIEW COMPARISON:  None. FINDINGS: The cardiomediastinal contours are normal. The lungs are clear. Pulmonary vasculature is normal. No consolidation, pleural effusion, or pneumothorax. No acute osseous abnormalities are seen. IMPRESSION: No acute abnormality or explanation for leukocytosis. Electronically Signed   By: Jeb Levering M.D.   On: 04/10/2017 21:16   US Abdomen Limited Ruq  Result Date: 04/10/2017 CLINICAL DATA:  Nausea and vomiting EXAM: ULTRASOUND ABDOMEN LIMITED RIGHT UPPER QUADRANT COMPARISON:  None. FINDINGS: Gallbladder: No gallstones or wall thickening visualized. No sonographic Murphy sign noted by sonographer. Common bile duct: Diameter: 3 mm Liver: No focal lesion identified. Within normal limits in parenchymal echogenicity. IMPRESSION: Negative right upper quadrant abdominal ultrasound Electronically Signed   By: Donavan Foil M.D.   On: 04/10/2017 22:25     Subjective: No murmur nausea or vomiting.  Discharge Exam: Vitals:   04/10/17 2304 04/11/17 0511  BP: 134/81 134/75  Pulse: 84 88  Resp: 18 18  Temp: 98.3 F (36.8 C) 98.2 F (36.8 C)   Vitals:   04/10/17 2154 04/10/17 2304 04/10/17 2313 04/11/17 0511  BP:  134/81  134/75  Pulse: 92 84  88  Resp: (!) '21 18  18  '$ Temp:  98.3 F (36.8 C)  98.2 F (36.8 C)  TempSrc:  Oral  Oral  SpO2: 98% 100%  100%  Weight:   (!) 138.8 kg (305 lb 14.4 oz) (!) 137.2 kg (302 lb 6.4 oz)  Height:   '5\' 2"'$  (1.575 m)     General: Pt is alert, awake, not in acute distress Cardiovascular: RRR, G5/X6 +, 1/6 systolic murmur, no rubs, no gallops Respiratory: CTA bilaterally, no wheezing, no  rhonchi Abdominal: Soft, NT, ND, bowel sounds + Extremities: no edema, no cyanosis    The results of significant diagnostics from this hospitalization (including imaging, microbiology, ancillary and laboratory) are listed below for reference.     Microbiology: No results found for this or any previous visit (from the past 240 hour(s)).   Labs: BNP (last 3 results) No results for input(s): BNP in the last 8760 hours. Basic Metabolic Panel:  Recent Labs Lab 04/10/17 1217 04/10/17 1728 04/11/17 0450  NA 136 138 138  K 2.9* 2.8* 3.1*  CL 100* 104 107  CO2 21* 24 23  GLUCOSE 159* 156* 121*  BUN 5* 5* <5*  CREATININE 0.76 0.73 0.55  CALCIUM 9.6 8.6* 8.3*  MG 1.8  --   --    Liver Function Tests:  Recent Labs Lab 04/10/17 1217 04/11/17 0450  AST 36 19  ALT 43 28  ALKPHOS 139* 103  BILITOT 0.8 0.9  PROT 8.6* 6.4*  ALBUMIN 4.3 3.3*    Recent Labs Lab 04/10/17 1217  LIPASE 32   No results for input(s): AMMONIA in the last 168 hours. CBC:  Recent Labs Lab 04/10/17 1217 04/10/17 1728 04/11/17 0450  WBC 23.4* 21.2* 22.0*  NEUTROABS  --   --  16.7*  HGB 17.0* 14.8 13.9  HCT 47.0* 41.3 39.6  MCV 85.6 86.2 85.7  PLT 433* 383 361   Cardiac Enzymes: No results for input(s): CKTOTAL, CKMB, CKMBINDEX, TROPONINI in the last 168 hours. BNP: Invalid input(s): POCBNP CBG:  Recent Labs Lab 04/10/17 1224  GLUCAP 172*   D-Dimer  Recent Labs  04/10/17 2311  DDIMER 0.43   Hgb A1c No results for input(s): HGBA1C in the last 72 hours. Lipid Profile No results for input(s): CHOL, HDL, LDLCALC, TRIG, CHOLHDL, LDLDIRECT in the last 72 hours. Thyroid function studies No results for input(s): TSH, T4TOTAL, T3FREE, THYROIDAB in the last 72 hours.  Invalid input(s): FREET3 Anemia work up No results for input(s): VITAMINB12, FOLATE, FERRITIN, TIBC, IRON, RETICCTPCT in the last 72 hours. Urinalysis    Component Value Date/Time   COLORURINE YELLOW 04/10/2017  1226   APPEARANCEUR HAZY (A) 04/10/2017 1226   LABSPEC 1.018 04/10/2017 1226   PHURINE 8.0 04/10/2017 1226   GLUCOSEU NEGATIVE 04/10/2017 1226   HGBUR SMALL (A) 04/10/2017 1226   BILIRUBINUR NEGATIVE 04/10/2017 1226   BILIRUBINUR small (A) 03/17/2017 1654   KETONESUR NEGATIVE 04/10/2017 1226   PROTEINUR 100 (A) 04/10/2017 1226   UROBILINOGEN >=8.0 (A) 03/17/2017 1654   NITRITE NEGATIVE 04/10/2017 1226   LEUKOCYTESUR NEGATIVE 04/10/2017 1226     SIGNED:   Cordelia Poche, MD [facility] 04/11/2017, 1:44 PM Pager [phone]  If 7PM-7AM, please contact night-coverage [email or web address] Password [email or web address]

## 2017-04-11 NOTE — Discharge Instructions (Addendum)
Kamorah B Joelene MillinOliver,  You were watched in the hospital because of your nausea and vomiting. You were given medications which appear to have resolved your symptoms. You have a history of blood in your urine. Please follow-up with the urologist as your primary care physician has already recommended. You're also found to have a high inflammatory cell count. You have some tests that are pending and your primary care physician can follow up with these tests.

## 2017-04-11 NOTE — Progress Notes (Signed)
Pt arrived to 5w17, alert and oriented x 4, VS stable, no signs of acute distress. No family at the bedside, pt ambulated from stretcher to bed, denied nausea at this time.  Pt identified properly, cardiac monitor placed on pt upon arrival.  Pt oriented to room and equipment, instructed to call for assistance and call light left with in pt reach.

## 2017-04-11 NOTE — Progress Notes (Signed)
  Echocardiogram 2D Echocardiogram has been performed.  Norma Ramsey, Norma Ramsey 04/11/2017, 10:14 AM

## 2017-04-12 LAB — HEMOGLOBIN A1C
HEMOGLOBIN A1C: 6.2 % — AB (ref 4.8–5.6)
MEAN PLASMA GLUCOSE: 131 mg/dL

## 2017-04-12 NOTE — Progress Notes (Signed)
Subjective:    Patient ID: Norma Ramsey , female   DOB: [birth year] , 31 y.o..   MRN: [id number]  HPI  Norma Ramsey is A 31 year old female with past medical history of hypertension, obesity, tobacco abuse, microscopic hematuria here for   1. Hospital Follow up for intractable nausea and vomiting [facility] to be [facility]:  Brief Hospital course: Patient [facility] for [facility]ractable N[facility]. Right upper quadrant ultrasound was negative. Patient was given antiemetics and fluids which improved her nausea and vomiting. She was also noted to be hypokalemic and was given potassium in her fluids. Her hydrochlorothiazide was discontinued as it was thought that it may be contributing to her low potassium.  Since leaving the hospital, patient States that her nausea and vomiting have resolved. She has been able to tolerate solids and liquids without difficulty. She has been trying to keep hydrated by drinking at least 4 water bottles a day. She admits to smoking marijuana frequently and does not think that this contributed to her nausea and vomiting.  2. Concerns for 2 Nexplanon's in place: Patient mentioned that she is worried about having 2 Nexplanon's in her left arm. She notes that the first one [facility] in [facility]. She went back for a second one in 2016 and the doctor at that time was not able to remove the first one. Therefore she had to Nexplanon's in place since 2016.   Review of Systems: Per HPI.    Past Medical History: Patient Active Problem List   Diagnosis Date Noted  . Nexplanon in place 04/13/2017  . Nausea & vomiting 04/10/2017  . Leukocytosis 04/10/2017  . Hypokalemia 04/10/2017  . Essential hypertension 03/21/2017  . Hematuria 03/08/2017  . Obesity 12/19/2016  . Tobacco abuse 12/19/2016    Medications: reviewed and updated Current Outpatient Prescriptions  Medication Sig Dispense Refill  . amLODipine (NORVASC) 10 MG tablet TAKE 1  TABLET BY MOUTH DAILY 30 tablet 0  . etonogestrel (NEXPLANON) 68 MG IMPL implant 1 each by Subdermal route once.    . ondansetron (ZOFRAN ODT) 4 MG disintegrating tablet Take 1 tablet (4 mg total) by mouth every 8 (eight) hours as needed for nausea or vomiting. 20 tablet 0   No current facility-administered medications for this visit.     Social Hx:  reports that she has been smoking Cigarettes.  She started smoking about 5 years ago. She has been smoking about 0.30 packs per day. She has never used smokeless tobacco.   Objective:   BP 128/70   Pulse 90   Temp 98.5 F (36.9 C) (Oral)   Ht 5\' 3"  (1.6 m)   Wt (!) 305 lb (138.3 kg)   LMP  (LMP Unknown)   SpO2 98%   BMI 54.03 kg/m  Physical Exam  Gen: NAD, alert, cooperative with exam, well-appearing, morbidly obese HEENT: NCAT, PERRL, clear conjunctiva, oropharynx clear, supple neck Cardiac: Regular rate and rhythm, normal S1/S2, no murmur, no edema, capillary refill brisk  Respiratory: Clear to auscultation bilaterally, no wheezes, non-labored breathing Gastrointestinal: soft, non tender, non distended, bowel sounds present Skin: no rashes, normal turgor, 1 Nexplanon palpable in left upper arm Neurological: no gross deficits.  Psych: good insight, normal mood and affect  Assessment & Plan:  Essential hypertension Controlled and at goal with just amlodipine. Hydrochlorothiazide was discontinued at recent hospitalization -Continue amlodipine 10 mg daily  -continue to check blood pressure at home, discussed goal blood  pressure -Return to the clinic 3 months  Nausea & vomiting Resolved. Thought to be secondary to [facility] use. However there is a possibility if this was infectious in etiology such as viral gastroenteritis that she did have a leukocytosis. Right upper quadrant ultrasound normal. -Continue to keep well-hydrated -Repeat CBC and BMP today  Leukocytosis Unclear etiology. Apparently's blood smear was supposed to be for  performed in hospital but this was not done. -We will recheck CBC today -May consider performing outpatient peripheral smear if she continues to have leukocytosis  Nexplanon in place Patient reports that she actually has 2 Nexplanon's. 1 in 2013 and one in 2016. Physician was unable to remove the first one when placing the second. Only 1 Nexplanon was able to be palpated on exam today. -We'll get left humerus x-ray to confirm 2 Nexplanon's -May need to refer to surgery if she does indeed have 2 Nexplanon's so she can have those removed  Orders Placed This Encounter  Procedures  . DG Humerus Left    Standing Status:   Future    Standing Expiration Date:   06/14/2018    Order Specific Question:   Reason for Exam (SYMPTOM  OR DIAGNOSIS REQUIRED)    Answer:   patient has 2 nexplanons in place, unable to palpate    Order Specific Question:   Is patient pregnant?    Answer:   No    Order Specific Question:   Preferred imaging location?    Answer:   [facility]    Order Specific Question:   Radiology Contrast Protocol - do NOT remove file path    Answer:   \\[email or web address]  . CBC with Differential  . Comprehensive metabolic panel   Anders Simmonds, MD [facility], PGY-3

## 2017-04-13 ENCOUNTER — Encounter: Payer: Self-pay | Admitting: Family Medicine

## 2017-04-13 ENCOUNTER — Ambulatory Visit (INDEPENDENT_AMBULATORY_CARE_PROVIDER_SITE_OTHER): Payer: Medicaid Other | Admitting: Family Medicine

## 2017-04-13 VITALS — BP 128/70 | HR 90 | Temp 98.5°F | Ht 63.0 in | Wt 305.0 lb

## 2017-04-13 DIAGNOSIS — D72829 Elevated white blood cell count, unspecified: Secondary | ICD-10-CM

## 2017-04-13 DIAGNOSIS — R112 Nausea with vomiting, unspecified: Secondary | ICD-10-CM

## 2017-04-13 DIAGNOSIS — Z975 Presence of (intrauterine) contraceptive device: Secondary | ICD-10-CM

## 2017-04-13 DIAGNOSIS — I10 Essential (primary) hypertension: Secondary | ICD-10-CM | POA: Diagnosis not present

## 2017-04-13 NOTE — Assessment & Plan Note (Addendum)
Resolved. [facility] However there is a possibility if this was infectious in etiology such as viral gastroenteritis that she did have a leukocytosis. Right upper quadrant ultrasound normal. -Continue to keep well-hydrated -Repeat CBC and BMP today

## 2017-04-13 NOTE — Assessment & Plan Note (Signed)
Controlled and at goal with just amlodipine. Hydrochlorothiazide was discontinued at recent hospitalization -Continue amlodipine 10 mg daily  -continue to check blood pressure at home, discussed goal blood pressure -Return to the clinic 3 months

## 2017-04-13 NOTE — Assessment & Plan Note (Addendum)
Patient reports that she actually has 2 Nexplanon's. 1 in 2013 and one in 2016. Physician was unable to remove the first one when placing the second. Only 1 Nexplanon was able to be palpated on exam today. Discussed this with Dr. Pollie MeyerMcIntyre who also saw the patient with me. -We'll get left humerus x-ray to confirm 2 Nexplanon's -May need to refer to surgery if she does indeed have 2 Nexplanon's so she can have those removed

## 2017-04-13 NOTE — Patient Instructions (Signed)
Thank you for coming in today, it was so nice to see you! Today we talked about:    Hospital follow up: you are doing great! Continue drinking plenty of water  I have ordered an xray for you of your arm. You can go to [place] or [place] imaging to have this done  Please follow up in 3 months.   If we ordered any tests today, you will be notified via telephone of any abnormalities. If everything is normal you will get a letter in the mail.   If you have any questions or concerns, please do not hesitate to call the office at [phone]. You can also message me directly via MyChart.   Sincerely,  Anders Simmondshristina Eliu Batch, MD

## 2017-04-13 NOTE — Assessment & Plan Note (Addendum)
Unclear etiology. Apparently's blood smear was supposed to be for performed in hospital but this was not done. -We will recheck CBC today -May consider performing outpatient peripheral smear if she continues to have leukocytosis

## 2017-04-14 LAB — CBC WITH DIFFERENTIAL/PLATELET
BASOS: 0 %
Basophils Absolute: 0 10*3/uL (ref 0.0–0.2)
EOS (ABSOLUTE): 0.1 10*3/uL (ref 0.0–0.4)
EOS: 1 %
HEMATOCRIT: 43.9 % (ref 34.0–46.6)
HEMOGLOBIN: 14.9 g/dL (ref 11.1–15.9)
Immature Grans (Abs): 0.1 10*3/uL (ref 0.0–0.1)
Immature Granulocytes: 1 %
LYMPHS ABS: 3.6 10*3/uL — AB (ref 0.7–3.1)
Lymphs: 25 %
MCH: 30 pg (ref 26.6–33.0)
MCHC: 33.9 g/dL (ref 31.5–35.7)
MCV: 88 fL (ref 79–97)
MONOCYTES: 6 %
MONOS ABS: 0.8 10*3/uL (ref 0.1–0.9)
NEUTROS ABS: 9.7 10*3/uL — AB (ref 1.4–7.0)
Neutrophils: 67 %
Platelets: 400 10*3/uL — ABNORMAL HIGH (ref 150–379)
RBC: 4.97 x10E6/uL (ref 3.77–5.28)
RDW: 13.7 % (ref 12.3–15.4)
WBC: 14.3 10*3/uL — ABNORMAL HIGH (ref 3.4–10.8)

## 2017-04-14 LAB — COMPREHENSIVE METABOLIC PANEL
A/G RATIO: 1.5 (ref 1.2–2.2)
ALBUMIN: 4.2 g/dL (ref 3.5–5.5)
ALK PHOS: 134 IU/L — AB (ref 39–117)
ALT: 29 IU/L (ref 0–32)
AST: 27 IU/L (ref 0–40)
BUN / CREAT RATIO: 6 — AB (ref 9–23)
BUN: 5 mg/dL — ABNORMAL LOW (ref 6–20)
Bilirubin Total: 0.4 mg/dL (ref 0.0–1.2)
CO2: 23 mmol/L (ref 20–29)
CREATININE: 0.77 mg/dL (ref 0.57–1.00)
Calcium: 9.3 mg/dL (ref 8.7–10.2)
Chloride: 101 mmol/L (ref 96–106)
GFR calc Af Amer: 121 mL/min/{1.73_m2} (ref >59)
GFR calc non Af Amer: 105 mL/min/{1.73_m2} (ref >59)
GLOBULIN, TOTAL: 2.8 g/dL (ref 1.5–4.5)
Glucose: 124 mg/dL — ABNORMAL HIGH (ref 65–99)
POTASSIUM: 3.5 mmol/L (ref 3.5–5.2)
SODIUM: 141 mmol/L (ref 134–144)
Total Protein: 7 g/dL (ref 6.0–8.5)

## 2017-04-26 ENCOUNTER — Encounter: Payer: Self-pay | Admitting: Family Medicine

## 2017-04-26 ENCOUNTER — Other Ambulatory Visit (HOSPITAL_COMMUNITY)
Admission: RE | Admit: 2017-04-26 | Discharge: 2017-04-26 | Disposition: A | Discharge status: Home / Self Care | Payer: Medicaid Other | Source: Ambulatory Visit | Attending: Family Medicine | Admitting: Family Medicine

## 2017-04-26 ENCOUNTER — Ambulatory Visit (HOSPITAL_COMMUNITY)
Admission: RE | Admit: 2017-04-26 | Discharge: 2017-04-26 | Disposition: A | Discharge status: Home / Self Care | Payer: Medicaid Other | Source: Ambulatory Visit | Attending: Family Medicine | Admitting: Family Medicine

## 2017-04-26 ENCOUNTER — Ambulatory Visit (INDEPENDENT_AMBULATORY_CARE_PROVIDER_SITE_OTHER): Payer: Medicaid Other | Admitting: Family Medicine

## 2017-04-26 VITALS — BP 150/95 | HR 91 | Temp 98.3°F | Ht 63.0 in | Wt 302.4 lb

## 2017-04-26 DIAGNOSIS — Z124 Encounter for screening for malignant neoplasm of cervix: Secondary | ICD-10-CM | POA: Diagnosis not present

## 2017-04-26 DIAGNOSIS — N9089 Other specified noninflammatory disorders of vulva and perineum: Secondary | ICD-10-CM

## 2017-04-26 DIAGNOSIS — R9431 Abnormal electrocardiogram [ECG] [EKG]: Secondary | ICD-10-CM | POA: Diagnosis not present

## 2017-04-26 DIAGNOSIS — Z720 Tobacco use: Secondary | ICD-10-CM

## 2017-04-26 DIAGNOSIS — R21 Rash and other nonspecific skin eruption: Secondary | ICD-10-CM

## 2017-04-26 DIAGNOSIS — R002 Palpitations: Secondary | ICD-10-CM | POA: Diagnosis not present

## 2017-04-26 DIAGNOSIS — Z23 Encounter for immunization: Secondary | ICD-10-CM

## 2017-04-26 DIAGNOSIS — F1290 Cannabis use, unspecified, uncomplicated: Secondary | ICD-10-CM | POA: Diagnosis not present

## 2017-04-26 DIAGNOSIS — Z0000 Encounter for general adult medical examination without abnormal findings: Secondary | ICD-10-CM

## 2017-04-26 LAB — POCT WET PREP (WET MOUNT)
CLUE CELLS WET PREP WHIFF POC: NEGATIVE
Trichomonas Wet Prep HPF POC: ABSENT

## 2017-04-26 MED ORDER — TRIAMCINOLONE ACETONIDE 0.1 % EX CREA: 1.0000 "application " | TOPICAL_CREAM | Freq: Two times a day (BID) | CUTANEOUS | 0 refills | Status: DC

## 2017-04-26 NOTE — Patient Instructions (Addendum)
Thank you for coming in today, it was so nice to see you! Today we talked about:    Eczema: I have given you a prescription for steroid cream, please use this twice a day  Vaginal lump: This is likely a benign cyst. Continue to put warm compresses on it. If it becomes more painful please come back and see us   Palpations: we have done an ekg today which was normal. If your heart flutters increase or you have chest pain/shortness of breath please go to the emergency department.   You received your tetanus vaccine today   Please follow up in 1 month to follow up on your blood pressure. You can schedule this appointment at the front desk before you leave or call the clinic.  Bring in all your medications or supplements to each appointment for review.   If we ordered any tests today, you will be notified via telephone of any abnormalities. If everything is normal you will get a letter in the mail.   If you have any questions or concerns, please do not hesitate to call the office at [phone]. You can also message me directly via MyChart.   Sincerely,  Anders Simmondshristina Hobert Poplaski, MD

## 2017-04-26 NOTE — Progress Notes (Signed)
Subjective:  Norma Ramsey is a 31 y.o. year old female who presents to office today for an annual physical examination.  Concerns today include:  1. Rash on back: RASH  Had rash for 14 days. Location: back and arm  Medications tried: None Similar rash in past: No New medications or antibiotics: No Tick, Insect or new pet exposure: No Recent travel: No New detergent or soap: No Immunocompromised: No  Symptoms Itching: Yes Pain over rash: No Feeling ill all over: No Fever: No Mouth sores: No Face or tongue swelling: No Trouble breathing: No Joint swelling or pain: No  .Review of Systems  Constitutional: Negative for fever and weight loss.  HENT: Negative for ear pain, hearing loss and sinus pain.   Eyes: Negative for blurred vision.  Respiratory: Negative for cough, shortness of breath and wheezing.   Cardiovascular: Negative for chest pain and leg swelling. Marland Kitchen. Positive for palpitations. Gastrointestinal: Negative for abdominal pain, blood in stool, constipation, diarrhea, heartburn, melena, nausea and vomiting.  Genitourinary: Negative for dysuria and frequency.  Musculoskeletal: Negative for back pain and joint pain.  Skin: Positive for rash  Neurological: Negative for dizziness, tingling, focal weakness and headaches.  Psychiatric/Behavioral: Negative for depression and suicidal ideas.    General Healthcare: Medication Compliance: complaint with  Dx Hypertension: Yes Dx Hyperlipidemia: No Diabetes: No Dx Obesity: Yes Weight Loss: No Physical Activity: Minimal Urinary Incontinence: None  Menstrual hx: LMP 2 years ago because of Nexplanon  Last dental exam: last month, has to get a tooth pulled in a couple weeks  Social:  reports that she has been smoking Cigarettes.  She started smoking about 5 years ago. She has been smoking about 0.30 packs per day. She has never used smokeless tobacco. Driving: Drives, wear a seatbelt  Alcohol Use: Social, 1 drink a  week Tobacco: continues to smoke- 4 cigarretes a day  Other Drugs: cocaine, marijuana every day   Support and Life at Home: Lives with mother, father, sister, niece son  Advanced Directives: None Work: Works as a Museum/gallery conservatorcustomer service rep at [employer]    Health Maintenance Due  Topic Date Due  . INFLUENZA VACCINE  05/03/2017    Past Medical History Past Medical History:  Diagnosis Date  . Hypertension   . Obesity   . Tobacco abuse    Patient Active Problem List   Diagnosis Date Noted  . Marijuana smoker 05/07/2017  . Rash 05/07/2017  . Labial lesion 05/07/2017  . Palpitations 05/07/2017  . Annual physical exam 05/07/2017  . Nexplanon in place 04/13/2017  . Nausea & vomiting 04/10/2017  . Leukocytosis 04/10/2017  . Hypokalemia 04/10/2017  . Essential hypertension 03/21/2017  . Hematuria 03/08/2017  . Obesity 12/19/2016  . Tobacco abuse 12/19/2016    Medications- reviewed and updated Current Outpatient Prescriptions  Medication Sig Dispense Refill  . amLODipine (NORVASC) 10 MG tablet TAKE 1 TABLET BY MOUTH DAILY 30 tablet 0  . etonogestrel (NEXPLANON) 68 MG IMPL implant 1 each by Subdermal route once.    . ondansetron (ZOFRAN ODT) 4 MG disintegrating tablet Take 1 tablet (4 mg total) by mouth every 8 (eight) hours as needed for nausea or vomiting. 20 tablet 0  . triamcinolone cream (KENALOG) 0.1 % Apply 1 application topically 2 (two) times daily. 30 g 0   No current facility-administered medications for this visit.     Objective: BP (!) 150/95   Pulse 91   Temp 98.3 F (36.8 C) (Oral)   Ht  5\' 3"  (1.6 m)   Wt (!) 302 lb 6.4 oz (137.2 kg)   LMP  (LMP Unknown)   SpO2 98%   BMI 53.57 kg/m  Gen: In no acute distress, alert, cooperative with exam, well groomed, obese HEENT: NCAT, EOMI, PERRL CV: Regular rate and rhythm, normal S1/S2, no murmur Resp: Clear to auscultation bilaterally, no wheezes, non-labored Abd: Soft, Non Tender, Non Distended, bowel sounds  present, no guarding or organomegaly Ext: No edema, warm and well perfused Neuro: Alert and oriented, No gross deficits, normal gait Psych: Normal mood and affect GYN:  External genitalia showing ~1cm flesh colored hard lymp on left labia majoria.  Vaginal mucosa pink, moist, normal rugae.  Nonfriable cervix without lesions, no discharge or bleeding noted on speculum exam.  Bimanual exam revealed normal, nongravid uterus.  No cervical motion tenderness. No adnexal masses bilaterally.    Assessment/Plan:  Marijuana smoker Admits to daily marijuana. Discussed risks of this. No plans of quitting.   Tobacco abuse Continues to smoke. Not interested in quitting.   Rash Eczema vs fungal on back and arms. Will treat with steroid cream for more likely eczema. Discussed proper skin care.. Follow up in 1 week if no improvement.   Labial lesion Appears to be benign cyst on left labia. Will continue to monitor at yearly well woman exam  Palpitations Patient endorsed this on ROS. No chest pain or SOB. Cardiac exam within normal limits. Heart sounds limited due to body habitus. EKG in clinic showing NSR and no concerning findings. Dicussed limiting caffeine intake and if palpations persist will send to cardiology.   Annual physical exam Patient doing well overall. See other A/Ps. Pap smear performed today and TDap given.    Orders Placed This Encounter  Procedures  . Tdap vaccine greater than or equal to 7yo IM  . POCT Wet Prep [employer](Wet [place])  . EKG 12-Lead    Meds ordered this encounter  Medications  . triamcinolone cream (KENALOG) 0.1 %    Sig: Apply 1 application topically 2 (two) times daily.    Dispense:  30 g    Refill:  0     Anders Simmondshristina Gambino, MD [facility], PGY-3

## 2017-04-28 LAB — CYTOLOGY - PAP
CHLAMYDIA, DNA PROBE: NEGATIVE
Diagnosis: NEGATIVE
Neisseria Gonorrhea: NEGATIVE

## 2017-05-03 ENCOUNTER — Encounter: Payer: Self-pay | Admitting: Family Medicine

## 2017-05-07 DIAGNOSIS — R002 Palpitations: Secondary | ICD-10-CM | POA: Insufficient documentation

## 2017-05-07 DIAGNOSIS — N9089 Other specified noninflammatory disorders of vulva and perineum: Secondary | ICD-10-CM | POA: Insufficient documentation

## 2017-05-07 DIAGNOSIS — R21 Rash and other nonspecific skin eruption: Secondary | ICD-10-CM | POA: Insufficient documentation

## 2017-05-07 DIAGNOSIS — Z0000 Encounter for general adult medical examination without abnormal findings: Secondary | ICD-10-CM | POA: Insufficient documentation

## 2017-05-07 DIAGNOSIS — F1290 Cannabis use, unspecified, uncomplicated: Secondary | ICD-10-CM | POA: Insufficient documentation

## 2017-05-07 NOTE — Assessment & Plan Note (Addendum)
Eczema vs fungal on back and arms. Will treat with steroid cream for more likely eczema. Discussed proper skin care.. Follow up in 1 week if no improvement.

## 2017-05-07 NOTE — Assessment & Plan Note (Signed)
Patient endorsed this on ROS. No chest pain or SOB. Cardiac exam within normal limits. Heart sounds limited due to body habitus. EKG in clinic showing NSR and no concerning findings. Dicussed limiting caffeine intake and if palpations persist will send to cardiology.

## 2017-05-07 NOTE — Assessment & Plan Note (Signed)
Appears to be benign cyst on left labia. Will continue to monitor at yearly well woman exam

## 2017-05-07 NOTE — Assessment & Plan Note (Signed)
Continues to smoke. Not interested in quitting.

## 2017-05-07 NOTE — Assessment & Plan Note (Addendum)
Admits to daily marijuana. Discussed risks of this. No plans of quitting.

## 2017-05-07 NOTE — Assessment & Plan Note (Signed)
Patient doing well overall. See other A/Ps. Pap smear performed today and TDap given.

## 2017-05-13 ENCOUNTER — Other Ambulatory Visit: Payer: Self-pay | Admitting: Family Medicine

## 2017-06-29 ENCOUNTER — Other Ambulatory Visit: Payer: Self-pay | Admitting: Family Medicine

## 2017-07-03 ENCOUNTER — Ambulatory Visit: Payer: Self-pay | Admitting: Student

## 2017-08-10 ENCOUNTER — Other Ambulatory Visit: Payer: Self-pay | Admitting: Family Medicine

## 2017-08-30 ENCOUNTER — Ambulatory Visit: Payer: Medicaid Other

## 2017-10-10 ENCOUNTER — Other Ambulatory Visit: Payer: Self-pay | Admitting: Family Medicine

## 2017-10-10 ENCOUNTER — Other Ambulatory Visit: Payer: Self-pay | Admitting: *Deleted

## 2017-10-10 MED ORDER — AMLODIPINE BESYLATE 10 MG PO TABS: 10.0000 mg | ORAL_TABLET | Freq: Every day | ORAL | 0 refills | Status: DC

## 2017-10-10 NOTE — Telephone Encounter (Signed)
Patient left message on nurse line requesting refill on BP med. States only has 2 pills left. Norma Ramsey. Mollye Guinta, RN, BSN

## 2017-10-10 NOTE — Telephone Encounter (Signed)
Already refilled today. Thank you!

## 2017-12-30 ENCOUNTER — Other Ambulatory Visit: Payer: Self-pay | Admitting: Family Medicine

## 2018-03-14 ENCOUNTER — Other Ambulatory Visit: Payer: Self-pay | Admitting: Internal Medicine

## 2018-04-30 ENCOUNTER — Other Ambulatory Visit: Payer: Self-pay

## 2018-04-30 ENCOUNTER — Ambulatory Visit (INDEPENDENT_AMBULATORY_CARE_PROVIDER_SITE_OTHER): Payer: Self-pay | Admitting: Family Medicine

## 2018-04-30 VITALS — BP 185/110 | HR 72 | Temp 98.2°F | Wt 303.0 lb

## 2018-04-30 DIAGNOSIS — L739 Follicular disorder, unspecified: Secondary | ICD-10-CM

## 2018-04-30 DIAGNOSIS — B309 Viral conjunctivitis, unspecified: Secondary | ICD-10-CM | POA: Insufficient documentation

## 2018-04-30 DIAGNOSIS — N764 Abscess of vulva: Secondary | ICD-10-CM | POA: Insufficient documentation

## 2018-04-30 DIAGNOSIS — I10 Essential (primary) hypertension: Secondary | ICD-10-CM

## 2018-04-30 DIAGNOSIS — F17200 Nicotine dependence, unspecified, uncomplicated: Secondary | ICD-10-CM

## 2018-04-30 NOTE — Assessment & Plan Note (Signed)
Acute.  Consistent with viral conjunctivitis.  No signs or history of bacterial conjunctivitis.  No signs of orbital cellulitis systemic symptoms. - Given work note for 3 days with instructions to wash hands regularly

## 2018-04-30 NOTE — Progress Notes (Signed)
Subjective   Patient ID: Norma ShireAlexia B Ramsey    DOB: [birth year], 32 y.o. female   MRN: [id number]  CC: "Eye discharge and vaginal bump"  HPI: Norma Ramsey is a 32 y.o. female who presents to clinic today for the following:  Left eye discharge: Onset 4 days ago with gradual onset with worsening symptoms over the last 24 hours, particularly with clear eye discharge.  Patient denies history of fevers or chills, matting, purulent discharge, pain with ocular movement.  She is not aware of any sick contacts but does endorse some rhinorrhea earlier today.  She denies symptoms regarding her right eye.  Patient is without trauma or history of irritant exposure to left eye.  Patient states she has been flushing it out with water and using Visine without improvement.  She has no change to her vision.  Vaginal bump: Patient reports bump on right labia 2 days ago with some associated itching.  She denies associated pain but does endorse prior episodes of similar bumps in the past.  She has no known HSV or any sexual activity in the last couple months.  She does use a dye free soap given her history of eczema.  Patient denies vaginal bleeding or discharge.  Hypertension: Patient has been missing her amlodipine doses for the past 3 days due to working shift.  She "forgets to take it sometimes."  She is an everyday smoker with 3 cigarettes/day but has "gone down some" on her cigarette use.  She denies coughing, shortness of breath, chest pain.  She is not ready to quit today.  ROS: see HPI for pertinent.  PMFSH: HTN, THC use, obesity, palpitations, smoking.  Surgical history wisdom tooth.  Family history HTN.  Smoking status reviewed. Medications reviewed.  Objective   BP (!) 185/110   Pulse 72   Temp 98.2 F (36.8 C) (Oral)   Wt (!) 303 lb (137.4 kg)   SpO2 99%   BMI 53.67 kg/m  Vitals and nursing note reviewed.  General: well nourished, well developed, NAD with non-toxic appearance HEENT:  normocephalic, atraumatic, moist mucous membranes, conjunctival erythema with copious clear secretions on left eye bearing right without associated purulence or proptosis, PERRLA, EOMI Neck: supple, non-tender without lymphadenopathy Cardiovascular: regular rate and rhythm without murmurs, rubs, or gallops Lungs: clear to auscultation bilaterally with normal work of breathing GU: accompanied by chaperone, slightly hypopigmented papule on right labia majora without purulence or fluctuance, there are multiple papular lesions on distal thighs with associated scarring, no vaginal odor or discharge appreciated Skin: warm, dry, no rashes or lesions, cap refill < 2 seconds Extremities: warm and well perfused, normal tone, no edema  Assessment & Plan   Tobacco abuse Chronic.  Current everyday smoker, 3 cigarettes/day.  Not ready for cessation. - Discussed importance of smoking cessation and advised patient to notify PCP when ready  Essential hypertension Chronic.  Poorly controlled due to nonadherence to antihypertensive therapy.  Current everyday smoker. - Advised patient to set alarm daily for amlodipine 10 mg - Discussed smoking cessation  Viral conjunctivitis of left eye Acute.  Consistent with viral conjunctivitis.  No signs or history of bacterial conjunctivitis.  No signs of orbital cellulitis systemic symptoms. - Given work note for 3 days with instructions to wash hands regularly  Folliculitis Acute on chronic.  Does not appear consistent with HSV.  Does seem more consistent with folliculitis though no underlying abscess.  Hydroadenitis could be on the differential though this case seems to mild. -  Advised to try over-the-counter antibiotics and continue proper hygiene  No orders of the defined types were placed in this encounter.  No orders of the defined types were placed in this encounter.   Durward Parcel, DO [facility], PGY-3 04/30/2018, 12:22 PM

## 2018-04-30 NOTE — Assessment & Plan Note (Signed)
Chronic.  Poorly controlled due to nonadherence to antihypertensive therapy.  Current everyday smoker. - Advised patient to set alarm daily for amlodipine 10 mg - Discussed smoking cessation

## 2018-04-30 NOTE — Assessment & Plan Note (Addendum)
Acute on chronic.  Does not appear consistent with HSV.  Does seem more consistent with folliculitis though no underlying abscess.  Hydroadenitis could be on the differential though this case seems to mild. - Advised to try over-the-counter antibiotics and continue proper hygiene

## 2018-04-30 NOTE — Patient Instructions (Signed)
Thank you for coming in to see us today. Please see below to review our plan for today's visit.  1.  Start taking her amlodipine daily.  It is important that you control your blood pressure so that you do not have complications such as heart attacks and strokes or kidney damage in the future.  It is also important that you stop smoking.  Please let your PCP know when you are ready to stop smoking. 2.  The eye dripping his a viral conjunctivitis.  He will need to be at work for a few days to prevent spreading the infection.  If any point you have crusting and unable to open your eyes due to matting or discharge changes color, let me know and I can send in an antibiotic. 3.  The bump on your vaginal area appears to be consistent with a folliculitis.  This usually will resolve on its own.  Avoid squeezing or picking at this area.  Using a topical over-the-counter antibiotic can help.  Please call the clinic at [phone] if your symptoms worsen or you have any concerns. It was our pleasure to serve you.  Durward Parcelavid McMullen, DO [facility], PGY-3

## 2018-04-30 NOTE — Assessment & Plan Note (Signed)
Chronic.  Current everyday smoker, 3 cigarettes/day.  Not ready for cessation. - Discussed importance of smoking cessation and advised patient to notify PCP when ready

## 2018-05-18 ENCOUNTER — Ambulatory Visit (INDEPENDENT_AMBULATORY_CARE_PROVIDER_SITE_OTHER): Payer: Self-pay | Admitting: Family Medicine

## 2018-05-18 ENCOUNTER — Other Ambulatory Visit: Payer: Self-pay

## 2018-05-18 VITALS — BP 142/80 | HR 76 | Temp 98.6°F | Ht 63.0 in | Wt 304.0 lb

## 2018-05-18 DIAGNOSIS — N764 Abscess of vulva: Secondary | ICD-10-CM

## 2018-05-18 MED ORDER — DOXYCYCLINE HYCLATE 100 MG PO TABS: 100.0000 mg | ORAL_TABLET | Freq: Two times a day (BID) | ORAL | 0 refills | Status: DC

## 2018-05-18 NOTE — Patient Instructions (Signed)
Antibiotic doxycycline twice a day for 5 days.    Folliculitis Folliculitis is inflammation of the hair follicles. Folliculitis most commonly occurs on the scalp, thighs, legs, back, and buttocks. However, it can occur anywhere on the body. What are the causes? This condition may be caused by:  A bacterial infection (common).  A fungal infection.  A viral infection.  Coming into contact with certain chemicals, especially oils and tars.  Shaving or waxing.  Applying greasy ointments or creams to your skin often.  Long-lasting folliculitis and folliculitis that keeps coming back can be caused by bacteria that live in the nostrils. What increases the risk? This condition is more likely to develop in people with:  A weakened immune system.  Diabetes.  Obesity.  What are the signs or symptoms? Symptoms of this condition include:  Redness.  Soreness.  Swelling.  Itching.  Small white or yellow, pus-filled, itchy spots (pustules) that appear over a reddened area. If there is an infection that goes deep into the follicle, these may develop into a boil (furuncle).  A group of closely packed boils (carbuncle). These tend to form in hairy, sweaty areas of the body.  How is this diagnosed? This condition is diagnosed with a skin exam. To find what is causing the condition, your health care provider may take a sample of one of the pustules or boils for testing. How is this treated? This condition may be treated by:  Applying warm compresses to the affected areas.  Taking an antibiotic medicine or applying an antibiotic medicine to the skin.  Applying or bathing with an antiseptic solution.  Taking an over-the-counter medicine to help with itching.  Having a procedure to drain any pustules or boils. This may be done if a pustule or boil contains a lot of pus or fluid.  Laser hair removal. This may be done to treat long-lasting folliculitis.  Follow these instructions  at home:  If directed, apply heat to the affected area as often as told by your health care provider. Use the heat source that your health care provider recommends, such as a moist heat pack or a heating pad. ? Place a towel between your skin and the heat source. ? Leave the heat on for 20-30 minutes. ? Remove the heat if your skin turns bright red. This is especially important if you are unable to feel pain, heat, or cold. You may have a greater risk of getting burned.  If you were prescribed an antibiotic medicine, use it as told by your health care provider. Do not stop using the antibiotic even if you start to feel better.  Take over-the-counter and prescription medicines only as told by your health care provider.  Do not shave irritated skin.  Keep all follow-up visits as told by your health care provider. This is important. Get help right away if:  You have more redness, swelling, or pain in the affected area.  Red streaks are spreading from the affected area.  You have a fever. This information is not intended to replace advice given to you by your health care provider. Make sure you discuss any questions you have with your health care provider. Document Released: 11/28/2001 Document Revised: 04/08/2016 Document Reviewed: 07/10/2015 Elsevier Interactive Patient Education  2018 [employer]

## 2018-05-18 NOTE — Progress Notes (Signed)
    Subjective:  Norma Ramsey is a 32 y.o. female who presents to the [facility] today with a chief complaint of boil on vagina.  HPI:  Was seen for this on 04/30/2018 at which time she was told it was a clogged hair follicle.  However since that time it is gotten much bigger and more painful. It is on her external labia right side. It is started to bleed on its own. No fevers or chills.  No new rashes. No STD exposures No vaginal bleeding or discharge   ROS: Per HPI  PMH: She reports that she has been smoking cigarettes. She started smoking about 6 years ago. She has been smoking about 0.30 packs per day. She has never used smokeless tobacco. She reports that she drinks about 1.0 standard drinks of alcohol per week. She reports that she has current or past drug history. Drug: Marijuana.   Objective:  Physical Exam: BP (!) 142/80   Pulse 76   Temp 98.6 F (37 C) (Oral)   Ht 5\' 3"  (1.6 m)   Wt (!) 304 lb (137.9 kg)   SpO2 99%   BMI 53.85 kg/m   Gen: NAD, resting comfortably Pulm: NWOB on room air MSK: no edema, cyanosis, or clubbing noted Skin: 0.5cm diameter abscess TTP on R external labia with serosanguinous  Drainage, no purulence. No fluctuance. Minimal surrounding erythema. Neuro: grossly normal, moves all extremities Psych: Normal affect and thought content   Assessment/Plan:  Abscess of labia R labial abscess that likely started off as folliculitis. Has started self draining and has no systemic symptoms so treat with doxycycline for 5 days. Discussed return precautions with patient including worsening pain, redness or warmth; fever; purulent drainage.   Leland HerElsia J Zackariah Vanderpol, DO PGY-3, [facility] 05/18/2018 2:15 PM

## 2018-05-19 NOTE — Assessment & Plan Note (Signed)
R labial abscess that likely started off as folliculitis. Has started self draining and has no systemic symptoms so treat with doxycycline for 5 days. Discussed return precautions with patient including worsening pain, redness or warmth; fever; purulent drainage.

## 2018-06-04 ENCOUNTER — Other Ambulatory Visit: Payer: Self-pay | Admitting: Family Medicine

## 2018-06-16 IMAGING — US US ABDOMEN LIMITED
1 series · 14 of 25 positions shown · non-contrast
Comparison: None.

CLINICAL DATA: Nausea and vomiting

EXAM:
ULTRASOUND ABDOMEN LIMITED RIGHT UPPER QUADRANT

[Series 1: us abdomen limited · 0.26mm/px · 14 of 33 slices shown]
[im 1/33]
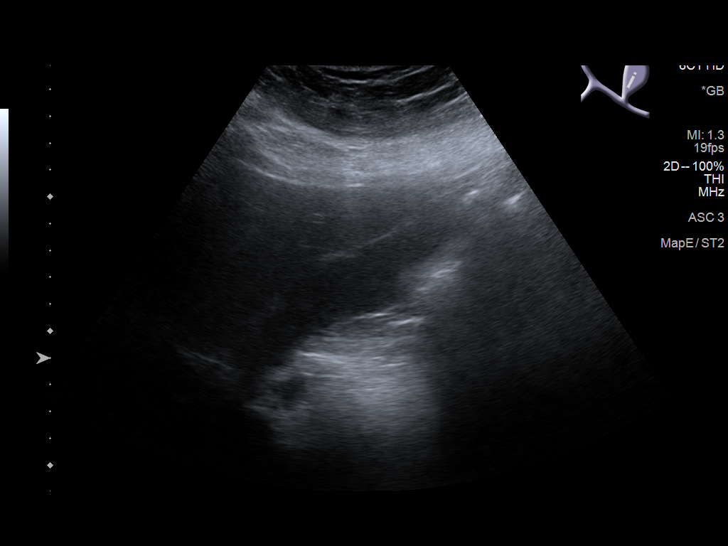
[im 3/33]
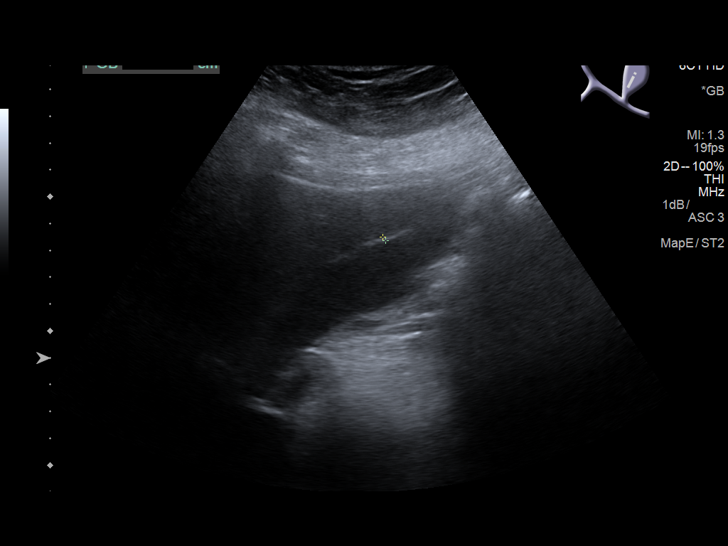
[im 6/33]
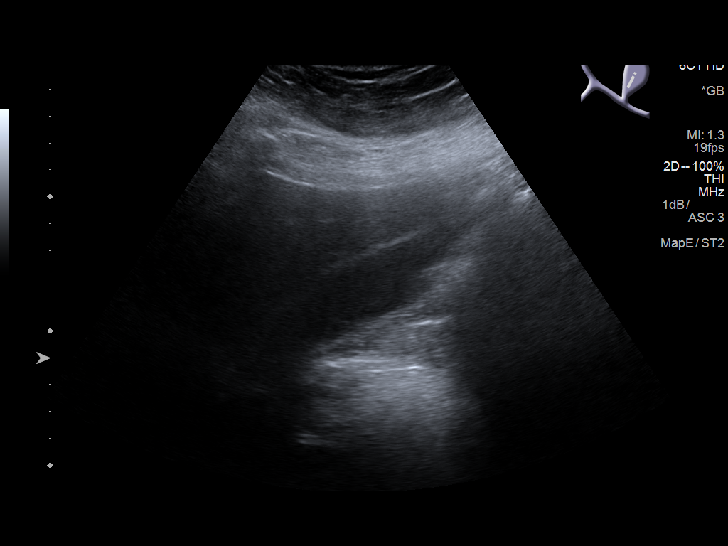
[im 9/33]
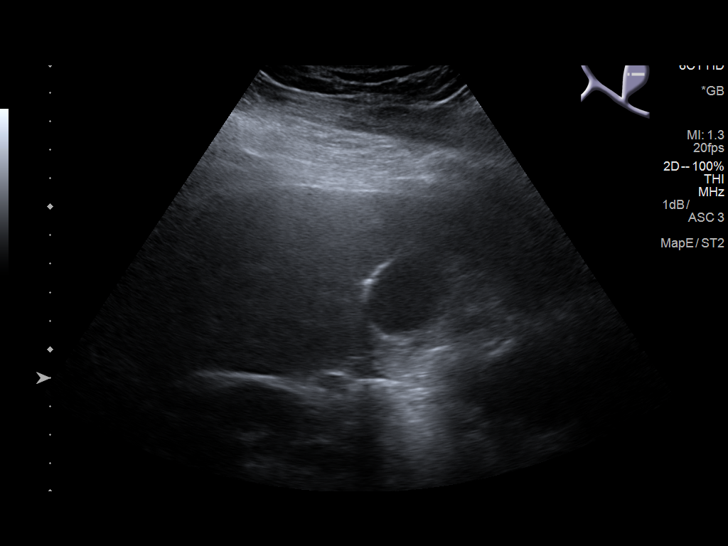
[im 11/33]
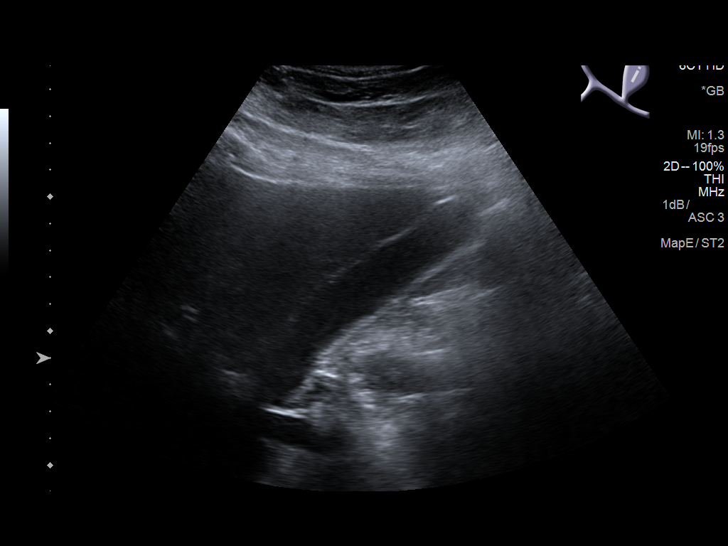
[im 13/33]
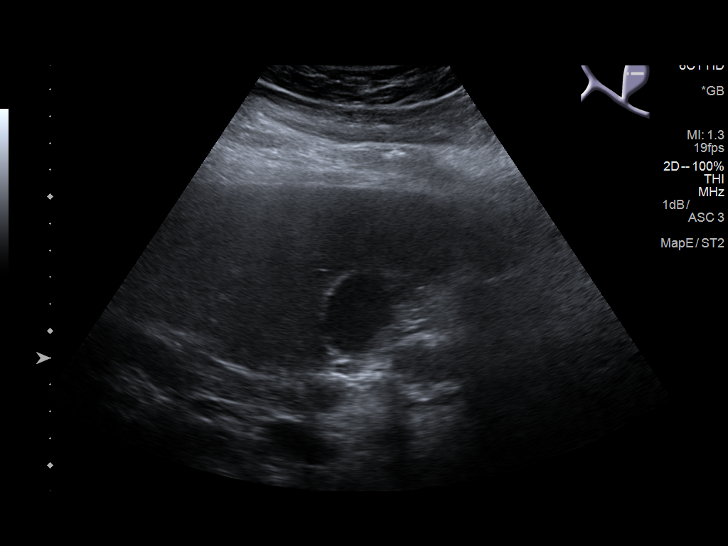
[im 15/33]
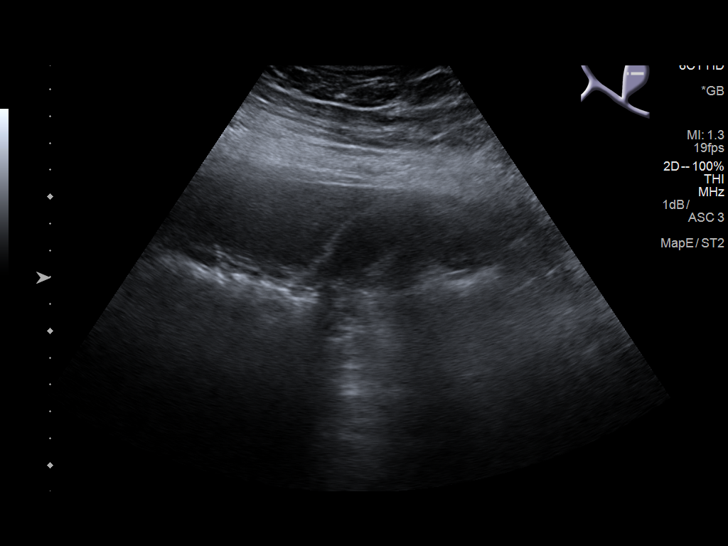
[im 18/33]
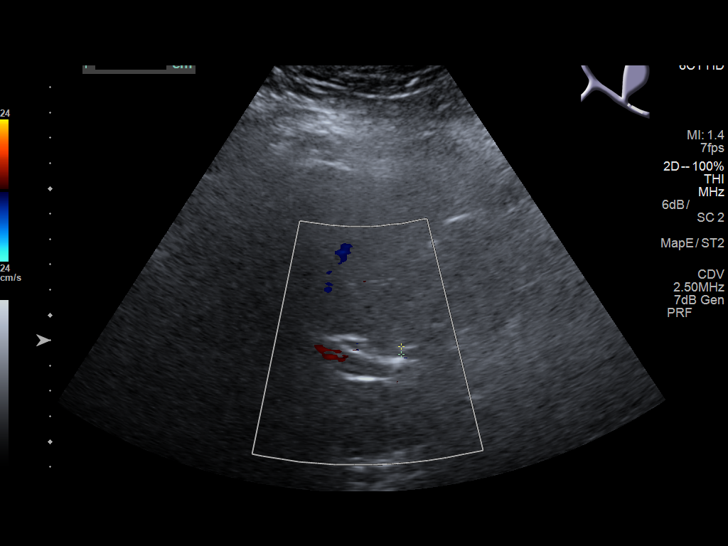
[im 21/33]
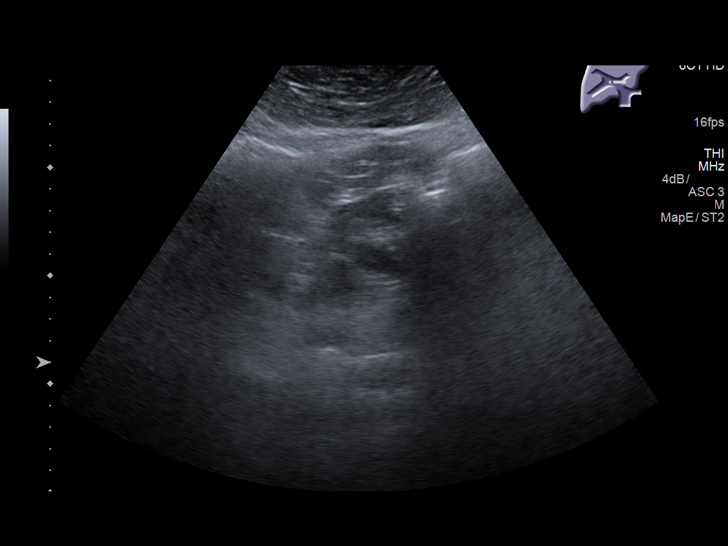
[im 22/33]
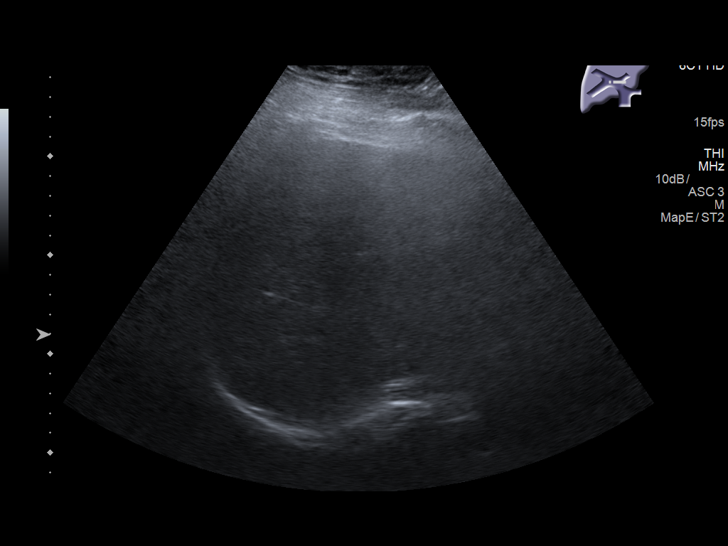
[im 25/33]
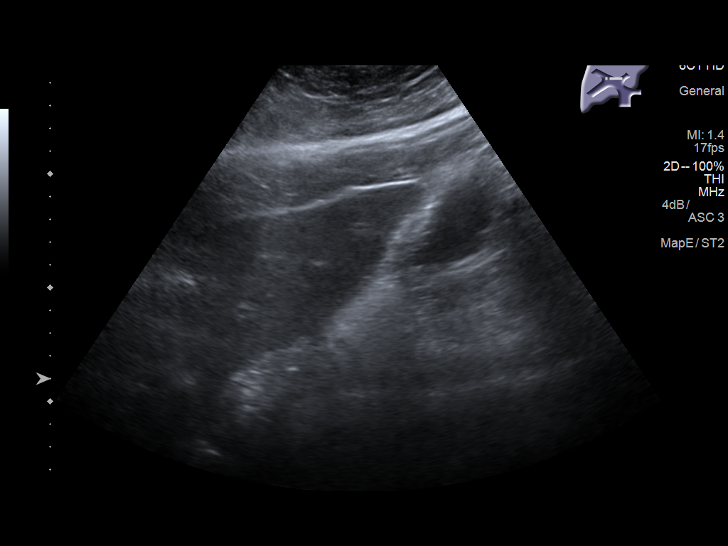
[im 27/33]
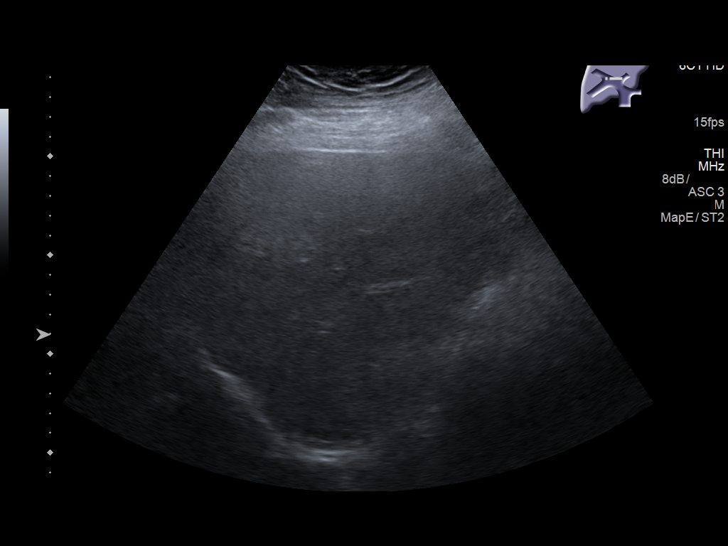
[im 30/33]
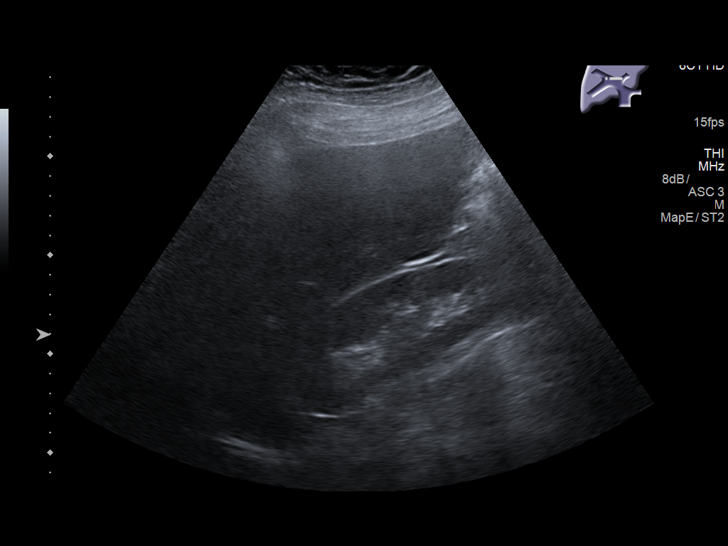
[im 33/33]
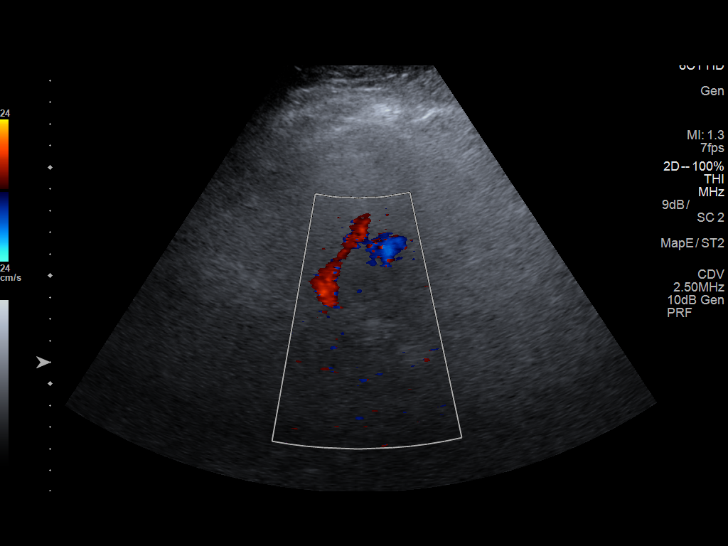

[14 of 25 positions shown; findings below may reference images not displayed]

FINDINGS: Gallbladder:

No gallstones or wall thickening visualized. No sonographic Murphy
sign noted by sonographer.

Common bile duct:

Diameter: 3 mm

Liver:

No focal lesion identified. Within normal limits in parenchymal
echogenicity.
IMPRESSION: Negative right upper quadrant abdominal ultrasound

## 2018-07-31 ENCOUNTER — Ambulatory Visit (INDEPENDENT_AMBULATORY_CARE_PROVIDER_SITE_OTHER): Payer: Managed Care, Other (non HMO) | Admitting: Obstetrics & Gynecology

## 2018-07-31 ENCOUNTER — Other Ambulatory Visit: Payer: Self-pay

## 2018-07-31 ENCOUNTER — Encounter: Payer: Self-pay | Admitting: General Practice

## 2018-07-31 ENCOUNTER — Encounter

## 2018-07-31 VITALS — BP 168/118 | HR 88 | Temp 97.6°F | Ht 63.0 in | Wt 203.8 lb

## 2018-07-31 DIAGNOSIS — Z30017 Encounter for initial prescription of implantable subdermal contraceptive: Secondary | ICD-10-CM

## 2018-07-31 DIAGNOSIS — Z3046 Encounter for surveillance of implantable subdermal contraceptive: Secondary | ICD-10-CM

## 2018-07-31 MED ORDER — ETONOGESTREL 68 MG ~~LOC~~ IMPL
68.0000 mg | DRUG_IMPLANT | Freq: Once | SUBCUTANEOUS | Status: AC
  Administered 2018-07-31: 68 mg via SUBCUTANEOUS

## 2018-07-31 NOTE — Addendum Note (Signed)
Addended by: Clovis Pu on: 07/31/2018 03:05 PM   Modules accepted: Orders

## 2018-07-31 NOTE — Progress Notes (Signed)
     GYNECOLOGY OFFICE PROCEDURE NOTE  Norma Ramsey is a 32 y.o. F here for Nexplanon removal and Nexplanon re-insertion.  Current Nexplanon was due for removal in 01/2018; this is her third Nexplanon.  Last pap smear was on 04/26/2017 and was normal and she is up to date. She had an annual exam at [facility] on 04/30/2018 with her PCP.  No other gynecologic concerns.   Nexplanon Removal and Insertion  Patient identified, informed consent performed, consent signed.   Patient does understand that irregular bleeding is a very common side effect of this medication. She was advised to have backup contraception for one week after replacement of the implant. Pregnancy test in clinic today was negative.  Appropriate time out taken. Implanon site identified. Area prepped in usual sterile fashon. It was very difficult to palpate the implant as it was deeper than expected on the left arm, but it was finally palpated. One ml of 1% lidocaine was used to anesthetize the area at the distal end of the implant. A small stab incision was made right beside the implant on the distal portion. The Nexplanon rod was grasped using hemostats and removed with some difficulty given tough fibrous tissue around the implant. There was minimal blood loss. There were no complications. Area was then injected with 3 ml of 1 % lidocaine. She was re-prepped with betadine, Nexplanon removed from packaging, Device confirmed in needle, then inserted full length of needle and withdrawn per handbook instructions. Nexplanon was able to palpated in the patient's left arm; patient palpated the insert herself.  There was minimal blood loss. Patient insertion site covered with guaze and a pressure bandage to reduce any bruising. The patient tolerated the procedure well and was given post procedure instructions.  She was advised to have backup contraception for one week.     Jaynie Collins, MD, FACOG Obstetrician & Gynecologist,  [facility] for [employer], [facility]

## 2018-07-31 NOTE — Patient Instructions (Signed)
Nexplanon Instructions After Insertion  Keep bandage clean and dry for 24 hours  May use ice/Tylenol/Ibuprofen for soreness or pain  If you develop fever, drainage or increased warmth from incision site-contact office immediately   

## 2018-08-13 ENCOUNTER — Telehealth: Payer: Self-pay | Admitting: *Deleted

## 2018-08-13 NOTE — Telephone Encounter (Signed)
Patient called stating she has a pea size knot in area where nexplanon was removed and reinserted. Advised patient that scar tissue can build up in that area. She can apply vitamin E oil to that area. Pt denies any pain, rash, bleeding or itchiness in the area. Advised patient if any symptoms occur, patient to call clinic for an appointment.  Clovis Pu, RN

## 2018-08-16 ENCOUNTER — Ambulatory Visit: Payer: Medicaid Other | Admitting: Obstetrics and Gynecology

## 2019-02-06 ENCOUNTER — Other Ambulatory Visit: Payer: Self-pay

## 2019-02-06 MED ORDER — AMLODIPINE BESYLATE 10 MG PO TABS: 10.0000 mg | ORAL_TABLET | Freq: Every day | ORAL | 0 refills | Status: DC

## 2019-02-06 NOTE — Telephone Encounter (Signed)
LVM for patient to call and schedule OV, as she has not been seen this year.

## 2019-05-17 ENCOUNTER — Encounter: Payer: Managed Care, Other (non HMO) | Admitting: Family Medicine

## 2019-05-21 ENCOUNTER — Ambulatory Visit: Payer: Managed Care, Other (non HMO) | Admitting: Family Medicine

## 2019-05-27 ENCOUNTER — Encounter: Payer: Managed Care, Other (non HMO) | Admitting: Family Medicine

## 2019-07-12 ENCOUNTER — Other Ambulatory Visit: Payer: Self-pay | Admitting: *Deleted

## 2019-07-12 MED ORDER — AMLODIPINE BESYLATE 10 MG PO TABS: 10.0000 mg | ORAL_TABLET | Freq: Every day | ORAL | 0 refills | Status: DC

## 2019-07-12 NOTE — Telephone Encounter (Signed)
Please help patient schedule appointments for labs and BP check.  Thanks!  Dorris Singh, MD  Family Medicine Teaching Service

## 2019-11-19 ENCOUNTER — Ambulatory Visit: Payer: Medicaid Other | Attending: Internal Medicine

## 2019-11-20 ENCOUNTER — Ambulatory Visit: Payer: Medicaid Other | Attending: Internal Medicine

## 2019-11-20 ENCOUNTER — Ambulatory Visit: Payer: Managed Care, Other (non HMO)

## 2019-11-20 DIAGNOSIS — Z20822 Contact with and (suspected) exposure to covid-19: Secondary | ICD-10-CM

## 2019-11-21 LAB — NOVEL CORONAVIRUS, NAA: SARS-CoV-2, NAA: NOT DETECTED

## 2019-12-02 ENCOUNTER — Encounter: Payer: Self-pay | Admitting: Family Medicine

## 2019-12-02 ENCOUNTER — Ambulatory Visit: Payer: Medicaid Other | Attending: Internal Medicine

## 2019-12-02 ENCOUNTER — Other Ambulatory Visit: Payer: Self-pay

## 2019-12-02 ENCOUNTER — Other Ambulatory Visit: Payer: Self-pay | Admitting: Family Medicine

## 2019-12-02 ENCOUNTER — Ambulatory Visit (INDEPENDENT_AMBULATORY_CARE_PROVIDER_SITE_OTHER): Payer: Medicaid Other | Admitting: Family Medicine

## 2019-12-02 VITALS — BP 192/122 | HR 108 | Wt 324.2 lb

## 2019-12-02 DIAGNOSIS — Z114 Encounter for screening for human immunodeficiency virus [HIV]: Secondary | ICD-10-CM

## 2019-12-02 DIAGNOSIS — R7303 Prediabetes: Secondary | ICD-10-CM | POA: Insufficient documentation

## 2019-12-02 DIAGNOSIS — I10 Essential (primary) hypertension: Secondary | ICD-10-CM

## 2019-12-02 DIAGNOSIS — R739 Hyperglycemia, unspecified: Secondary | ICD-10-CM

## 2019-12-02 DIAGNOSIS — Z1159 Encounter for screening for other viral diseases: Secondary | ICD-10-CM

## 2019-12-02 DIAGNOSIS — Z20822 Contact with and (suspected) exposure to covid-19: Secondary | ICD-10-CM

## 2019-12-02 LAB — POCT GLYCOSYLATED HEMOGLOBIN (HGB A1C): HbA1c, POC (controlled diabetic range): 5.8 % (ref 0.0–7.0)

## 2019-12-02 MED ORDER — AMLODIPINE BESYLATE 10 MG PO TABS: 10.0000 mg | ORAL_TABLET | Freq: Every day | ORAL | 3 refills | Status: DC

## 2019-12-02 NOTE — Progress Notes (Addendum)
Subjective  Norma Ramsey is a 34 y.o. female is presenting with the following: annual examination.   At the start of the visit, the patient reported her son has COVID19. He was diagnosed ~14 days ago. She has a pending COVID test. The remainder of the visit was conducted virtually.  HTN The patient has a history of poorly controlled HTN. BP elevated. No chest pain, vision changes, nausea, vomiting, headache. Reports she did not take her Norvasc 10 mg this AM. Denies side effects but has difficulty with compliance. Misses medication multiple times per week.   Weight loss/gain Patient would like to be under 200 pounds. She has tried multiple fad diets without success. No regular exercise. Has tried Keto diet, cleanses, fasting---nothing has worked.   Prediabetes History of elevated BG on 2018 labs. Denies polyuria or polydipsia. Would like to lose weight as above.   PMH: Nexplanon in place Tobacco use  HTN Obesity    Objective Vital Signs reviewed BP (!) 192/122   Pulse (!) 108   Wt (!) 324 lb 3.2 oz (147.1 kg)   SpO2 99%   BMI 57.43 kg/m  Cardiac: Warm well perfused.  Capillary refill less than 3 seconds Respiratory breathing comfortably on room air Psych: Pleasant normal affect, appropriate, normal rate of speech  Assessments/Plans  Essential hypertension Not well controlled. Refilled medications, labs ordered for future. Discussed strategies for compliance, follow up for BP check in 3-4 weeks. Consider evaluation for secondary causes in future-- has strong FHx. Hypokalemia--query if primary hyperaldosteronism.   Prediabetes Referral to Bariatric Surgery in [facility] per request. Discussed dietary, exercise strategies. Information given to local weight loss programs and clinics    Morbid obesity (HCC) As below for prediabetes plan. Consider GLP1 agonist in future. Could also use metformin given prediabetes.   At follow up- tobacco cessation.  Follow up in 3-4 weeks for BP.  Lab visit scheduled.  See after visit summary for details of patient instructions  Total time in person and on phone 15 minutes   Terisa Starr, MD  [facility] Teaching Service

## 2019-12-02 NOTE — Assessment & Plan Note (Signed)
Not well controlled. Refilled medications, labs ordered for future. Discussed strategies for compliance, follow up for BP check in 3-4 weeks.

## 2019-12-02 NOTE — Addendum Note (Signed)
Addended by: Manson Passey, Cordel Drewes on: 12/02/2019 02:25 PM   Modules accepted: Level of Service

## 2019-12-02 NOTE — Assessment & Plan Note (Signed)
As below for prediabetes plan. Consider GLP1 agonist in future. Could also use metformin given prediabetes.

## 2019-12-02 NOTE — Assessment & Plan Note (Signed)
Referral to Bariatric Surgery in [facility] per request. Discussed dietary, exercise strategies. Information given to local weight loss programs and clinics

## 2019-12-02 NOTE — Patient Instructions (Addendum)
It was wonderful to see you today.  Thank you for choosing [facility].   Please call [phone] with any questions about today's appointment.  Please be sure to schedule follow up at the front  desk before you leave today.   Terisa Starr, MD  Family Medicine   Medical Weight loss therapy:   [facility]  [address] [place], [place], [place] [address] Phone: [phone]  I sent a referral to [facility] for the surgery  Please return to for labs on March 19th       Mediterranean Diet  Why follow it? Research shows. . Those who follow the Mediterranean diet have a reduced risk of heart disease  . The diet is associated with a reduced incidence of Parkinson's and Alzheimer's diseases . People following the diet may have longer life expectancies and lower rates of chronic diseases  . The Dietary Guidelines for Americans recommends the Mediterranean diet as an eating plan to promote health and prevent disease  What Is the Mediterranean Diet?  . Healthy eating plan based on typical foods and recipes of Mediterranean-style cooking . The diet is primarily a plant based diet; these foods should make up a majority of meals   Starches - Plant based foods should make up a majority of meals - They are an important sources of vitamins, minerals, energy, antioxidants, and fiber - Choose whole grains, foods high in fiber and minimally processed items  - Typical grain sources include wheat, oats, barley, corn, Jabri Blancett rice, bulgar, farro, millet, polenta, couscous  - Various types of beans include chickpeas, lentils, fava beans, black beans, white beans   Fruits  Veggies - Large quantities of antioxidant rich fruits & veggies; 6 or more servings  - Vegetables can be eaten raw or lightly drizzled with oil and cooked  - Vegetables common to the traditional Mediterranean Diet include: artichokes, arugula, beets, broccoli, brussel sprouts, cabbage, carrots, celery, collard  greens, cucumbers, eggplant, kale, leeks, lemons, lettuce, mushrooms, okra, onions, peas, peppers, potatoes, pumpkin, radishes, rutabaga, shallots, spinach, sweet potatoes, turnips, zucchini - Fruits common to the Mediterranean Diet include: apples, apricots, avocados, cherries, clementines, dates, figs, grapefruits, grapes, melons, nectarines, oranges, peaches, pears, pomegranates, strawberries, tangerines  Fats - Replace butter and margarine with healthy oils, such as olive oil, canola oil, and tahini  - Limit nuts to no more than a handful a day  - Nuts include walnuts, almonds, pecans, pistachios, pine nuts  - Limit or avoid candied, honey roasted or heavily salted nuts - Olives are central to the [employer] - can be eaten whole or used in a variety of dishes   Meats Protein - Limiting red meat: no more than a few times a month - When eating red meat: choose lean cuts and keep the portion to the size of deck of cards - Eggs: approx. 0 to 4 times a week  - Fish and lean poultry: at least 2 a week  - Healthy protein sources include, chicken, [place], lean beef, lamb - Increase intake of seafood such as tuna, salmon, trout, mackerel, shrimp, scallops - Avoid or limit high fat processed meats such as sausage and bacon  Dairy - Include moderate amounts of low fat dairy products  - Focus on healthy dairy such as fat free yogurt, skim milk, low or reduced fat cheese - Limit dairy products higher in fat such as whole or 2% milk, cheese, ice cream  Alcohol - Moderate amounts of red wine  is ok  - No more than 5 oz daily for women (all ages) and men older than age 71  - No more than 10 oz of wine daily for men younger than 12  Other - Limit sweets and other desserts  - Use herbs and spices instead of salt to flavor foods  - Herbs and spices common to the traditional Mediterranean Diet include: basil, bay leaves, chives, cloves, cumin, fennel, garlic, lavender, marjoram, mint, oregano, parsley,  pepper, rosemary, sage, savory, sumac, tarragon, thyme   It's not just a diet, it's a lifestyle:  . The Mediterranean diet includes lifestyle factors typical of those in the region  . Foods, drinks and meals are best eaten with others and savored . Daily physical activity is important for overall good health . This could be strenuous exercise like running and aerobics . This could also be more leisurely activities such as walking, housework, yard-work, or taking the stairs . Moderation is the key; a balanced and healthy diet accommodates most foods and drinks . Consider portion sizes and frequency of consumption of certain foods   Meal Ideas & Options:  . Breakfast:  o Whole wheat toast or whole wheat English muffins with peanut butter & hard boiled egg o Steel cut oats topped with apples & cinnamon and skim milk  o Fresh fruit: banana, strawberries, melon, berries, peaches  o Smoothies: strawberries, bananas, greek yogurt, peanut butter o Low fat greek yogurt with blueberries and granola  o Egg white omelet with spinach and mushrooms o Breakfast couscous: whole wheat couscous, apricots, skim milk, cranberries  . Sandwiches:  o Hummus and grilled vegetables (peppers, zucchini, squash) on whole wheat bread   o Grilled chicken on whole wheat pita with lettuce, tomatoes, cucumbers or tzatziki  o Tuna salad on whole wheat bread: tuna salad made with greek yogurt, olives, red peppers, capers, green onions o Garlic rosemary lamb pita: lamb sauted with garlic, rosemary, salt & pepper; add lettuce, cucumber, greek yogurt to pita - flavor with lemon juice and black pepper  . Seafood:  o Mediterranean grilled salmon, seasoned with garlic, basil, parsley, lemon juice and black pepper o Shrimp, lemon, and spinach whole-grain pasta salad made with low fat greek yogurt  o Seared scallops with lemon orzo  o Seared tuna steaks seasoned salt, pepper, coriander topped with tomato mixture of olives,  tomatoes, olive oil, minced garlic, parsley, green onions and cappers  . Meats:  o Herbed greek chicken salad with kalamata olives, cucumber, feta  o Red bell peppers stuffed with spinach, bulgur, lean ground beef (or lentils) & topped with feta   o Kebabs: skewers of chicken, tomatoes, onions, zucchini, squash  o [place] burgers: made with red onions, mint, dill, lemon juice, feta cheese topped with roasted red peppers . Vegetarian o Cucumber salad: cucumbers, artichoke hearts, celery, red onion, feta cheese, tossed in olive oil & lemon juice  o Hummus and whole grain pita points with a greek salad (lettuce, tomato, feta, olives, cucumbers, red onion) o Lentil soup with celery, carrots made with vegetable broth, garlic, salt and pepper  o Tabouli salad: parsley, bulgur, mint, scallions, cucumbers, tomato, radishes, lemon juice, olive oil, salt and pepper.

## 2019-12-03 ENCOUNTER — Telehealth: Payer: Self-pay | Admitting: Family Medicine

## 2019-12-03 LAB — NOVEL CORONAVIRUS, NAA: SARS-CoV-2, NAA: NOT DETECTED

## 2019-12-03 NOTE — Telephone Encounter (Signed)
Left generic voicemail.  If patient calls back, please let her know repeat COVID negative.   HbA1C in prediabetic range, similar to before, repeat in 3-6 months. Will send via MyChart.   Terisa Starr, MD  Family Medicine Teaching Service

## 2019-12-04 NOTE — Telephone Encounter (Signed)
Patient calls nurse line returning PCPs call. Results given.

## 2019-12-18 ENCOUNTER — Ambulatory Visit: Payer: Managed Care, Other (non HMO) | Admitting: Internal Medicine

## 2019-12-20 ENCOUNTER — Other Ambulatory Visit: Payer: Self-pay

## 2019-12-20 ENCOUNTER — Other Ambulatory Visit: Payer: Medicaid Other

## 2019-12-20 DIAGNOSIS — I10 Essential (primary) hypertension: Secondary | ICD-10-CM

## 2019-12-20 DIAGNOSIS — Z1159 Encounter for screening for other viral diseases: Secondary | ICD-10-CM

## 2019-12-20 DIAGNOSIS — Z114 Encounter for screening for human immunodeficiency virus [HIV]: Secondary | ICD-10-CM

## 2019-12-21 LAB — BASIC METABOLIC PANEL
BUN/Creatinine Ratio: 14 (ref 9–23)
BUN: 8 mg/dL (ref 6–20)
CO2: 24 mmol/L (ref 20–29)
Calcium: 8.2 mg/dL — ABNORMAL LOW (ref 8.7–10.2)
Chloride: 99 mmol/L (ref 96–106)
Creatinine, Ser: 0.56 mg/dL — ABNORMAL LOW (ref 0.57–1.00)
GFR calc Af Amer: 143 mL/min/{1.73_m2} (ref >59)
GFR calc non Af Amer: 124 mL/min/{1.73_m2} (ref >59)
Glucose: 107 mg/dL — ABNORMAL HIGH (ref 65–99)
Potassium: 3.6 mmol/L (ref 3.5–5.2)
Sodium: 141 mmol/L (ref 134–144)

## 2019-12-21 LAB — LIPID PANEL
Chol/HDL Ratio: 4.5 ratio — ABNORMAL HIGH (ref 0.0–4.4)
Cholesterol, Total: 263 mg/dL — ABNORMAL HIGH (ref 100–199)
HDL: 58 mg/dL (ref >39)
LDL Chol Calc (NIH): 145 mg/dL — ABNORMAL HIGH (ref 0–99)
Triglycerides: 331 mg/dL — ABNORMAL HIGH (ref 0–149)
VLDL Cholesterol Cal: 60 mg/dL — ABNORMAL HIGH (ref 5–40)

## 2019-12-21 LAB — HIV ANTIBODY (ROUTINE TESTING W REFLEX): HIV Screen 4th Generation wRfx: NONREACTIVE

## 2019-12-21 LAB — HEPATITIS C ANTIBODY (REFLEX): HCV Ab: <0.1 s/co ratio (ref 0.0–0.9)

## 2019-12-21 LAB — HCV COMMENT:

## 2020-01-24 ENCOUNTER — Ambulatory Visit: Payer: Managed Care, Other (non HMO) | Admitting: Internal Medicine

## 2020-03-06 ENCOUNTER — Ambulatory Visit: Payer: Managed Care, Other (non HMO) | Admitting: Internal Medicine

## 2020-03-06 NOTE — Progress Notes (Deleted)
HPI  Patient presents to the clinic today to establish care for management of the conditions listed below.  HTN: Her BP today is.  She is taking amlodipine as prescribed.  ECG from 04/2017 reviewed.  Prediabetes: Prolactin was 6.2%, 12/2019.  She is not taking any diabetic mission at this time.  She does not check her sugars.  Flu: Tetanus: 04/2017 Covid: Pap smear: 04/2017 Dentist:  Past Medical History:  Diagnosis Date  . Hypertension   . Obesity   . Prediabetes   . Tobacco abuse     Current Outpatient Medications  Medication Sig Dispense Refill  . amLODipine (NORVASC) 10 MG tablet Take 1 tablet (10 mg total) by mouth daily. Patient needs office visit before next refill. 90 tablet 3  . etonogestrel (NEXPLANON) 68 MG IMPL implant 1 each by Subdermal route once.     No current facility-administered medications for this visit.    No Known Allergies  Family History  Problem Relation Age of Onset  . Hypertension Mother   . Hypertension Father   . Diabetes Paternal Grandfather   . AAA (abdominal aortic aneurysm) Paternal Grandfather     Social History   Socioeconomic History  . Marital status: Single    Spouse name: Not on file  . Number of children: Not on file  . Years of education: some Hotel manager college  . Highest education level: Not on file  Occupational History  . Occupation: customer service rep   Tobacco Use  . Smoking status: Current Every Day Smoker    Packs/day: 0.30    Types: Cigarettes    Start date: 11/26/2011  . Smokeless tobacco: Never Used  Substance and Sexual Activity  . Alcohol use: Yes    Alcohol/week: 1.0 standard drinks    Types: 1 Glasses of wine per week    Comment: every day  . Drug use: Yes    Types: Marijuana    Comment: every day  . Sexual activity: Never    Partners: Male    Birth control/protection: Implant  Other Topics Concern  . 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    Social Determinants of Health   Financial Resource Strain:   . Difficulty of Paying Living Expenses:   Food Insecurity:   . Worried About Charity fundraiser in the Last Year:   . Arboriculturist in the Last Year:   Transportation Needs:   . Film/video editor (Medical):   Marland Kitchen Lack of Transportation (Non-Medical):   Physical Activity:   . Days of Exercise per Week:   . Minutes of Exercise per Session:   Stress:   . Feeling of Stress :   Social Connections:   . Frequency of Communication with Friends and Family:   . Frequency of Social Gatherings with Friends and Family:   . Attends Religious Services:   . Active Member of Clubs or Organizations:   . Attends Archivist Meetings:   Marland Kitchen Marital Status:   Intimate Partner Violence:   . Fear of Current or Ex-Partner:   . Emotionally Abused:   Marland Kitchen Physically Abused:   . Sexually Abused:     ROS:  Constitutional: Denies fever, malaise, fatigue, headache or abrupt weight changes.  HEENT: Denies eye pain, eye redness, ear pain, ringing in the ears, wax buildup, runny nose, nasal congestion, bloody nose, or sore throat. Respiratory: Denies difficulty breathing, shortness of breath,  cough or sputum production.   Cardiovascular: Denies chest pain, chest tightness, palpitations or swelling in the hands or feet.  Gastrointestinal: Denies abdominal pain, bloating, constipation, diarrhea or blood in the stool.  GU: Denies frequency, urgency, pain with urination, blood in urine, odor or discharge. Musculoskeletal: Denies decrease in range of motion, difficulty with gait, muscle pain or joint pain and swelling.  Skin: Denies redness, rashes, lesions or ulcercations.  Neurological: Denies dizziness, difficulty with memory, difficulty with speech or problems with balance and coordination.  Psych: Denies anxiety, depression, SI/HI.  No other specific complaints in a complete review of systems (except as listed in HPI  above).  PE:  There were no vitals taken for this visit. Wt Readings from Last 3 Encounters:  12/02/19 (!) 324 lb 3.2 oz (147.1 kg)  04/11/17 (!) 302 lb 6.4 oz (137.2 kg)  12/19/16 (!) 322 lb 6.4 oz (146.2 kg)    General: Appears their stated age, well developed, well nourished in NAD. HEENT: Head: normal shape and size; Eyes: sclera white, no icterus, conjunctiva pink, PERRLA and EOMs intact; Ears: Tm's gray and intact, normal light reflex;Throat/Mouth: Teeth present, mucosa pink and moist, no lesions or ulcerations noted.  Neck: Neck supple, trachea midline. No masses, lumps or thyromegaly present.  Cardiovascular: Normal rate and rhythm. S1,S2 noted.  No murmur, rubs or gallops noted. No JVD or BLE edema. No carotid bruits noted. Pulmonary/Chest: Normal effort and positive vesicular breath sounds. No respiratory distress. No wheezes, rales or ronchi noted.  Abdomen: Soft and nontender. Normal bowel sounds, no bruits noted. No distention or masses noted. Liver, spleen and kidneys non palpable. Musculoskeletal: Normal range of motion. Strength 5/5 BUE/BLE. No signs of joint swelling. No difficulty with gait.  Neurological: Alert and oriented. Cranial nerves II-XII grossly intact. Coordination normal.  Psychiatric: Mood and affect normal. Behavior is normal. Judgment and thought content normal.   EKG:  BMET    Component Value Date/Time   NA 141 12/20/2019 1634   K 3.6 12/20/2019 1634   CL 99 12/20/2019 1634   CO2 24 12/20/2019 1634   GLUCOSE 107 (H) 12/20/2019 1634   GLUCOSE 121 (H) 04/11/2017 0450   BUN 8 12/20/2019 1634   CREATININE 0.56 (L) 12/20/2019 1634   CALCIUM 8.2 (L) 12/20/2019 1634   GFRNONAA 124 12/20/2019 1634   GFRAA 143 12/20/2019 1634    Lipid Panel     Component Value Date/Time   CHOL 263 (H) 12/20/2019 1634   TRIG 331 (H) 12/20/2019 1634   HDL 58 12/20/2019 1634   CHOLHDL 4.5 (H) 12/20/2019 1634   LDLCALC 145 (H) 12/20/2019 1634    CBC    Component  Value Date/Time   WBC 14.3 (H) 04/13/2017 1645   WBC 22.0 (H) 04/11/2017 0450   RBC 4.97 04/13/2017 1645   RBC 4.62 04/11/2017 0450   HGB 14.9 04/13/2017 1645   HCT 43.9 04/13/2017 1645   PLT 400 (H) 04/13/2017 1645   MCV 88 04/13/2017 1645   MCH 30.0 04/13/2017 1645   MCH 30.1 04/11/2017 0450   MCHC 33.9 04/13/2017 1645   MCHC 35.1 04/11/2017 0450   RDW 13.7 04/13/2017 1645   LYMPHSABS 3.6 (H) 04/13/2017 1645   MONOABS 1.4 (H) 04/11/2017 0450   EOSABS 0.1 04/13/2017 1645   BASOSABS 0.0 04/13/2017 1645    Hgb A1C Lab Results  Component Value Date   HGBA1C 5.8 12/02/2019     Assessment and Plan:   Nicki Reaper, NP This visit occurred  during the SARS-CoV-2 public health emergency.  Safety protocols were in place, including screening questions prior to the visit, additional usage of staff PPE, and extensive cleaning of exam room while observing appropriate contact time as indicated for disinfecting solutions.

## 2020-03-18 ENCOUNTER — Other Ambulatory Visit: Payer: Self-pay

## 2020-03-18 ENCOUNTER — Ambulatory Visit (INDEPENDENT_AMBULATORY_CARE_PROVIDER_SITE_OTHER): Payer: Self-pay | Admitting: Family Medicine

## 2020-03-18 ENCOUNTER — Encounter: Payer: Self-pay | Admitting: Family Medicine

## 2020-03-18 DIAGNOSIS — M545 Low back pain, unspecified: Secondary | ICD-10-CM

## 2020-03-18 DIAGNOSIS — G8929 Other chronic pain: Secondary | ICD-10-CM

## 2020-03-18 MED ORDER — METHOCARBAMOL 500 MG PO TABS: 500.0000 mg | ORAL_TABLET | Freq: Four times a day (QID) | ORAL | 0 refills | Status: DC | PRN

## 2020-03-18 MED ORDER — NAPROXEN 500 MG PO TABS: 500.0000 mg | ORAL_TABLET | Freq: Two times a day (BID) | ORAL | 0 refills | Status: DC

## 2020-03-18 NOTE — Patient Instructions (Signed)
It was great to meet you today! Thank you for letting me participate in your care!  Today, we discussed your back pain which is most likely due to an acute muscle strain. I have given you a muscle relaxer called Robaxin. Please take it every 6 hours for the first few days and then as needed. I also prescribed Naproxen to take twice per day. You can also take Tylenol extra strength with the Naproxen.   Be well, Jules Schick, DO PGY-3, [facility]

## 2020-03-18 NOTE — Progress Notes (Signed)
    SUBJECTIVE:   CHIEF COMPLAINT / HPI:   Low Back Pain Patient presents with back pain that has been present intermittently for several years. For several years, the pain would occur once every 4-5 months, last a few days, and get better with rest and OTC medications such as Ibuprofen and Tylenol. Over the past week she has had increasing frequency of back pain and it is now happening every day. Also, it is not responding to Tylenol and Ibuprofen like it used to. She did help move some furniture and a bed recently but did not notice anything that occurred while doing the activity. She denies any recent trauma or fall. The pain feels like the same pain she has had for years and radiates from her low back up her spine. She has also tried a heating pad with little relief. She does not have chest pain, fever, chills, constipation, loss of bowel or bladder function, saddle anesthesia, no urinary burning, increased frequency, no nausea or vomiting.   PERTINENT  PMH / PSH: HTN, Morbid Obesity, Prediabetes  OBJECTIVE:   BP 124/70   Pulse 100   Wt (!) 318 lb (144.2 kg)   SpO2 100%   BMI 56.33 kg/m   Gen: NAD MSK: Lumbar spine: - Inspection: no gross deformity or asymmetry, swelling or ecchymosis. No skin changes - Palpation: No TTP over the spinous processes, paraspinal muscles, or SI joints b/l. TTP over the lower lumbar musculature bilaterally - ROM: limited active ROM of the lumbar spine in flexion and extension due to pain - Strength: 5/5 strength of lower extremity in L4-S1 nerve root distributions b/l - Neuro: sensation intact in the L4-S1 nerve root distribution b/l, 2+ L4 and S1 reflexes - Special testing: Negative straight leg raise   ASSESSMENT/PLAN:   Low back pain Acute on chronic. Pain is most likely from an acute muscle spasm/strain from deconditioning and over activity. Cont with OTC Tylenol - Rest from activity for 5-7 days with gradual return. No lifting for 2 weeks, then  gradual increase. - Robaxin and Naproxen scheduled for the first week, then prn. - If no improvement in 14 days return to clinic and consider imaging.     Arlyce Harman, DO [place] [facility]

## 2020-03-23 DIAGNOSIS — M545 Low back pain, unspecified: Secondary | ICD-10-CM | POA: Insufficient documentation

## 2020-03-23 NOTE — Assessment & Plan Note (Signed)
Acute on chronic. Pain is most likely from an acute muscle spasm/strain from deconditioning and over activity. Cont with OTC Tylenol - Rest from activity for 5-7 days with gradual return. No lifting for 2 weeks, then gradual increase. - Robaxin and Naproxen scheduled for the first week, then prn. - If no improvement in 14 days return to clinic and consider imaging.

## 2020-06-03 ENCOUNTER — Encounter: Payer: Self-pay | Admitting: Family Medicine

## 2020-06-03 ENCOUNTER — Ambulatory Visit (INDEPENDENT_AMBULATORY_CARE_PROVIDER_SITE_OTHER): Payer: Self-pay | Admitting: Family Medicine

## 2020-06-03 ENCOUNTER — Other Ambulatory Visit: Payer: Self-pay

## 2020-06-03 VITALS — BP 142/70 | HR 93 | Ht 62.0 in | Wt 312.5 lb

## 2020-06-03 DIAGNOSIS — M25562 Pain in left knee: Secondary | ICD-10-CM

## 2020-06-03 DIAGNOSIS — R7303 Prediabetes: Secondary | ICD-10-CM

## 2020-06-03 HISTORY — DX: Pain in left knee: M25.562

## 2020-06-03 LAB — POCT GLYCOSYLATED HEMOGLOBIN (HGB A1C): HbA1c, POC (controlled diabetic range): 6.2 % (ref 0.0–7.0)

## 2020-06-03 MED ORDER — CYCLOBENZAPRINE HCL 5 MG PO TABS: 5.0000 mg | ORAL_TABLET | Freq: Every evening | ORAL | 1 refills | Status: DC | PRN

## 2020-06-03 MED ORDER — NAPROXEN 500 MG PO TABS: 500.0000 mg | ORAL_TABLET | Freq: Two times a day (BID) | ORAL | 0 refills | Status: DC

## 2020-06-03 NOTE — Progress Notes (Signed)
   SUBJECTIVE:   CHIEF COMPLAINT / HPI:   Chief Complaint  Patient presents with  . Knee Pain    Lt knee     Norma Ramsey is a 34 y.o. female here for knee pain.   Knee Pain: Patient presents with knee pain involving the left knee. Onset of the symptoms was about a month ago. Inciting event: none known. Current symptoms include giving out, locking, pain located anterior knee, popping sensation and stiffness. Pain is aggravated by any weight bearing. Patient has had no prior knee problems. Evaluation to date: none. Treatment to date: Ibuprofen, Naproxen which dulls the pain . Tried muscle relaxer and it helped but still unable to move like she wants to. Has to walk slow up and down steps. No falls or recent or past trauma to the leg.      PERTINENT  PMH / PSH: reviewed and updated as appropriate   OBJECTIVE:   BP (!) 142/70   Pulse 93   Ht 5\' 2"  (1.575 m)   Wt (!) 312 lb 8 oz (141.7 kg)   SpO2 99%   BMI 57.16 kg/m     Right knee: normal and no effusion, full active range of motion, no joint line tenderness, ligamentous structures intact  Left Knee Exam: -Inspection: no deformity, no discoloration -Palpation: no joint line tenderness -ROM: normal active and passive  -Patient able to extend and flex his knee on her own, 5/5 strength testing -Special Tests: Varus Stress: Negative; Valgus Stress: Negative; Anterior drawer: Negative; Posterior drawer: Negative; McMurray: Negative; Patellar grind: Negative -Limb neurovascularly intact, no instability noted    ASSESSMENT/PLAN:   Acute pain of left knee Seemingly unprovoked anterior left knee pain.  DDx osteoarthritis, ligamentous strain, bursitis, tendonitis  - Flexeril TID PRN  - Naprosyn BID  - Obtain left knee xray      Natural history and expected course discussed. Questions answered. Rest, ice, compression, and elevation (RICE) therapy. Reduction in offending activity. NSAIDs per medication orders. Plain film  x-rays.   , DO PGY-2, [facility] 06/03/2020

## 2020-06-03 NOTE — Assessment & Plan Note (Signed)
a1c 6.2 today. Discussed need for weight loss and healthy eating habits. Handout provided in AVS.

## 2020-06-03 NOTE — Assessment & Plan Note (Addendum)
Seemingly unprovoked anterior left knee pain.  DDx osteoarthritis, ligamentous strain, bursitis, tendonitis  - Flexeril TID PRN  - Naprosyn BID  - Obtain left knee xray

## 2020-06-03 NOTE — Patient Instructions (Signed)
It was great seeing you today!  Visit Remembers: - Stop by the pharmacy to pick up your prescriptions  - Continue to work on your healthy eating habits and incorporating exercise into your daily life. (see below) - Medicine Changes: Start taking Flexeril (muscle relaxer) at bedtime.  Take Naprosyn (Naproxen sodium) twice daily for the next week. - Go to [facility] to get your knee x-rayed (see information slip provided)    Diet Recommendations for Prediabetes  Carbohydrate includes starch, sugar, and fiber.  Of these, only sugar and starch raise blood glucose.  (Fiber is found in fruits, vegetables [especially skin, seeds, and stalks] and whole grains.)   Starchy (carb) foods: Bread, rice, pasta, potatoes, corn, cereal, grits, crackers, bagels, muffins, all baked goods.  (Fruit, milk, and yogurt also have carbohydrate, but most of these foods will not spike your blood sugar as most starchy foods will.)  A few fruits do cause high blood sugars; use small portions of bananas (limit to 1/2 at a time), grapes, watermelon, oranges, and most tropical fruits.   Protein foods: Meat, fish, poultry, eggs, dairy foods, and beans such as pinto and kidney beans (beans also provide carbohydrate).   1. Eat at least REAL 3 meals and 1-2 snacks per day. Never go more than 4-5 hours while awake without eating. Eat breakfast within the first hour of getting up.   2. Limit starchy foods to TWO per meal and ONE per snack. ONE portion of a starchy food is equal to the following:   - ONE slice of bread (or its equivalent, such as half of a hamburger bun).   - 1/2 cup of a "scoopable" starchy food such as potatoes or rice.   - 15 grams of Total Carbohydrate as shown on food label.   - Every 4 ounces of a sweet drink (including fruit juice). 3. Include at every meal: a protein food, a carb food, and vegetables and/or fruit.   - Obtain twice the volume of veg's as protein or carbohydrate foods for both lunch and  dinner.   - Fresh or frozen veg's are best.   - Keep frozen veg's on hand for a quick vegetable serving.        Feel free to call with any questions or concerns at any time, at [phone].   Take care,  Dr. Katherina [facility]

## 2020-06-10 ENCOUNTER — Ambulatory Visit
Admission: RE | Admit: 2020-06-10 | Discharge: 2020-06-10 | Disposition: A | Discharge status: Home / Self Care | Payer: Medicaid Other | Source: Ambulatory Visit | Attending: Family Medicine | Admitting: Family Medicine

## 2020-06-10 ENCOUNTER — Other Ambulatory Visit: Payer: Self-pay

## 2020-06-10 DIAGNOSIS — M25562 Pain in left knee: Secondary | ICD-10-CM

## 2020-06-15 ENCOUNTER — Telehealth: Payer: Self-pay

## 2020-06-15 NOTE — Telephone Encounter (Signed)
Patient calls nurse line regarding X- ray results. Informed of normal impression.   Please advise next steps for patient moving forward.   Veronda Prude, RN

## 2020-06-15 NOTE — Telephone Encounter (Signed)
Called patient and informed of below. Patient has scheduled OV next Monday, 9/20. Advised patient to discuss at next visit if pain has not improved.   Veronda Prude, RN

## 2020-06-21 NOTE — Progress Notes (Signed)
    SUBJECTIVE:   CHIEF COMPLAINT / HPI: Pap smear   Norma Ramsey is a 34 year old female presenting for her Pap smear and to discuss the following:  Knee pain: Present for approximately 2 months now.  Atraumatic.  Seen in our clinic on 9/1 for this concern involving the left knee, Rx naproxen and rice therapy. X-ray 4 views of left knee normal without evidence of joint narrowing or significant effusion.  She feels like the pain is the same, feels like it will give out on her often.  Seems to hurt in the back like a "pulling sensation "and then wraps around to the bilateral front of her knee. Hears a lot of popping whenever she moves from positions.  No locking for long periods.  No preceding trauma or injury.  She is frustrated is continue to bother her, does have a history of back pain and wonders if she walks differently for too long.  Health maintenance: Due for the second dose of her Covid vaccine.  Reports that she received her first within our clinic on 9/1.   PERTINENT  PMH / PSH: hypertension, elevated BMI, pre-diabetes  OBJECTIVE:   BP 135/85   Pulse 92   Ht 5\' 4"  (1.626 m)   Wt (!) 319 lb 3.2 oz (144.8 kg)   SpO2 96%   BMI 54.79 kg/m   General: Alert, NAD HEENT: NCAT, MMM Lungs: No increased WOB   Ext: Warm, dry, 2+ distal pulses, no edema  Pelvic exam: VULVA: normal appearing vulva with no masses, tenderness or lesions, VAGINA: vaginal discharge - white, creamy CERVIX: normal appearing cervix without lesions, no cervical motion tenderness MSK:  Left Knee in comparison to right: - Inspection: no gross deformity. No swelling/effusion, erythema or bruising. Skin intact - Palpation: TTP around lateral joint line and along lateral hamstring at insertion site - ROM: full active ROM with flexion and extension in knee and hip - Strength: 5/5 strength, however pain with knee flexion in posterior thigh - Neuro/vasc: NV intact - Special Tests: - LIGAMENTS: Negative Lachman's, no MCL or  LCL laxity  -- MENISCUS: Negative Thessaly (however did have pain in posterior knee with bending down) -- PF JOINT: nml patellar mobility bilaterally, no J sign.    ASSESSMENT/PLAN:   Well woman exam with routine gynecological exam Pap smear performed today, unremarkable pelvic exam.    Screening examination for STD (sexually transmitted disease) Included wet prep, GC/CH screening during Pap smear. Pt opted against HIV/RPR/Hep C screening today. Wet prep positive for BV, patient opted to treat with Flagyl BID x7 days.   Posterior left knee pain Now subacute/chronic, not improving with conservative NSAIDs/rest/ice.  Given increased pain with knee flexion and tenderness along distal tendon insertion, do wonder if she is experiencing a biceps femoris strain rather than acute knee pathology as otherwise no acute ligamentous laxity noted.  Could also consider IT band syndrome, however would expect pain to be more anterior.  Rx'd an additional meloxicam course.  Encouraged trial of Voltaren gel QID, ice/heat, and rest.  Will refer to PT for further evaluation given persistence of pain.  Healthcare maintenance Unfortunately too early for second Covid dose today.  We will keep our an appointment for 9/22 to receive second Pfizer vaccine.    Follow up in 1 month if not improving or sooner if worsening.   10/22, DO [facility]

## 2020-06-22 ENCOUNTER — Other Ambulatory Visit (HOSPITAL_COMMUNITY)
Admission: RE | Admit: 2020-06-22 | Discharge: 2020-06-22 | Disposition: A | Discharge status: Home / Self Care | Payer: Medicaid Other | Source: Ambulatory Visit | Attending: Family Medicine | Admitting: Family Medicine

## 2020-06-22 ENCOUNTER — Ambulatory Visit (INDEPENDENT_AMBULATORY_CARE_PROVIDER_SITE_OTHER): Payer: Medicaid Other | Admitting: Family Medicine

## 2020-06-22 ENCOUNTER — Other Ambulatory Visit: Payer: Self-pay

## 2020-06-22 ENCOUNTER — Encounter: Payer: Self-pay | Admitting: Family Medicine

## 2020-06-22 VITALS — BP 135/85 | HR 92 | Ht 64.0 in | Wt 319.2 lb

## 2020-06-22 DIAGNOSIS — M25562 Pain in left knee: Secondary | ICD-10-CM | POA: Diagnosis not present

## 2020-06-22 DIAGNOSIS — B9689 Other specified bacterial agents as the cause of diseases classified elsewhere: Secondary | ICD-10-CM | POA: Diagnosis not present

## 2020-06-22 DIAGNOSIS — Z113 Encounter for screening for infections with a predominantly sexual mode of transmission: Secondary | ICD-10-CM

## 2020-06-22 DIAGNOSIS — Z124 Encounter for screening for malignant neoplasm of cervix: Secondary | ICD-10-CM | POA: Diagnosis not present

## 2020-06-22 DIAGNOSIS — N760 Acute vaginitis: Secondary | ICD-10-CM

## 2020-06-22 DIAGNOSIS — Z01419 Encounter for gynecological examination (general) (routine) without abnormal findings: Secondary | ICD-10-CM | POA: Insufficient documentation

## 2020-06-22 DIAGNOSIS — Z0000 Encounter for general adult medical examination without abnormal findings: Secondary | ICD-10-CM

## 2020-06-22 DIAGNOSIS — Z01411 Encounter for gynecological examination (general) (routine) with abnormal findings: Secondary | ICD-10-CM

## 2020-06-22 LAB — POCT WET PREP (WET MOUNT)
Clue Cells Wet Prep Whiff POC: POSITIVE
Trichomonas Wet Prep HPF POC: ABSENT

## 2020-06-22 MED ORDER — MELOXICAM 7.5 MG PO TABS: 7.5000 mg | ORAL_TABLET | Freq: Every day | ORAL | 0 refills | Status: AC

## 2020-06-22 NOTE — Patient Instructions (Signed)
It was wonderful seeing you today.  Your Pap smear results should be back in the next several days.  I am so sorry that your knee continues to bother you.  I have sent in another prescription of anti-inflammatory and pain medication that you can use on a daily basis.  Please continue to ice your leg and the back 20 minutes at a time several times a day and elevate it when you can.  Lastly, you can get Voltaren gel which should be at your pharmacy over-the-counter and use this on the area about 4 times a day for added muscle relief.  I have placed a referral to physical therapy, you should hear from them in the next 2 weeks if not please call our clinic and let us know.  If it is not getting better after physical therapy, we can discuss sending you over sports medicine for further evaluation.

## 2020-06-22 NOTE — Assessment & Plan Note (Addendum)
Pap smear performed today, unremarkable pelvic exam.

## 2020-06-22 NOTE — Assessment & Plan Note (Deleted)
Now subacute/chronic, not improving with conservative NSAIDs/rest/ice.

## 2020-06-23 MED ORDER — METRONIDAZOLE 500 MG PO TABS: 500.0000 mg | ORAL_TABLET | Freq: Two times a day (BID) | ORAL | 0 refills | Status: AC

## 2020-06-24 ENCOUNTER — Ambulatory Visit: Payer: Medicaid Other

## 2020-06-24 DIAGNOSIS — M25562 Pain in left knee: Secondary | ICD-10-CM | POA: Insufficient documentation

## 2020-06-24 DIAGNOSIS — Z113 Encounter for screening for infections with a predominantly sexual mode of transmission: Secondary | ICD-10-CM | POA: Insufficient documentation

## 2020-06-24 LAB — CERVICOVAGINAL ANCILLARY ONLY
Chlamydia: NEGATIVE
Comment: NEGATIVE
Comment: NORMAL
Neisseria Gonorrhea: NEGATIVE

## 2020-06-24 NOTE — Assessment & Plan Note (Addendum)
Now subacute/chronic, not improving with conservative NSAIDs/rest/ice.  Given increased pain with knee flexion and tenderness along distal tendon insertion, do wonder if she is experiencing a biceps femoris strain rather than acute knee pathology as otherwise no acute ligamentous laxity noted.  Could also consider IT band syndrome, however would expect pain to be more anterior.  Rx'd an additional meloxicam course.  Encouraged trial of Voltaren gel QID, ice/heat, and rest.  Will refer to PT for further evaluation given persistence of pain.

## 2020-06-24 NOTE — Assessment & Plan Note (Addendum)
Included wet prep, GC/CH screening during Pap smear. Pt opted against HIV/RPR/Hep C screening today. Wet prep positive for BV, patient opted to treat with Flagyl BID x7 days.

## 2020-06-25 ENCOUNTER — Encounter: Payer: Self-pay | Admitting: Family Medicine

## 2020-06-25 DIAGNOSIS — Z0000 Encounter for general adult medical examination without abnormal findings: Secondary | ICD-10-CM | POA: Insufficient documentation

## 2020-06-25 LAB — CYTOLOGY - PAP
Comment: NEGATIVE
Diagnosis: NEGATIVE
Diagnosis: REACTIVE
High risk HPV: NEGATIVE

## 2020-06-25 NOTE — Assessment & Plan Note (Signed)
Unfortunately too early for second Covid dose today.  We will keep our an appointment for 9/22 to receive second Pfizer vaccine.

## 2020-08-14 ENCOUNTER — Ambulatory Visit: Payer: Medicaid Other

## 2021-02-22 ENCOUNTER — Encounter (HOSPITAL_COMMUNITY): Payer: Self-pay

## 2021-02-22 ENCOUNTER — Encounter: Payer: Self-pay | Admitting: Family Medicine

## 2021-02-22 ENCOUNTER — Other Ambulatory Visit: Payer: Self-pay

## 2021-02-22 ENCOUNTER — Ambulatory Visit (HOSPITAL_COMMUNITY)
Admission: EM | Admit: 2021-02-22 | Discharge: 2021-02-22 | Disposition: A | Discharge status: Home / Self Care | Payer: Medicaid Other | Attending: Emergency Medicine | Admitting: Emergency Medicine

## 2021-02-22 DIAGNOSIS — M545 Low back pain, unspecified: Secondary | ICD-10-CM

## 2021-02-22 DIAGNOSIS — M25562 Pain in left knee: Secondary | ICD-10-CM

## 2021-02-22 DIAGNOSIS — M546 Pain in thoracic spine: Secondary | ICD-10-CM

## 2021-02-22 MED ORDER — NAPROXEN 500 MG PO TABS: 500.0000 mg | ORAL_TABLET | Freq: Two times a day (BID) | ORAL | 0 refills | Status: DC

## 2021-02-22 MED ORDER — CYCLOBENZAPRINE HCL 10 MG PO TABS: 5.0000 mg | ORAL_TABLET | Freq: Two times a day (BID) | ORAL | 0 refills | Status: DC | PRN

## 2021-02-22 NOTE — Discharge Instructions (Signed)
Take naproxen twice a day for the next 5-7 days then as needed  Can use muscle relaxer twice a day as needed for additional comfort, be mindful this medication may make you drowsy  Can use heating pad in 15 minute intervals  Gentle stretch daily as tolerated
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
## 2021-02-23 ENCOUNTER — Ambulatory Visit: Payer: Medicaid Other | Admitting: Family Medicine

## 2021-02-26 DIAGNOSIS — Z20822 Contact with and (suspected) exposure to covid-19: Secondary | ICD-10-CM | POA: Diagnosis not present

## 2021-03-02 ENCOUNTER — Other Ambulatory Visit: Payer: Self-pay

## 2021-03-02 MED ORDER — AMLODIPINE BESYLATE 10 MG PO TABS
10.0000 mg | ORAL_TABLET | Freq: Every day | ORAL | 0 refills | Status: DC
Start: 2021-03-02 — End: 2021-07-24

## 2021-03-15 ENCOUNTER — Telehealth: Payer: Self-pay

## 2021-03-15 ENCOUNTER — Emergency Department (HOSPITAL_BASED_OUTPATIENT_CLINIC_OR_DEPARTMENT_OTHER)
Admission: EM | Admit: 2021-03-15 | Discharge: 2021-03-15 | Disposition: A | Discharge status: Home / Self Care | Payer: Medicaid Other | Attending: Emergency Medicine | Admitting: Emergency Medicine

## 2021-03-15 ENCOUNTER — Encounter (HOSPITAL_BASED_OUTPATIENT_CLINIC_OR_DEPARTMENT_OTHER): Payer: Self-pay | Admitting: *Deleted

## 2021-03-15 ENCOUNTER — Emergency Department (HOSPITAL_BASED_OUTPATIENT_CLINIC_OR_DEPARTMENT_OTHER): Payer: Medicaid Other | Admitting: Radiology

## 2021-03-15 ENCOUNTER — Other Ambulatory Visit: Payer: Self-pay

## 2021-03-15 DIAGNOSIS — U071 COVID-19: Secondary | ICD-10-CM | POA: Diagnosis not present

## 2021-03-15 DIAGNOSIS — F17210 Nicotine dependence, cigarettes, uncomplicated: Secondary | ICD-10-CM | POA: Insufficient documentation

## 2021-03-15 DIAGNOSIS — I10 Essential (primary) hypertension: Secondary | ICD-10-CM | POA: Insufficient documentation

## 2021-03-15 DIAGNOSIS — R0600 Dyspnea, unspecified: Secondary | ICD-10-CM

## 2021-03-15 DIAGNOSIS — R0602 Shortness of breath: Secondary | ICD-10-CM | POA: Diagnosis not present

## 2021-03-15 DIAGNOSIS — Z79899 Other long term (current) drug therapy: Secondary | ICD-10-CM | POA: Insufficient documentation

## 2021-03-15 NOTE — Telephone Encounter (Signed)
Agree with UC evaluation.

## 2021-03-15 NOTE — Telephone Encounter (Signed)
Patient calls nurse line regarding concerns with COVID. Patient tested positive on Wednesday, 6/8. Reports nausea, increased drainage, coughing, SHOB with exertion, and painful urination. Patient also states that she has noticed tingling in face, hands and legs. This has been going on for a couple of days.   Due to above mentioned concerns. Advised that patient be evaluated in UC/ ED. Patient verbalizes understanding.   Veronda Prude, RN

## 2021-03-15 NOTE — ED Triage Notes (Signed)
Self tested Covid + June 8th. C/o [facility] as reason to be seen today. Other symptoms resolved.

## 2021-03-15 NOTE — Discharge Instructions (Addendum)
1.  At this time your chest x-ray is clear.  Your oxygen levels are in 100%.  You may still be experiencing shortness of breath from recent COVID illness.  Try to rest and also you must take your blood pressure medication.  Take the full dose 10 mg when you get home.  Then take your blood pressure medication tomorrow morning.  It a blood pressure cuff and monitor your blood pressures at home. 2.  Follow-up with your doctor this week for recheck.  If your blood pressures remain elevated you may need an additional medication. 3.  Return to the emergency department if you develop worsening chest pain, shortness of breath or other concerning symptoms.

## 2021-03-15 NOTE — ED Provider Notes (Signed)
[facility] EMERGENCY DEPT Provider Note   CSN: [id number] Arrival date & time: 03/15/21  1143     History No chief complaint on file.   Norma Ramsey is a 35 y.o. female.  HPI Patient has a positive for COVID approximately 5 days ago.  She reports she has had cough although cough is improving.  She still feels short of breath.  She reports more short of breath with exertion.  Occasional chest pains migratory in nature.  No lower extremity swelling or calf pain.  No ongoing fever.  Patient reports she did have both vomiting and diarrhea with illness but symptoms are improving.  She reports sometimes has been feeling all tingling around her mouth as well which was concerning.  No other focal weakness numbness or tingling,no headache, no visual change.  Patient reports she has not taken her blood pressure medication today.  She reports she takes amlodipine 5 mg.  She has not been monitoring her blood pressures at home to no recent values.    Past Medical History:  Diagnosis Date   Acute pain of left knee 06/03/2020   Hypertension    Obesity    Prediabetes    Tobacco abuse     Patient Active Problem List   Diagnosis Date Noted   Healthcare maintenance 06/25/2020   Screening examination for STD (sexually transmitted disease) 06/24/2020   Posterior left knee pain 06/24/2020   Well woman exam with routine gynecological exam 06/22/2020   Low back pain 03/23/2020   Morbid obesity (HCC) 12/02/2019   Prediabetes 12/02/2019   Essential hypertension 03/21/2017    Past Surgical History:  Procedure Laterality Date   WISDOM TOOTH EXTRACTION  09/2016     OB History   No obstetric history on file.     Family History  Problem Relation Age of Onset   Hypertension Mother    Hypertension Father    Diabetes Paternal Grandfather    AAA (abdominal aortic aneurysm) Paternal Grandfather     Social History   Tobacco Use   Smoking status: Every Day    Packs/day: 0.30     Pack years: 0.00    Types: Cigarettes    Start date: 11/26/2011   Smokeless tobacco: Never  Substance Use Topics   Alcohol use: Yes    Alcohol/week: 1.0 standard drink    Types: 1 Glasses of wine per week    Comment: every day   Drug use: Yes    Types: Marijuana    Comment: every day    Home Medications Prior to Admission medications   Medication Sig Start Date End Date Taking? Authorizing Provider  amLODipine (NORVASC) 10 MG tablet Take 1 tablet (10 mg total) by mouth daily. Patient needs office visit before next refill. 03/02/21   Westley Chandler, MD  cyclobenzaprine (FLEXERIL) 10 MG tablet Take 0.5 tablets (5 mg total) by mouth 2 (two) times daily as needed for muscle spasms. 02/22/21   Valinda Hoar, NP  etonogestrel (NEXPLANON) 68 MG IMPL implant 1 each by Subdermal route once.    [provider]  naproxen (NAPROSYN) 500 MG tablet Take 1 tablet (500 mg total) by mouth 2 (two) times daily. 02/22/21   Valinda Hoar, NP    Allergies    Patient has no known allergies.  Review of Systems   Review of Systems 10 systems reviewed negative except as per HPI Physical Exam Updated Vital Signs BP (!) 155/128 (BP Location: Right Arm)   Pulse 100  Temp 98.6 F (37 C)   Resp 20   Ht 5\' 3"  (1.6 m)   Wt (!) 141.5 kg   SpO2 100%   BMI 55.27 kg/m   Physical Exam Constitutional:      Appearance: Normal appearance.     Comments: Well in appearance.  No respiratory distress.  HENT:     Mouth/Throat:     Pharynx: Oropharynx is clear.  Eyes:     Extraocular Movements: Extraocular movements intact.  Cardiovascular:     Rate and Rhythm: Normal rate and regular rhythm.  Pulmonary:     Effort: Pulmonary effort is normal.     Breath sounds: Normal breath sounds.  Abdominal:     General: There is no distension.     Palpations: Abdomen is soft.     Tenderness: There is no abdominal tenderness. There is no guarding.  Musculoskeletal:        General: Normal range of  motion.     Comments: No peripheral edema.  Calves are soft and nontender.  Skin:    General: Skin is warm and dry.  Neurological:     General: No focal deficit present.     Mental Status: She is alert and oriented to person, place, and time.     Motor: No weakness.     Coordination: Coordination normal.  Psychiatric:        Mood and Affect: Mood normal.    ED Results / Procedures / Treatments   Labs (all labs ordered are listed, but only abnormal results are displayed) Labs Reviewed - No data to display  EKG EKG Interpretation  Date/Time:  Monday March 15 2021 11:54:51 EDT Ventricular Rate:  109 PR Interval:  130 QRS Duration: 74 QT Interval:  356 QTC Calculation: 479 R Axis:   76 Text Interpretation: Sinus tachycardia Otherwise normal ECG rate increased from prior 7/18 Confirmed by 8/18 [phone]) on 03/15/2021 12:04:35 PM  Radiology DG Chest 2 View  Result Date: 03/15/2021 CLINICAL DATA:  Short of breath.  COVID positive. EXAM: CHEST - 2 VIEW COMPARISON:  04/10/2017 FINDINGS: The heart size and mediastinal contours are within normal limits. Both lungs are clear. The visualized skeletal structures are unremarkable. IMPRESSION: No active cardiopulmonary disease. Electronically Signed   By: 06/11/2017 M.D.   On: 03/15/2021 12:17    Procedures Procedures   Medications Ordered in ED Medications - No data to display  ED Course  I have reviewed the triage vital signs and the nursing notes.  Pertinent labs & imaging results that were available during my care of the patient were reviewed by me and considered in my medical decision making (see chart for details).    MDM Rules/Calculators/A&P                          Patient is alert nontoxic.  No respiratory distress.  EKG without any ischemic changes.  Chest  x-ray clear.  No lower extremity edema or calf pain.  Oxygenation is 100% on room air.  At this time patient is still recovering from COVID.  She however  does not show signs of complications of secondary pneumonia or diffuse pneumonia.  EKG does not have ischemic changes.  This time, I suspect patient is still recovering from COVID although does not require further treatment for this at this time.  She is hypertensive.  Patient has hypertension but has not taken medications today.  Patient reports being on amlodipine 5  mg.  She is counseled to take 10 mg daily and get a home monitoring machine.  Recommended to follow-up with PCP this week for continued blood pressure monitoring and recheck for dyspnea.  Alert and well at discharge without respiratory distress and clear mental status.  Follow-up plan reviewed. Final Clinical Impression(s) / ED Diagnoses Final diagnoses:  Dyspnea, unspecified type  COVID  Essential hypertension    Rx / DC Orders ED Discharge Orders     None        Arby Barrette, MD 03/15/21 1526

## 2021-03-17 ENCOUNTER — Telehealth: Payer: Self-pay

## 2021-03-17 ENCOUNTER — Other Ambulatory Visit: Payer: Self-pay

## 2021-03-17 ENCOUNTER — Telehealth: Payer: Self-pay | Admitting: Family Medicine

## 2021-03-17 ENCOUNTER — Encounter: Payer: Self-pay | Admitting: Student in an Organized Health Care Education/Training Program

## 2021-03-17 ENCOUNTER — Ambulatory Visit (INDEPENDENT_AMBULATORY_CARE_PROVIDER_SITE_OTHER): Payer: BC Managed Care – PPO | Admitting: Student in an Organized Health Care Education/Training Program

## 2021-03-17 VITALS — BP 159/118 | HR 117 | Ht 63.0 in | Wt 308.8 lb

## 2021-03-17 DIAGNOSIS — R197 Diarrhea, unspecified: Secondary | ICD-10-CM | POA: Diagnosis not present

## 2021-03-17 DIAGNOSIS — I10 Essential (primary) hypertension: Secondary | ICD-10-CM | POA: Diagnosis not present

## 2021-03-17 DIAGNOSIS — U071 COVID-19: Secondary | ICD-10-CM | POA: Diagnosis not present

## 2021-03-17 DIAGNOSIS — R112 Nausea with vomiting, unspecified: Secondary | ICD-10-CM

## 2021-03-17 DIAGNOSIS — R0789 Other chest pain: Secondary | ICD-10-CM | POA: Diagnosis not present

## 2021-03-17 NOTE — Telephone Encounter (Signed)
Informed patient of CT Angio Chest at :  [facility] hospital 0830 [id number] arrival Liquid only 4 hours prior to test.  Patient verbalized understanding.  Glennie Hawk, CMA

## 2021-03-17 NOTE — Telephone Encounter (Signed)
**  After Hours/ Emergency Line Call**  Received a page to call Norma Ramsey.  Pt states she is shortness of breath and experiencing dizziness. Reports her BP's are elevated. She was diagnosed with COVID about a week ago. States she has to go to work today. Denying fever.  Recommended pt be evaluated in the ED. There is concern for PE given recent COVID dx. Will forward to PCP.  Katha Cabal, DO PGY-2, [facility] 03/17/2021

## 2021-03-17 NOTE — Progress Notes (Signed)
SUBJECTIVE:   CHIEF COMPLAINT / HPI: COVID+ f/u  Tested positive on 6/8 after two co-workers tested positive for COVID. She was asymptomatic until  6/11 and then endorses rapid worsening of symptoms that were primarily N/V/D and SOB since Saturday. She was unable to tolerate a diet. When she stood from laying down, got dizzy and fell back down but no LOC. Went to ED and had no hypoxia. Had a fever on Saturday (102) and resolved with tylenol. Sunday was 100.3 and no other tylenol or fevers since then. Occasionally taking naproxen every couple days for knee pain. Not associated with upset stomach. She is able to eat and drink but has decreased appetite. Had chicken wings and rice yesterday.  She was planning on going back to work today but started feeling worse last night.  Watery diarrhea about every hour since last night. Non-bloody. None today. Vomiting maybe 2-3 times per day. None today. Also has abdominal cramping. Endorses some shortness of breath on exertion but no chest pain or leg swelling. No chance she could be pregnant.   Her heart rate is typically elevated in high 90s-low100s at baseline.  OBJECTIVE:   BP (!) 159/118   Pulse (!) 117   Ht 5\' 3"  (1.6 m)   Wt (!) 308 lb 12.8 oz (140.1 kg)   SpO2 100%   BMI 54.70 kg/m   Physical Exam Vitals and nursing note reviewed.  Constitutional:      General: She is not in acute distress.    Appearance: She is obese. She is not ill-appearing or toxic-appearing.  Cardiovascular:     Rate and Rhythm: Regular rhythm. Tachycardia present. No extrasystoles are present.    Heart sounds: Normal heart sounds. No murmur heard. Pulmonary:     Effort: Pulmonary effort is normal. No tachypnea, accessory muscle usage or respiratory distress.     Breath sounds: Normal breath sounds and air entry. No stridor or decreased air movement. No wheezing, rhonchi or rales.  Abdominal:     Palpations: Abdomen is soft. There is no mass.     Tenderness:  There is no abdominal tenderness. There is no guarding.  Musculoskeletal:     Right lower leg: No edema.     Left lower leg: No edema.  Skin:    General: Skin is warm and moist.     Capillary Refill: Capillary refill takes less than 2 seconds.  Neurological:     General: No focal deficit present.     Mental Status: She is alert and oriented to person, place, and time.  Psychiatric:        Mood and Affect: Mood normal.        Behavior: Behavior normal.   ASSESSMENT/PLAN:   Acute COVID-19 Remains symptomatic with primarily GI symptoms.  Afebrile and slightly more tachycardic than her baseline and hypertensive. Her oxygen saturation was 100% after ambulating from car to the exam room ORA. Her exam is overall reassuring and normal. Recent chest xray was negative. Throughout extended time at office, she had no episodes of cough, diarrhea, or vomiting. Endorses nausea.  BMP, CBC to assess electrolytes and infectious signs.  Recommended good hydration and close follow up and continue to stay home for isolation. precepted with her PCP, Dr. who saw patient and requested stat CTA to evaluate for PE. No PE was seen on imaging and patient was informed by her PCP.   Essential hypertension Follow up with PCP for HTN management when feeling better.  [place]

## 2021-03-18 ENCOUNTER — Telehealth: Payer: Self-pay | Admitting: Family Medicine

## 2021-03-18 ENCOUNTER — Ambulatory Visit (HOSPITAL_COMMUNITY)
Admission: RE | Admit: 2021-03-18 | Discharge: 2021-03-18 | Disposition: A | Discharge status: Home / Self Care | Payer: BC Managed Care – PPO | Source: Ambulatory Visit | Attending: Family Medicine | Admitting: Family Medicine

## 2021-03-18 ENCOUNTER — Encounter: Payer: Self-pay | Admitting: Family Medicine

## 2021-03-18 DIAGNOSIS — R0789 Other chest pain: Secondary | ICD-10-CM

## 2021-03-18 DIAGNOSIS — R0602 Shortness of breath: Secondary | ICD-10-CM | POA: Diagnosis not present

## 2021-03-18 DIAGNOSIS — R079 Chest pain, unspecified: Secondary | ICD-10-CM | POA: Diagnosis not present

## 2021-03-18 LAB — CBC
Hematocrit: 37.9 % (ref 34.0–46.6)
Hemoglobin: 13.7 g/dL (ref 11.1–15.9)
MCH: 36.6 pg — ABNORMAL HIGH (ref 26.6–33.0)
MCHC: 36.1 g/dL — ABNORMAL HIGH (ref 31.5–35.7)
MCV: 101 fL — ABNORMAL HIGH (ref 79–97)
Platelets: 413 x10E3/uL (ref 150–450)
RBC: 3.74 x10E6/uL — ABNORMAL LOW (ref 3.77–5.28)
RDW: 13 % (ref 11.7–15.4)
WBC: 10.7 x10E3/uL (ref 3.4–10.8)

## 2021-03-18 LAB — BASIC METABOLIC PANEL
BUN/Creatinine Ratio: 13 (ref 9–23)
BUN: 8 mg/dL (ref 6–20)
CO2: 21 mmol/L (ref 20–29)
Calcium: 7.5 mg/dL — ABNORMAL LOW (ref 8.7–10.2)
Chloride: 97 mmol/L (ref 96–106)
Creatinine, Ser: 0.64 mg/dL (ref 0.57–1.00)
Glucose: 125 mg/dL — ABNORMAL HIGH (ref 65–99)
Potassium: 3.5 mmol/L (ref 3.5–5.2)
Sodium: 140 mmol/L (ref 134–144)
eGFR: 120 mL/min/{1.73_m2} (ref >59)

## 2021-03-18 MED ORDER — SODIUM CHLORIDE (PF) 0.9 % IJ SOLN
INTRAMUSCULAR | Status: AC
  Filled 2021-03-18: qty 50

## 2021-03-18 MED ORDER — IOHEXOL 350 MG/ML SOLN
100.0000 mL | Freq: Once | INTRAVENOUS | Status: AC | PRN
  Administered 2021-03-18: 100 mL via INTRAVENOUS

## 2021-03-18 NOTE — Telephone Encounter (Signed)
Called patient with results. Has picked up Zofran, tolerated fluids this AM. Has not taken zofran yet.  Reviewed CT results, discussed fatty liver. Reviewed labs.  At follow up- needs CBC, B12, hepatic panel, consider A1C and lipids.   Scheduled for annual exam with Dr. Allena Katz per patient preference.  All questions answered.

## 2021-03-19 DIAGNOSIS — U071 COVID-19: Secondary | ICD-10-CM | POA: Insufficient documentation

## 2021-03-19 NOTE — Assessment & Plan Note (Signed)
Remains symptomatic with primarily GI symptoms.  Afebrile and slightly more tachycardic than her baseline and hypertensive. Her oxygen saturation was 100% after ambulating from car to the exam room ORA. Her exam is overall reassuring and normal. Recent chest xray was negative. Throughout extended time at office, she had no episodes of cough, diarrhea, or vomiting. Endorses nausea.  BMP, CBC to assess electrolytes and infectious signs.  Recommended good hydration and close follow up and continue to stay home for isolation. precepted with her PCP, Dr. Manson Passey who saw patient and requested stat CTA to evaluate for PE. No PE was seen on imaging and patient was informed by her PCP.

## 2021-03-19 NOTE — Assessment & Plan Note (Signed)
Follow up with PCP for HTN management when feeling better.

## 2021-03-20 NOTE — Telephone Encounter (Signed)
Sure will do

## 2021-04-16 ENCOUNTER — Ambulatory Visit: Payer: BC Managed Care – PPO | Admitting: Family Medicine

## 2021-04-23 ENCOUNTER — Ambulatory Visit: Payer: BC Managed Care – PPO | Admitting: Student

## 2021-05-11 ENCOUNTER — Ambulatory Visit: Payer: BC Managed Care – PPO | Admitting: Family Medicine

## 2021-06-02 NOTE — Progress Notes (Deleted)
    SUBJECTIVE:   CHIEF COMPLAINT / HPI: Annual  Annual Examination Female  under 34 yo  I reviewed the following patient responses on our Physical Exam Form Tobacco use  Alcohol Use  Weight, 308lb   Exercise  Risk for STI  Increased family cancer risk Violence risk  PHQ9 score reviewed  Blood pressure reviewed  I considered the following items based upon USPSTF recommendations: HIV testing: Negative in 2021 Hepatitis C testing, completed and negative Cholesterol screening, recommended*** STI screening if high risk (Hepatitis B, Syphilis, Gonorrhea, Chlamydia) Cervical Cancer, Pap smear completed and negative in 2021 Immunizations - Influenza, Covid, Pneumonia   PERTINENT  PMH / PSH:  Hypertension Prediabetes Morbid obesity   OBJECTIVE:   There were no vitals taken for this visit.  General: Female appearing stated age in no acute distress HEENT: MMM, no oral lesions noted,Neck non-tender without lymphadenopathy Cardio: Normal S1 and S2, no S3 or S4. Rhythm is regular. No murmurs or rubs.  Bilateral radial pulses palpable Pulm: Clear to auscultation bilaterally, no crackles, wheezing, or diminished breath sounds. Normal respiratory effort Abdomen: Bowel sounds normal. Abdomen soft and non-tender.  Extremities: No peripheral edema. Warm & well perfused.  Neuro: pt alert and oriented x4, follows commands, PERRLA, EOMI bilaterally   ASSESSMENT/PLAN:   No problem-specific Assessment & Plan notes found for this encounter.     Ronnald Ramp, MD [facility]

## 2021-06-03 ENCOUNTER — Ambulatory Visit: Payer: BC Managed Care – PPO | Admitting: Family Medicine

## 2021-07-19 ENCOUNTER — Observation Stay (HOSPITAL_BASED_OUTPATIENT_CLINIC_OR_DEPARTMENT_OTHER)
Admission: EM | Admit: 2021-07-19 | Discharge: 2021-07-21 | Disposition: A | Discharge status: Home / Self Care | Payer: BC Managed Care – PPO | Attending: Internal Medicine | Admitting: Internal Medicine

## 2021-07-19 ENCOUNTER — Encounter (HOSPITAL_BASED_OUTPATIENT_CLINIC_OR_DEPARTMENT_OTHER): Payer: Self-pay | Admitting: Emergency Medicine

## 2021-07-19 ENCOUNTER — Emergency Department (HOSPITAL_BASED_OUTPATIENT_CLINIC_OR_DEPARTMENT_OTHER): Payer: BC Managed Care – PPO

## 2021-07-19 ENCOUNTER — Observation Stay (HOSPITAL_COMMUNITY): Payer: BC Managed Care – PPO

## 2021-07-19 ENCOUNTER — Other Ambulatory Visit: Payer: Self-pay

## 2021-07-19 DIAGNOSIS — E119 Type 2 diabetes mellitus without complications: Secondary | ICD-10-CM | POA: Diagnosis not present

## 2021-07-19 DIAGNOSIS — K760 Fatty (change of) liver, not elsewhere classified: Secondary | ICD-10-CM | POA: Diagnosis not present

## 2021-07-19 DIAGNOSIS — M542 Cervicalgia: Secondary | ICD-10-CM | POA: Diagnosis not present

## 2021-07-19 DIAGNOSIS — Z79899 Other long term (current) drug therapy: Secondary | ICD-10-CM | POA: Diagnosis not present

## 2021-07-19 DIAGNOSIS — F17210 Nicotine dependence, cigarettes, uncomplicated: Secondary | ICD-10-CM | POA: Diagnosis not present

## 2021-07-19 DIAGNOSIS — R1011 Right upper quadrant pain: Secondary | ICD-10-CM

## 2021-07-19 DIAGNOSIS — S1980XA Other specified injuries of unspecified part of neck, initial encounter: Principal | ICD-10-CM

## 2021-07-19 DIAGNOSIS — Z20822 Contact with and (suspected) exposure to covid-19: Secondary | ICD-10-CM | POA: Insufficient documentation

## 2021-07-19 DIAGNOSIS — I10 Essential (primary) hypertension: Secondary | ICD-10-CM | POA: Diagnosis present

## 2021-07-19 DIAGNOSIS — R55 Syncope and collapse: Secondary | ICD-10-CM | POA: Diagnosis not present

## 2021-07-19 DIAGNOSIS — E876 Hypokalemia: Secondary | ICD-10-CM | POA: Diagnosis not present

## 2021-07-19 DIAGNOSIS — D649 Anemia, unspecified: Secondary | ICD-10-CM

## 2021-07-19 DIAGNOSIS — E538 Deficiency of other specified B group vitamins: Secondary | ICD-10-CM | POA: Diagnosis present

## 2021-07-19 DIAGNOSIS — R7303 Prediabetes: Secondary | ICD-10-CM | POA: Diagnosis present

## 2021-07-19 DIAGNOSIS — S199XXA Unspecified injury of neck, initial encounter: Secondary | ICD-10-CM | POA: Diagnosis not present

## 2021-07-19 DIAGNOSIS — R519 Headache, unspecified: Secondary | ICD-10-CM | POA: Diagnosis not present

## 2021-07-19 DIAGNOSIS — X58XXXA Exposure to other specified factors, initial encounter: Secondary | ICD-10-CM | POA: Insufficient documentation

## 2021-07-19 DIAGNOSIS — S069X9A Unspecified intracranial injury with loss of consciousness of unspecified duration, initial encounter: Secondary | ICD-10-CM | POA: Diagnosis not present

## 2021-07-19 DIAGNOSIS — R112 Nausea with vomiting, unspecified: Secondary | ICD-10-CM | POA: Diagnosis present

## 2021-07-19 LAB — HEPATIC FUNCTION PANEL
ALT: 23 U/L (ref 0–44)
AST: 46 U/L — ABNORMAL HIGH (ref 15–41)
Albumin: 3.4 g/dL — ABNORMAL LOW (ref 3.5–5.0)
Alkaline Phosphatase: 117 U/L (ref 38–126)
Bilirubin, Direct: 0.2 mg/dL (ref 0.0–0.2)
Indirect Bilirubin: 1 mg/dL — ABNORMAL HIGH (ref 0.3–0.9)
Total Bilirubin: 1.2 mg/dL (ref 0.3–1.2)
Total Protein: 6.4 g/dL — ABNORMAL LOW (ref 6.5–8.1)

## 2021-07-19 LAB — IRON AND TIBC
Iron: 253 ug/dL — ABNORMAL HIGH (ref 28–170)
Saturation Ratios: 83 % — ABNORMAL HIGH (ref 10.4–31.8)
TIBC: 304 ug/dL (ref 250–450)
UIBC: 51 ug/dL

## 2021-07-19 LAB — URINALYSIS, ROUTINE W REFLEX MICROSCOPIC
Bilirubin Urine: NEGATIVE
Glucose, UA: NEGATIVE mg/dL
Hgb urine dipstick: NEGATIVE
Ketones, ur: NEGATIVE mg/dL
Nitrite: NEGATIVE
Protein, ur: 30 mg/dL — AB
Specific Gravity, Urine: >1.046 — ABNORMAL HIGH (ref 1.005–1.030)
pH: 8 (ref 5.0–8.0)

## 2021-07-19 LAB — CBC WITH DIFFERENTIAL/PLATELET
Abs Immature Granulocytes: 0.11 10*3/uL — ABNORMAL HIGH (ref 0.00–0.07)
Basophils Absolute: 0 10*3/uL (ref 0.0–0.1)
Basophils Relative: 0 %
Eosinophils Absolute: 0.1 10*3/uL (ref 0.0–0.5)
Eosinophils Relative: 1 %
HCT: 19.6 % — ABNORMAL LOW (ref 36.0–46.0)
Hemoglobin: 6.9 g/dL — CL (ref 12.0–15.0)
Immature Granulocytes: 1 %
Lymphocytes Relative: 17 %
Lymphs Abs: 2 10*3/uL (ref 0.7–4.0)
MCH: 38.5 pg — ABNORMAL HIGH (ref 26.0–34.0)
MCHC: 35.2 g/dL (ref 30.0–36.0)
MCV: 109.5 fL — ABNORMAL HIGH (ref 80.0–100.0)
Monocytes Absolute: 0.2 10*3/uL (ref 0.1–1.0)
Monocytes Relative: 2 %
Neutro Abs: 9.7 10*3/uL — ABNORMAL HIGH (ref 1.7–7.7)
Neutrophils Relative %: 79 %
Platelets: 375 10*3/uL (ref 150–400)
RBC: 1.79 MIL/uL — ABNORMAL LOW (ref 3.87–5.11)
RDW: 15 % (ref 11.5–15.5)
WBC: 12.2 10*3/uL — ABNORMAL HIGH (ref 4.0–10.5)
nRBC: 0.2 % (ref 0.0–0.2)

## 2021-07-19 LAB — TSH: TSH: 1.616 u[IU]/mL (ref 0.350–4.500)

## 2021-07-19 LAB — TROPONIN I (HIGH SENSITIVITY): Troponin I (High Sensitivity): 8 ng/L (ref <18)

## 2021-07-19 LAB — RESP PANEL BY RT-PCR (FLU A&B, COVID) ARPGX2
Influenza A by PCR: NEGATIVE
Influenza B by PCR: NEGATIVE
SARS Coronavirus 2 by RT PCR: NEGATIVE

## 2021-07-19 LAB — OCCULT BLOOD X 1 CARD TO LAB, STOOL: Fecal Occult Bld: NEGATIVE

## 2021-07-19 LAB — HCG, SERUM, QUALITATIVE: Preg, Serum: NEGATIVE

## 2021-07-19 LAB — CBG MONITORING, ED: Glucose-Capillary: 166 mg/dL — ABNORMAL HIGH (ref 70–99)

## 2021-07-19 LAB — BASIC METABOLIC PANEL
Anion gap: 12 (ref 5–15)
BUN: 9 mg/dL (ref 6–20)
CO2: 27 mmol/L (ref 22–32)
Calcium: 7.3 mg/dL — ABNORMAL LOW (ref 8.9–10.3)
Chloride: 99 mmol/L (ref 98–111)
Creatinine, Ser: 0.34 mg/dL — ABNORMAL LOW (ref 0.44–1.00)
GFR, Estimated: >60 mL/min (ref >60)
Glucose, Bld: 173 mg/dL — ABNORMAL HIGH (ref 70–99)
Potassium: 2.8 mmol/L — ABNORMAL LOW (ref 3.5–5.1)
Sodium: 138 mmol/L (ref 135–145)

## 2021-07-19 LAB — RETICULOCYTES
Immature Retic Fract: 16.8 % — ABNORMAL HIGH (ref 2.3–15.9)
RBC.: 1.7 MIL/uL — ABNORMAL LOW (ref 3.87–5.11)
Retic Count, Absolute: 12.2 10*3/uL — ABNORMAL LOW (ref 19.0–186.0)
Retic Ct Pct: 0.7 % (ref 0.4–3.1)

## 2021-07-19 LAB — FOLATE: Folate: 2.1 ng/mL — ABNORMAL LOW (ref >5.9)

## 2021-07-19 LAB — PREPARE RBC (CROSSMATCH)

## 2021-07-19 LAB — ABO/RH: ABO/RH(D): B POS

## 2021-07-19 LAB — FERRITIN: Ferritin: 278 ng/mL (ref 11–307)

## 2021-07-19 LAB — VITAMIN B12: Vitamin B-12: 145 pg/mL — ABNORMAL LOW (ref 180–914)

## 2021-07-19 LAB — POTASSIUM: Potassium: 2.7 mmol/L — CL (ref 3.5–5.1)

## 2021-07-19 LAB — MAGNESIUM: Magnesium: 1 mg/dL — ABNORMAL LOW (ref 1.7–2.4)

## 2021-07-19 MED ORDER — THIAMINE HCL 100 MG/ML IJ SOLN: 100.0000 mg | Freq: Every day | INTRAMUSCULAR | Status: DC

## 2021-07-19 MED ORDER — POTASSIUM CHLORIDE 20 MEQ PO PACK
40.0000 meq | PACK | Freq: Once | ORAL | Status: AC
  Administered 2021-07-19: 40 meq via ORAL
  Filled 2021-07-19: qty 2

## 2021-07-19 MED ORDER — SODIUM CHLORIDE 0.9% FLUSH: 3.0000 mL | INTRAVENOUS | Status: DC | PRN

## 2021-07-19 MED ORDER — ACETAMINOPHEN 325 MG PO TABS
650.0000 mg | ORAL_TABLET | Freq: Four times a day (QID) | ORAL | Status: DC | PRN
  Administered 2021-07-19: 650 mg via ORAL
  Filled 2021-07-19: qty 2

## 2021-07-19 MED ORDER — SODIUM CHLORIDE 0.9 % IV SOLN
250.0000 mL | INTRAVENOUS | Status: DC | PRN
  Administered 2021-07-19: 250 mL via INTRAVENOUS

## 2021-07-19 MED ORDER — SODIUM CHLORIDE 0.9 % IV SOLN
INTRAVENOUS | Status: DC | PRN
  Administered 2021-07-19: 500 mL via INTRAVENOUS

## 2021-07-19 MED ORDER — CYANOCOBALAMIN 1000 MCG/ML IJ SOLN: 1000.0000 ug | Freq: Once | INTRAMUSCULAR | Status: DC

## 2021-07-19 MED ORDER — CYANOCOBALAMIN 1000 MCG/ML IJ SOLN
1000.0000 ug | Freq: Every day | INTRAMUSCULAR | Status: DC
  Administered 2021-07-20: 1000 ug via INTRAMUSCULAR
  Filled 2021-07-19 (×2): qty 1

## 2021-07-19 MED ORDER — MAGNESIUM SULFATE IN D5W 1-5 GM/100ML-% IV SOLN
1.0000 g | Freq: Once | INTRAVENOUS | Status: AC
  Administered 2021-07-19: 1 g via INTRAVENOUS
  Filled 2021-07-19: qty 100

## 2021-07-19 MED ORDER — THIAMINE HCL 100 MG PO TABS
100.0000 mg | ORAL_TABLET | Freq: Every day | ORAL | Status: DC
  Administered 2021-07-20 – 2021-07-21 (×2): 100 mg via ORAL
  Filled 2021-07-19 (×2): qty 1

## 2021-07-19 MED ORDER — ONDANSETRON HCL 4 MG PO TABS: 4.0000 mg | ORAL_TABLET | Freq: Four times a day (QID) | ORAL | Status: DC | PRN

## 2021-07-19 MED ORDER — FOLIC ACID 1 MG PO TABS
1.0000 mg | ORAL_TABLET | Freq: Every day | ORAL | Status: DC
  Administered 2021-07-20 – 2021-07-21 (×2): 1 mg via ORAL
  Filled 2021-07-19 (×2): qty 1

## 2021-07-19 MED ORDER — SODIUM CHLORIDE 0.9% IV SOLUTION: Freq: Once | INTRAVENOUS | Status: DC

## 2021-07-19 MED ORDER — ONDANSETRON HCL 4 MG/2ML IJ SOLN: 4.0000 mg | Freq: Four times a day (QID) | INTRAMUSCULAR | Status: DC | PRN

## 2021-07-19 MED ORDER — MAGNESIUM SULFATE 2 GM/50ML IV SOLN
2.0000 g | Freq: Once | INTRAVENOUS | Status: AC
  Administered 2021-07-19: 2 g via INTRAVENOUS
  Filled 2021-07-19: qty 50

## 2021-07-19 MED ORDER — AMLODIPINE BESYLATE 10 MG PO TABS
10.0000 mg | ORAL_TABLET | Freq: Every day | ORAL | Status: DC
  Administered 2021-07-19 – 2021-07-21 (×3): 10 mg via ORAL
  Filled 2021-07-19 (×3): qty 1

## 2021-07-19 MED ORDER — POTASSIUM CHLORIDE CRYS ER 20 MEQ PO TBCR
40.0000 meq | EXTENDED_RELEASE_TABLET | Freq: Once | ORAL | Status: AC
  Administered 2021-07-19: 40 meq via ORAL
  Filled 2021-07-19: qty 2

## 2021-07-19 MED ORDER — SODIUM CHLORIDE 0.9% FLUSH
3.0000 mL | Freq: Two times a day (BID) | INTRAVENOUS | Status: DC
  Administered 2021-07-20 (×2): 3 mL via INTRAVENOUS

## 2021-07-19 MED ORDER — ACETAMINOPHEN 650 MG RE SUPP: 650.0000 mg | Freq: Four times a day (QID) | RECTAL | Status: DC | PRN

## 2021-07-19 MED ORDER — PANTOPRAZOLE SODIUM 40 MG IV SOLR
40.0000 mg | INTRAVENOUS | Status: DC
  Administered 2021-07-19 – 2021-07-20 (×2): 40 mg via INTRAVENOUS
  Filled 2021-07-19 (×3): qty 40

## 2021-07-19 MED ORDER — IOHEXOL 350 MG/ML SOLN
75.0000 mL | Freq: Once | INTRAVENOUS | Status: AC | PRN
  Administered 2021-07-19: 75 mL via INTRAVENOUS

## 2021-07-19 MED ORDER — POTASSIUM CHLORIDE 10 MEQ/100ML IV SOLN
10.0000 meq | INTRAVENOUS | Status: AC
  Administered 2021-07-19 (×2): 10 meq via INTRAVENOUS
  Filled 2021-07-19 (×2): qty 100

## 2021-07-19 MED ORDER — LORAZEPAM 2 MG/ML IJ SOLN: 1.0000 mg | INTRAMUSCULAR | Status: DC | PRN

## 2021-07-19 MED ORDER — LORAZEPAM 1 MG PO TABS: 1.0000 mg | ORAL_TABLET | ORAL | Status: DC | PRN

## 2021-07-19 MED ORDER — ADULT MULTIVITAMIN W/MINERALS CH
1.0000 | ORAL_TABLET | Freq: Every day | ORAL | Status: DC
  Administered 2021-07-20 – 2021-07-21 (×2): 1 via ORAL
  Filled 2021-07-19 (×2): qty 1

## 2021-07-19 MED ORDER — CYCLOBENZAPRINE HCL 5 MG PO TABS
5.0000 mg | ORAL_TABLET | Freq: Two times a day (BID) | ORAL | Status: DC | PRN
  Administered 2021-07-19 – 2021-07-20 (×2): 5 mg via ORAL
  Filled 2021-07-19 (×2): qty 1

## 2021-07-19 NOTE — ED Provider Notes (Addendum)
[facility] EMERGENCY DEPT Provider Note   CSN: [id number] Arrival date & time: 07/19/21  0908     History Chief Complaint  Patient presents with   Loss of Consciousness    Norma Ramsey is a 35 y.o. female.  Patient is a 35 year old person presenting due to syncope with residual headache and bilateral leg weakness.  She states that around 6:30 PM she was sitting down watching a movie with her family and started to feel dizzy.  She described dizzy feeling as if her vision was fading.  She stood to go lay down and when she passed out she said it was "just like lights out". She woke up on the floor with her mom trying to wake her up.  She fell backwards and family told her that she hit the back of her head.  No loss of bladder or bowel.  Patient reports expressive aphasia for 5 to 10 minutes following episode.  She stayed with her mom last night and was going to go to work today, however she continued to feel dizzy and had difficulty with ambulation.      Loss of Consciousness Associated symptoms: dizziness, headaches and weakness       Past Medical History:  Diagnosis Date   Acute pain of left knee 06/03/2020   Hypertension    Obesity    Prediabetes    Tobacco abuse     Patient Active Problem List   Diagnosis Date Noted   Acute COVID-19 03/19/2021   Healthcare maintenance 06/25/2020   Screening examination for STD (sexually transmitted disease) 06/24/2020   Posterior left knee pain 06/24/2020   Well woman exam with routine gynecological exam 06/22/2020   Low back pain 03/23/2020   Morbid obesity (HCC) 12/02/2019   Prediabetes 12/02/2019   Essential hypertension 03/21/2017    Past Surgical History:  Procedure Laterality Date   WISDOM TOOTH EXTRACTION  09/2016     OB History   No obstetric history on file.     Family History  Problem Relation Age of Onset   Hypertension Mother    Hypertension Father    Diabetes Paternal Grandfather    AAA  (abdominal aortic aneurysm) Paternal Grandfather     Social History   Tobacco Use   Smoking status: Every Day    Packs/day: 0.30    Types: Cigarettes    Start date: 11/26/2011   Smokeless tobacco: Never  Substance Use Topics   Alcohol use: Yes    Alcohol/week: 1.0 standard drink    Types: 1 Glasses of wine per week    Comment: every day   Drug use: Yes    Types: Marijuana    Comment: every day    Home Medications Prior to Admission medications   Medication Sig Start Date End Date Taking? Authorizing Provider  amLODipine (NORVASC) 10 MG tablet Take 1 tablet (10 mg total) by mouth daily. Patient needs office visit before next refill. 03/02/21  Yes Westley Chandler, MD  cyclobenzaprine (FLEXERIL) 10 MG tablet Take 0.5 tablets (5 mg total) by mouth 2 (two) times daily as needed for muscle spasms. 02/22/21  Yes White, Elita Boone, NP  etonogestrel (NEXPLANON) 68 MG IMPL implant 1 each by Subdermal route once.    [provider]  naproxen (NAPROSYN) 500 MG tablet Take 1 tablet (500 mg total) by mouth 2 (two) times daily. 02/22/21   Valinda Hoar, NP    Allergies    Patient has no known allergies.  Review of Systems   Review of Systems  Cardiovascular:  Positive for syncope.  Musculoskeletal:  Positive for neck pain.  Neurological:  Positive for dizziness, weakness, numbness and headaches.   Physical Exam Updated Vital Signs BP (!) 138/95   Pulse 94   Temp 97.8 F (36.6 C) (Oral)   Resp 15   Ht 5\' 3"  (1.6 m)   Wt (!) 138.3 kg   SpO2 98%   BMI 54.03 kg/m   Physical Exam Constitutional:      General: She is not in acute distress.    Appearance: She is obese.  HENT:     Head: Normocephalic and atraumatic.     Mouth/Throat:     Mouth: Mucous membranes are moist.     Pharynx: Oropharynx is clear.  Cardiovascular:     Rate and Rhythm: Normal rate and regular rhythm.  Pulmonary:     Effort: Pulmonary effort is normal.     Breath sounds: Normal breath sounds.   Skin:    General: Skin is warm and dry.  Neurological:     Mental Status: She is alert and oriented to person, place, and time.     Sensory: Sensory deficit present.     Motor: Weakness present.     Coordination: Coordination abnormal.    ED Results / Procedures / Treatments   Labs (all labs ordered are listed, but only abnormal results are displayed) Labs Reviewed  CBC WITH DIFFERENTIAL/PLATELET - Abnormal; Notable for the following components:      Result Value   WBC 12.2 (*)    RBC 1.79 (*)    Hemoglobin 6.9 (*)    HCT 19.6 (*)    MCV 109.5 (*)    MCH 38.5 (*)    Neutro Abs 9.7 (*)    Abs Immature Granulocytes 0.11 (*)    All other components within normal limits  BASIC METABOLIC PANEL - Abnormal; Notable for the following components:   Potassium 2.8 (*)    Glucose, Bld 173 (*)    Creatinine, Ser 0.34 (*)    Calcium 7.3 (*)    All other components within normal limits  MAGNESIUM - Abnormal; Notable for the following components:   Magnesium 1.0 (*)    All other components within normal limits  RETICULOCYTES - Abnormal; Notable for the following components:   RBC. 1.70 (*)    Retic Count, Absolute 12.2 (*)    Immature Retic Fract 16.8 (*)    All other components within normal limits  CBG MONITORING, ED - Abnormal; Notable for the following components:   Glucose-Capillary 166 (*)    All other components within normal limits  RESP PANEL BY RT-PCR (FLU A&B, COVID) ARPGX2  HCG, SERUM, QUALITATIVE  URINALYSIS, ROUTINE W REFLEX MICROSCOPIC  VITAMIN B12  FOLATE  IRON AND TIBC  FERRITIN  OCCULT BLOOD X 1 CARD TO LAB, STOOL  TROPONIN I (HIGH SENSITIVITY)    EKG None  Radiology CT Angio Head W or Wo Contrast  Result Date: 07/19/2021 CLINICAL DATA:  Neuro deficit, acute, stroke suspected. Headache, left sided neck pain, leg weakness, concern for stroke, dissection. Loss of consciousness after standing up, hitting the head on the floor. EXAM: CT ANGIOGRAPHY HEAD AND  NECK TECHNIQUE: Multidetector CT imaging of the head and neck was performed using the standard protocol during bolus administration of intravenous contrast. Multiplanar CT image reconstructions and MIPs were obtained to evaluate the vascular anatomy. Carotid stenosis measurements (when applicable) are obtained utilizing NASCET criteria, using  the distal internal carotid diameter as the denominator. CONTRAST:  33mL OMNIPAQUE IOHEXOL 350 MG/ML SOLN COMPARISON:  None. FINDINGS: CT HEAD FINDINGS Brain: There is no evidence of an acute infarct, intracranial hemorrhage, mass, midline shift, or extra-axial fluid collection. The ventricles and sulci are normal. Vascular: No hyperdense vessel. Skull: No acute fracture or suspicious osseous lesion. Sinuses: Visualized paranasal sinuses and mastoid air cells are clear. Orbits: Unremarkable. Review of the MIP images confirms the above findings CTA NECK FINDINGS Aortic arch: Standard 3 vessel aortic arch with widely patent arch vessel origins. Right carotid system: Patent with a small amount of calcified and soft plaque at the carotid bifurcation. No evidence of a significant stenosis or dissection. Retropharyngeal course of the distal common and proximal internal carotid arteries. Left carotid system: Patent with a small amount of calcified and soft plaque at the carotid bifurcation. No evidence of a significant stenosis or dissection. Vertebral arteries: Patent without evidence of a significant stenosis, dissection, or significant atherosclerosis. Dominant left vertebral artery. Skeleton: No acute osseous abnormality or suspicious osseous lesion is identified. The cervical spine is reported separately. Other neck: No evidence of cervical lymphadenopathy or mass. Upper chest: Clear lung apices. Review of the MIP images confirms the above findings CTA HEAD FINDINGS Anterior circulation: The internal carotid arteries are patent from skull base to carotid termini with minimal  nonstenotic plaque bilaterally. ACAs and MCAs are patent without evidence of a proximal branch occlusion or significant proximal stenosis. No aneurysm is identified. Posterior circulation: The intracranial vertebral arteries are patent to the basilar. The basilar artery is widely patent. There are small posterior communicating arteries bilaterally. Both PCAs are patent without evidence of a significant proximal stenosis. No aneurysm is identified. Venous sinuses: Patent. Anatomic variants: None. Review of the MIP images confirms the above findings IMPRESSION: 1. Unremarkable CT appearance of the brain. No evidence of acute intracranial abnormality. 2. Minimal atherosclerosis in the head and neck without large vessel occlusion, significant stenosis, dissection, or aneurysm. Electronically Signed   By: Sebastian Ache M.D.   On: 07/19/2021 12:03   CT Angio Neck W and/or Wo Contrast  Result Date: 07/19/2021 CLINICAL DATA:  Neuro deficit, acute, stroke suspected. Headache, left sided neck pain, leg weakness, concern for stroke, dissection. Loss of consciousness after standing up, hitting the head on the floor. EXAM: CT ANGIOGRAPHY HEAD AND NECK TECHNIQUE: Multidetector CT imaging of the head and neck was performed using the standard protocol during bolus administration of intravenous contrast. Multiplanar CT image reconstructions and MIPs were obtained to evaluate the vascular anatomy. Carotid stenosis measurements (when applicable) are obtained utilizing NASCET criteria, using the distal internal carotid diameter as the denominator. CONTRAST:  23mL OMNIPAQUE IOHEXOL 350 MG/ML SOLN COMPARISON:  None. FINDINGS: CT HEAD FINDINGS Brain: There is no evidence of an acute infarct, intracranial hemorrhage, mass, midline shift, or extra-axial fluid collection. The ventricles and sulci are normal. Vascular: No hyperdense vessel. Skull: No acute fracture or suspicious osseous lesion. Sinuses: Visualized paranasal sinuses and  mastoid air cells are clear. Orbits: Unremarkable. Review of the MIP images confirms the above findings CTA NECK FINDINGS Aortic arch: Standard 3 vessel aortic arch with widely patent arch vessel origins. Right carotid system: Patent with a small amount of calcified and soft plaque at the carotid bifurcation. No evidence of a significant stenosis or dissection. Retropharyngeal course of the distal common and proximal internal carotid arteries. Left carotid system: Patent with a small amount of calcified and soft plaque at the carotid  bifurcation. No evidence of a significant stenosis or dissection. Vertebral arteries: Patent without evidence of a significant stenosis, dissection, or significant atherosclerosis. Dominant left vertebral artery. Skeleton: No acute osseous abnormality or suspicious osseous lesion is identified. The cervical spine is reported separately. Other neck: No evidence of cervical lymphadenopathy or mass. Upper chest: Clear lung apices. Review of the MIP images confirms the above findings CTA HEAD FINDINGS Anterior circulation: The internal carotid arteries are patent from skull base to carotid termini with minimal nonstenotic plaque bilaterally. ACAs and MCAs are patent without evidence of a proximal branch occlusion or significant proximal stenosis. No aneurysm is identified. Posterior circulation: The intracranial vertebral arteries are patent to the basilar. The basilar artery is widely patent. There are small posterior communicating arteries bilaterally. Both PCAs are patent without evidence of a significant proximal stenosis. No aneurysm is identified. Venous sinuses: Patent. Anatomic variants: None. Review of the MIP images confirms the above findings IMPRESSION: 1. Unremarkable CT appearance of the brain. No evidence of acute intracranial abnormality. 2. Minimal atherosclerosis in the head and neck without large vessel occlusion, significant stenosis, dissection, or aneurysm.  Electronically Signed   By: Sebastian Ache M.D.   On: 07/19/2021 12:03   CT C-SPINE NO CHARGE  Result Date: 07/19/2021 CLINICAL DATA:  Neck trauma. Loss of consciousness after standing up, hitting the head on the floor. EXAM: CT CERVICAL SPINE WITH CONTRAST TECHNIQUE: Multiplanar CT images of the cervical spine were reconstructed from contemporary CTA of the neck. CONTRAST:  No additional COMPARISON:  None FINDINGS: Alignment: Straightening of the normal cervical lordosis. No listhesis. Skull base and vertebrae: No acute fracture or suspicious osseous lesion. Soft tissues and spinal canal: No prevertebral fluid or swelling. No visible canal hematoma. Disc levels: Preserved disc space heights. Moderate right neural foraminal stenosis at C3-4 due to uncovertebral spurring. Upper chest: Clear lung apices. Other: None. IMPRESSION: No acute cervical spine fracture. Electronically Signed   By: Sebastian Ache M.D.   On: 07/19/2021 12:06   DG Chest Portable 1 View  Result Date: 07/19/2021 CLINICAL DATA:  Syncope. EXAM: PORTABLE CHEST 1 VIEW COMPARISON:  CT angiogram chest 03/18/2021. Chest radiographs 03/15/2021. FINDINGS: Shallow inspiration radiograph. Heart size within normal limits. No appreciable airspace consolidation. No evidence of pleural effusion or pneumothorax. No acute bony abnormality identified. IMPRESSION: Shallow inspiration radiograph. No evidence of acute cardiopulmonary abnormality. Electronically Signed   By: Jackey Loge D.O.   On: 07/19/2021 10:14    Procedures Procedures   Medications Ordered in ED Medications  potassium chloride 10 mEq in 100 mL IVPB (10 mEq Intravenous New Bag/Given 07/19/21 1217)  0.9 %  sodium chloride infusion (500 mLs Intravenous New Bag/Given 07/19/21 1214)  potassium chloride (KLOR-CON) packet 40 mEq (40 mEq Oral Given 07/19/21 1125)  magnesium sulfate IVPB 2 g 50 mL (0 g Intravenous Stopped 07/19/21 1214)  iohexol (OMNIPAQUE) 350 MG/ML injection 75 mL (75  mLs Intravenous Contrast Given 07/19/21 1110)    ED Course  I have reviewed the triage vital signs and the nursing notes.  Pertinent labs & imaging results that were available during my care of the patient were reviewed by me and considered in my medical decision making (see chart for details).    MDM Rules/Calculators/A&P                           Patient is a 35 year old person presenting due to syncope with residual headache and bilateral  leg weakness admitted for symptomatic anemia with hemoglobin of 6.9 with MCV of 109.5.  Hemoglobin in June 2022 was 13.7.  Patient denies seeing dark or bloody stools, hematemesis, or epistaxis.  Reticulocyte count showed an inappropriately low response.  Patient will require a blood transfusion as well as further work-up for etiology of macrocytic anemia.  Discussed with hospitalist and will transfer patient for higher level of care.  Due to fall secondary to syncope, CT head and neck, head and C-spine ordered.  They were negative.      Final Clinical Impression(s) / ED Diagnoses Final diagnoses:  Blunt trauma of neck    Rx / DC Orders ED Discharge Orders     None        Eason Housman, Florentina Addison, DO 07/19/21 1305    Kaimana Lurz, Florentina Addison, DO 07/19/21 1307    Delquan Poucher, DO 07/19/21 1318    Milagros Loll, MD 07/19/21 2055

## 2021-07-19 NOTE — Progress Notes (Signed)
Date and time results received: 07/19/21 2015 (use smartphrase ".now" to insert current time)  Test: K+ Critical Value: 2.7  Name of Provider Notified: Johann Capers NP  Orders Received? Or Actions Taken?: pending

## 2021-07-19 NOTE — ED Triage Notes (Signed)
Pt from home. States that she lost consciousness after standing up off the couch. Pt hit her head head on the back of the floor. Pt attempted to speak and could not speak for 5 to 10 minutes after the fall. Pt stated she was aware her mother was calling to her but she could not get the words out. Pt stated that she has not been able to bear weight on her legs to stand and walk.

## 2021-07-19 NOTE — ED Notes (Signed)
Handoff report given to [facility] [employer] on [employer]

## 2021-07-19 NOTE — Progress Notes (Signed)
Received a phone call from Facility: [facility]  Requesting MD: PA : Patient with h/o HTN, obesity, prediabetes,  presenting with syncope 07/18/21. She hit her head after fall, no shaking/bowel or bladder loss. This morning had headache and was weak in her legs so came to ED.  Found to have symptomatic anemia of 6.9. potassium of 2.8 and magnesium of 1.0.  Fecal occult negative.  Given 2 g of magnesium and 40 mEq of potassium. Vitals stable, on room air.   Plan of care: Admit to telemetry.  Needs blood transfusion and syncopal work-up.  Orthostatic vitals ordered.   The patient will be accepted for admission to telemetry at [facility] when bed is available.   Nursing staff, Please call the [facility] Admits & Consults System-Wide number at the top of Amion at the time of the patient's arrival so that the patient can be paged to the admitting physician.   Lanney Gins, M.D. [facility]

## 2021-07-19 NOTE — H&P (Addendum)
History and Physical    Norma Ramsey:[id number] DOB: 1986-02-28 DOA: 07/19/2021  PCP: Westley Chandler, MD Consultants:  none  Patient coming from:  [facility]- lives with her son   Chief Complaint: syncope   HPI: Norma Ramsey is a 35 y.o. female with medical history significant of HTN, prediabetes who presented to ED for syncope.  Last night she was dizzy and went to go lay down on the couch, but as she was walking her legs gave out and it was "light out." When she came to her mom was shaking her and she thought she was talking, but wasn't. She doesn't remember what happened. She was in and out while talking to her mom. Her mom told her she was out for a few minutes. She had no shaking, loss of bladder or bowel function or tongue biting. She stayed up and thought she was okay.  She slept for about an hour. When she got back up her head was hurting. She was able to doze off and on.  When she got up this AM she wasn't able to walk well, needed help walking as she was getting dizzy and felt like her legs were going to give out. She has also been dizzy with positional changes.   She has never been told she was anemic or received a blood transfusion. No hx of sickle cell in her family or bleeding disorders in her family. Her mom is anemic. She does not have periods since she had her nexplanon placed. She denies any blood in her stool or dark stool. No easy bruising or bleeding.   She has been tired and short of breath over the past few weeks, but thought due to her covid infection this summer.   No fever, chills, chest pain, palpitations, no cough, no abdominal pain. She has been nauseated every AM this past week and has had some emesis episodes. Has had some tingling. She has not traveled. Food can make nausea/vomiting worse.   Smokes at night, drinks "a couple of shots a night" never had withdrawals when she stopped drinking. Has been on NSAIDs for knee pain   ED Course: vitals:  afebrile, bp: 146/89, HR: 104, RR: 23, oxygen: 100% RA Pertinent labs: Hemoglobin 6.9, WBC 12.2, MCV 109, calcium 2.8, magnesium 1.0, vitamin B12 145,  folate 2.1, fecal occult negative In ED given magnesium sulfate IV, 40 mEq p.o. potassium, 20 mEq IV potassium.  CTH with no acute findings. CXR wnl.  CTA neck wnl. Ct spine wnl.  TR[facility] was asked to admit for syncope and symptomatic anemia.  Review of Systems: As per HPI; otherwise review of systems reviewed and negative.   Ambulatory Status:  Ambulates without assistance   Past Medical History:  Diagnosis Date   Acute pain of left knee 06/03/2020   Hypertension    Obesity    Prediabetes    Tobacco abuse     Past Surgical History:  Procedure Laterality Date   WISDOM TOOTH EXTRACTION  09/2016    Social History   Socioeconomic History   Marital status: Single    Spouse name: Not on file   Number of children: Not on file   Years of education: some technical college   Highest education level: Not on file  Occupational History   Occupation: customer service rep   Tobacco Use   Smoking status: Every Day    Packs/day: 0.30    Types: Cigarettes    Start date: 11/26/2011  Smokeless tobacco: Never  Substance and Sexual Activity   Alcohol use: Yes    Alcohol/week: 1.0 standard drink    Types: 1 Glasses of wine per week    Comment: every day   Drug use: Yes    Types: Marijuana    Comment: every day   Sexual activity: Never    Partners: Male    Birth control/protection: Implant  Other Topics Concern   Not on file  Social History Narrative   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    Social Determinants of Health   Financial Resource Strain: Not on file  Food Insecurity: Not on file  Transportation Needs: Not on file  Physical Activity: Not on file  Stress: Not on file  Social Connections: Not on file  Intimate Partner Violence: Not on file    No Known Allergies  Family History  Problem  Relation Age of Onset   Hypertension Mother    Hypertension Father    Diabetes Paternal Grandfather    AAA (abdominal aortic aneurysm) Paternal Grandfather     Prior to Admission medications   Medication Sig Start Date End Date Taking? Authorizing Provider  amLODipine (NORVASC) 10 MG tablet Take 1 tablet (10 mg total) by mouth daily. Patient needs office visit before next refill. 03/02/21  Yes Westley Chandler, MD  cyclobenzaprine (FLEXERIL) 10 MG tablet Take 0.5 tablets (5 mg total) by mouth 2 (two) times daily as needed for muscle spasms. 02/22/21  Yes White, Elita Boone, NP  etonogestrel (NEXPLANON) 68 MG IMPL implant 1 each by Subdermal route once.    [provider]  naproxen (NAPROSYN) 500 MG tablet Take 1 tablet (500 mg total) by mouth 2 (two) times daily. 02/22/21   Valinda Hoar, NP    Physical Exam: Vitals:   07/19/21 1215 07/19/21 1230 07/19/21 1300 07/19/21 1624  BP: 135/89 (!) 138/95 138/83 (!) 143/101  Pulse: 95 94 87 (!) 105  Resp: 17 15 15 20   Temp:    98.6 F (37 C)  TempSrc:    Oral  SpO2: 100% 98% 100% 100%  Weight:      Height:         General:  Appears calm and comfortable and is in NAD Eyes:  PERRL, EOMI, normal lids, iris ENT:  grossly normal hearing, lips & tongue, mmm; appropriate dentition Neck:  no LAD, masses or thyromegaly; no carotid bruits Cardiovascular:  RRR, no m/r/g. No LE edema.  Respiratory:   CTA bilaterally with no wheezes/rales/rhonchi.  Normal respiratory effort. Abdomen:  soft, TTP in epigastric and RUQ with equivocal murphy sign,  ND, NABS Back:   normal alignment, no CVAT Skin:  no rash or induration seen on limited exam, no bruising  Musculoskeletal:  grossly normal tone BUE/BLE, good ROM, no bony abnormality Lower extremity:  No LE edema.  Limited foot exam with no ulcerations.  2+ distal pulses. Psychiatric:  grossly normal mood and affect, speech fluent and appropriate, AOx3 Neurologic:  CN 2-12 grossly intact, moves  all extremities in coordinated fashion, sensation intact    Radiological Exams on Admission: Independently reviewed - see discussion in A/P where applicable  CT Angio Head W or Wo Contrast  Result Date: 07/19/2021 CLINICAL DATA:  Neuro deficit, acute, stroke suspected. Headache, left sided neck pain, leg weakness, concern for stroke, dissection. Loss of consciousness after standing up, hitting the head on the floor. EXAM: CT ANGIOGRAPHY HEAD AND NECK TECHNIQUE: Multidetector CT imaging of  the head and neck was performed using the standard protocol during bolus administration of intravenous contrast. Multiplanar CT image reconstructions and MIPs were obtained to evaluate the vascular anatomy. Carotid stenosis measurements (when applicable) are obtained utilizing NASCET criteria, using the distal internal carotid diameter as the denominator. CONTRAST:  45mL OMNIPAQUE IOHEXOL 350 MG/ML SOLN COMPARISON:  None. FINDINGS: CT HEAD FINDINGS Brain: There is no evidence of an acute infarct, intracranial hemorrhage, mass, midline shift, or extra-axial fluid collection. The ventricles and sulci are normal. Vascular: No hyperdense vessel. Skull: No acute fracture or suspicious osseous lesion. Sinuses: Visualized paranasal sinuses and mastoid air cells are clear. Orbits: Unremarkable. Review of the MIP images confirms the above findings CTA NECK FINDINGS Aortic arch: Standard 3 vessel aortic arch with widely patent arch vessel origins. Right carotid system: Patent with a small amount of calcified and soft plaque at the carotid bifurcation. No evidence of a significant stenosis or dissection. Retropharyngeal course of the distal common and proximal internal carotid arteries. Left carotid system: Patent with a small amount of calcified and soft plaque at the carotid bifurcation. No evidence of a significant stenosis or dissection. Vertebral arteries: Patent without evidence of a significant stenosis, dissection, or  significant atherosclerosis. Dominant left vertebral artery. Skeleton: No acute osseous abnormality or suspicious osseous lesion is identified. The cervical spine is reported separately. Other neck: No evidence of cervical lymphadenopathy or mass. Upper chest: Clear lung apices. Review of the MIP images confirms the above findings CTA HEAD FINDINGS Anterior circulation: The internal carotid arteries are patent from skull base to carotid termini with minimal nonstenotic plaque bilaterally. ACAs and MCAs are patent without evidence of a proximal branch occlusion or significant proximal stenosis. No aneurysm is identified. Posterior circulation: The intracranial vertebral arteries are patent to the basilar. The basilar artery is widely patent. There are small posterior communicating arteries bilaterally. Both PCAs are patent without evidence of a significant proximal stenosis. No aneurysm is identified. Venous sinuses: Patent. Anatomic variants: None. Review of the MIP images confirms the above findings IMPRESSION: 1. Unremarkable CT appearance of the brain. No evidence of acute intracranial abnormality. 2. Minimal atherosclerosis in the head and neck without large vessel occlusion, significant stenosis, dissection, or aneurysm. Electronically Signed   By: Sebastian Ache M.D.   On: 07/19/2021 12:03   CT Angio Neck W and/or Wo Contrast  Result Date: 07/19/2021 CLINICAL DATA:  Neuro deficit, acute, stroke suspected. Headache, left sided neck pain, leg weakness, concern for stroke, dissection. Loss of consciousness after standing up, hitting the head on the floor. EXAM: CT ANGIOGRAPHY HEAD AND NECK TECHNIQUE: Multidetector CT imaging of the head and neck was performed using the standard protocol during bolus administration of intravenous contrast. Multiplanar CT image reconstructions and MIPs were obtained to evaluate the vascular anatomy. Carotid stenosis measurements (when applicable) are obtained utilizing NASCET  criteria, using the distal internal carotid diameter as the denominator. CONTRAST:  63mL OMNIPAQUE IOHEXOL 350 MG/ML SOLN COMPARISON:  None. FINDINGS: CT HEAD FINDINGS Brain: There is no evidence of an acute infarct, intracranial hemorrhage, mass, midline shift, or extra-axial fluid collection. The ventricles and sulci are normal. Vascular: No hyperdense vessel. Skull: No acute fracture or suspicious osseous lesion. Sinuses: Visualized paranasal sinuses and mastoid air cells are clear. Orbits: Unremarkable. Review of the MIP images confirms the above findings CTA NECK FINDINGS Aortic arch: Standard 3 vessel aortic arch with widely patent arch vessel origins. Right carotid system: Patent with a small amount of calcified and soft  plaque at the carotid bifurcation. No evidence of a significant stenosis or dissection. Retropharyngeal course of the distal common and proximal internal carotid arteries. Left carotid system: Patent with a small amount of calcified and soft plaque at the carotid bifurcation. No evidence of a significant stenosis or dissection. Vertebral arteries: Patent without evidence of a significant stenosis, dissection, or significant atherosclerosis. Dominant left vertebral artery. Skeleton: No acute osseous abnormality or suspicious osseous lesion is identified. The cervical spine is reported separately. Other neck: No evidence of cervical lymphadenopathy or mass. Upper chest: Clear lung apices. Review of the MIP images confirms the above findings CTA HEAD FINDINGS Anterior circulation: The internal carotid arteries are patent from skull base to carotid termini with minimal nonstenotic plaque bilaterally. ACAs and MCAs are patent without evidence of a proximal branch occlusion or significant proximal stenosis. No aneurysm is identified. Posterior circulation: The intracranial vertebral arteries are patent to the basilar. The basilar artery is widely patent. There are small posterior communicating  arteries bilaterally. Both PCAs are patent without evidence of a significant proximal stenosis. No aneurysm is identified. Venous sinuses: Patent. Anatomic variants: None. Review of the MIP images confirms the above findings IMPRESSION: 1. Unremarkable CT appearance of the brain. No evidence of acute intracranial abnormality. 2. Minimal atherosclerosis in the head and neck without large vessel occlusion, significant stenosis, dissection, or aneurysm. Electronically Signed   By: Sebastian Ache M.D.   On: 07/19/2021 12:03   CT C-SPINE NO CHARGE  Result Date: 07/19/2021 CLINICAL DATA:  Neck trauma. Loss of consciousness after standing up, hitting the head on the floor. EXAM: CT CERVICAL SPINE WITH CONTRAST TECHNIQUE: Multiplanar CT images of the cervical spine were reconstructed from contemporary CTA of the neck. CONTRAST:  No additional COMPARISON:  None FINDINGS: Alignment: Straightening of the normal cervical lordosis. No listhesis. Skull base and vertebrae: No acute fracture or suspicious osseous lesion. Soft tissues and spinal canal: No prevertebral fluid or swelling. No visible canal hematoma. Disc levels: Preserved disc space heights. Moderate right neural foraminal stenosis at C3-4 due to uncovertebral spurring. Upper chest: Clear lung apices. Other: None. IMPRESSION: No acute cervical spine fracture. Electronically Signed   By: Sebastian Ache M.D.   On: 07/19/2021 12:06   DG Chest Portable 1 View  Result Date: 07/19/2021 CLINICAL DATA:  Syncope. EXAM: PORTABLE CHEST 1 VIEW COMPARISON:  CT angiogram chest 03/18/2021. Chest radiographs 03/15/2021. FINDINGS: Shallow inspiration radiograph. Heart size within normal limits. No appreciable airspace consolidation. No evidence of pleural effusion or pneumothorax. No acute bony abnormality identified. IMPRESSION: Shallow inspiration radiograph. No evidence of acute cardiopulmonary abnormality. Electronically Signed   By: Jackey Loge D.O.   On: 07/19/2021 10:14     EKG: Independently reviewed.  NSR with rate 94; nonspecific ST changes with no evidence of acute ischemia, borderline prolonged qt    Labs on Admission: I have personally reviewed the available labs and imaging studies at the time of the admission.  Pertinent labs:  Hemoglobin 6.9,  WBC 12.2,  MCV 109,  calcium 7.3  magnesium 1.0,  vitamin B12 145,   folate 2.1,  fecal occult negative    Assessment/Plan Principal Problem:   Symptomatic anemia -35 year old presenting with syncope, dizziness, SOB and fatigue and found to have a hgb of 6.9 -no known hx of sickle cell, blood disorders, heavy periods, blood in stool or other bleeding -does drink alcohol daily.  -B12 and folate low: replacing B12 via IM x 7 days, then weekly x  4 weeks then monthly or change to oral replacement -folic acid 1mg  daily -? If liver disease contributing, checking hepatic panel -interesting her iron and % Saturation levels are elevated. Also dose not have elevated retic count that would expect with anemia.  Many need worked up for other blood disorders. Ferritin normal  -does drink nightly/smoke and recent hx of NSAID use, although only for a week or so, could have gastric ulcer, but fecal occult negative. Starting PPI  -also checking tsh, RF and ANA, smear  -transfuse 1 unit PRBC, consented. Check cbc post transfusion and follow   Active Problems:   Syncope -most likely secondary to #1 -on telemetry -blood transfusion -orthostatics without orthostasis -echo     Nausea and vomiting -checking hepatic panel -RUQ for gallstones -anti emetics prn., has borderline prolonged qt, likely from electrolyte derangement.     RUQ pain -check hepatic panel -murphy sign mildly positive -RUQ [place] ordered  Prolonged QT Replace electrolytes Telemetry Avoid qt prolonging drugs     Essential hypertension Decently controlled, continue amlodipine and monitor    Prediabetes A1c 6.2 in 9/21, continue diet  and lifestyle modification -repeat a1c pending   Alcohol abuse Drinks a couple shots/night No history of withdrawals, but CIWA protocol ordered MV, thiamine and folate   Body mass index is 54.03 kg/m.   Level of care: Telemetry DVT prophylaxis:  SCDs Code Status:  Full - confirmed with patient Family Communication: None present  Disposition Plan:  The patient is from: home  Anticipated d/c is to: home  Requires inpatient hospitalization and is at significant risk of worsening, requires constant monitoring, blood transfusion, electrolyte replacement via IV and  frequent assessment   Patient is currently: stable  Consults called: none   Admission status:  observation   Dragon dictation used in completing this note.   10/21 MD [facility]   How to contact the [facility] Attending or Consulting provider 7A - 7P or covering provider during after hours 7P -7A, for this patient?  Check the care team in [facility] and look for a) attending/consulting [facility] provider listed and b) the [facility] team listed Log into [email or web address] and use [facility] universal password to access. If you do not have the password, please contact the hospital operator. Locate the [facility] provider you are looking for under [facility] and page to a number that you can be directly reached. If you still have difficulty reaching the provider, please page the [facility] (Director on Call) for the Hospitalists listed on amion for assistance.   07/19/2021, 5:56 PM

## 2021-07-19 NOTE — ED Notes (Signed)
Handoff report given to [facility] 

## 2021-07-19 NOTE — Progress Notes (Signed)
Admitting MD notified of pts arrival to [id number].

## 2021-07-19 NOTE — ED Notes (Signed)
Patient transported to X-ray 

## 2021-07-20 ENCOUNTER — Encounter (HOSPITAL_COMMUNITY): Payer: Self-pay | Admitting: Family Medicine

## 2021-07-20 ENCOUNTER — Observation Stay (HOSPITAL_BASED_OUTPATIENT_CLINIC_OR_DEPARTMENT_OTHER): Payer: BC Managed Care – PPO

## 2021-07-20 DIAGNOSIS — D599 Acquired hemolytic anemia, unspecified: Secondary | ICD-10-CM | POA: Diagnosis not present

## 2021-07-20 DIAGNOSIS — R55 Syncope and collapse: Secondary | ICD-10-CM

## 2021-07-20 DIAGNOSIS — D649 Anemia, unspecified: Secondary | ICD-10-CM

## 2021-07-20 LAB — BASIC METABOLIC PANEL
Anion gap: 10 (ref 5–15)
BUN: 7 mg/dL (ref 6–20)
CO2: 25 mmol/L (ref 22–32)
Calcium: 6.8 mg/dL — ABNORMAL LOW (ref 8.9–10.3)
Chloride: 101 mmol/L (ref 98–111)
Creatinine, Ser: <0.3 mg/dL — ABNORMAL LOW (ref 0.44–1.00)
Glucose, Bld: 137 mg/dL — ABNORMAL HIGH (ref 70–99)
Potassium: 3 mmol/L — ABNORMAL LOW (ref 3.5–5.1)
Sodium: 136 mmol/L (ref 135–145)

## 2021-07-20 LAB — ECHOCARDIOGRAM COMPLETE
AR max vel: 2.66 cm2
AV Area VTI: 2.62 cm2
AV Area mean vel: 2.5 cm2
AV Mean grad: 5 mmHg
AV Peak grad: 9.6 mmHg
Ao pk vel: 1.55 m/s
Area-P 1/2: 3.93 cm2
Height: 63 in
S' Lateral: 3 cm
Weight: 4880 oz

## 2021-07-20 LAB — CBC
HCT: 21.1 % — ABNORMAL LOW (ref 36.0–46.0)
HCT: 23.6 % — ABNORMAL LOW (ref 36.0–46.0)
Hemoglobin: 7.5 g/dL — ABNORMAL LOW (ref 12.0–15.0)
Hemoglobin: 8.3 g/dL — ABNORMAL LOW (ref 12.0–15.0)
MCH: 38.1 pg — ABNORMAL HIGH (ref 26.0–34.0)
MCH: 37.9 pg — ABNORMAL HIGH (ref 26.0–34.0)
MCHC: 35.5 g/dL (ref 30.0–36.0)
MCHC: 35.2 g/dL (ref 30.0–36.0)
MCV: 107.1 fL — ABNORMAL HIGH (ref 80.0–100.0)
MCV: 107.8 fL — ABNORMAL HIGH (ref 80.0–100.0)
Platelets: 329 10*3/uL (ref 150–400)
Platelets: 345 10*3/uL (ref 150–400)
RBC: 1.97 MIL/uL — ABNORMAL LOW (ref 3.87–5.11)
RBC: 2.19 MIL/uL — ABNORMAL LOW (ref 3.87–5.11)
RDW: 19.5 % — ABNORMAL HIGH (ref 11.5–15.5)
RDW: 19.6 % — ABNORMAL HIGH (ref 11.5–15.5)
WBC: 10 10*3/uL (ref 4.0–10.5)
WBC: 11.7 10*3/uL — ABNORMAL HIGH (ref 4.0–10.5)
nRBC: 0.2 % (ref 0.0–0.2)
nRBC: 0.2 % (ref 0.0–0.2)

## 2021-07-20 LAB — GLUCOSE, CAPILLARY
Glucose-Capillary: 111 mg/dL — ABNORMAL HIGH (ref 70–99)
Glucose-Capillary: 143 mg/dL — ABNORMAL HIGH (ref 70–99)

## 2021-07-20 LAB — DIRECT ANTIGLOBULIN TEST (NOT AT ARMC)
DAT, IgG: NEGATIVE
DAT, complement: NEGATIVE

## 2021-07-20 LAB — HEMOGLOBIN A1C
Hgb A1c MFr Bld: 7.8 % — ABNORMAL HIGH (ref 4.8–5.6)
Mean Plasma Glucose: 177.16 mg/dL

## 2021-07-20 LAB — SAVE SMEAR(SSMR), FOR PROVIDER SLIDE REVIEW

## 2021-07-20 LAB — HIV ANTIBODY (ROUTINE TESTING W REFLEX): HIV Screen 4th Generation wRfx: NONREACTIVE

## 2021-07-20 LAB — MAGNESIUM: Magnesium: 1.5 mg/dL — ABNORMAL LOW (ref 1.7–2.4)

## 2021-07-20 LAB — LACTATE DEHYDROGENASE: LDH: 1588 U/L — ABNORMAL HIGH (ref 98–192)

## 2021-07-20 MED ORDER — CALCIUM CARBONATE ANTACID 500 MG PO CHEW
1.0000 | CHEWABLE_TABLET | Freq: Three times a day (TID) | ORAL | Status: DC
  Administered 2021-07-20 – 2021-07-21 (×3): 200 mg via ORAL
  Filled 2021-07-20 (×3): qty 1

## 2021-07-20 MED ORDER — INSULIN ASPART 100 UNIT/ML IJ SOLN
0.0000 [IU] | Freq: Three times a day (TID) | INTRAMUSCULAR | Status: DC
Start: 2021-07-20 — End: 2021-07-21
  Administered 2021-07-21: 2 [IU] via SUBCUTANEOUS

## 2021-07-20 MED ORDER — MAGNESIUM SULFATE 2 GM/50ML IV SOLN
2.0000 g | Freq: Once | INTRAVENOUS | Status: AC
  Administered 2021-07-20: 2 g via INTRAVENOUS
  Filled 2021-07-20: qty 50

## 2021-07-20 MED ORDER — CALCIUM GLUCONATE-NACL 2-0.675 GM/100ML-% IV SOLN
2.0000 g | Freq: Once | INTRAVENOUS | Status: AC
  Administered 2021-07-20: 2000 mg via INTRAVENOUS
  Filled 2021-07-20: qty 100

## 2021-07-20 NOTE — Consult Note (Addendum)
[facility]  Telephone:([phone] Fax:([phone]    INITIAL HEMATOLOGY CONSULTATION  Referring MD: Dr. Jacquelin Hawking  Reason for Referral: Macrocytic anemia  HPI: Ms. Island is a 35 year old female with a past medical history significant for hypertension and prediabetes.  She presented to the emergency department with syncope.  Lab work on admission showed a WBC of 12.8, hemoglobin 6.9, MCV 109.5, ANC 9.7.  Vitamin B12 was low at 145, folate was low at 2.1, ferritin normal at 278.  Absolute reticulocyte count was low at 12.2.  She had normal T bili at 1.2 with a mildly elevated indirect bili at 1.0.  LDH elevated at 1588.  Haptoglobin pending.  Stool for occult blood negative.  She has received 1 unit PRBC so far this admission.  She has been started on vitamin B12 1000 mcg IM daily x7 days and folic acid 1 mg daily.  The patient reports that she is feeling better following blood transfusion.  She states that she has never had a history of anemia and has never required a blood transfusion in the past.  She further states that she has an implant and has a period about once every 3 years when it is time to replace her implant.  Prior to admission, she was having some dizziness and some shortness of breath with exertion.  She also reported some nausea and vomiting in the morning x2 weeks.  She has not had any recent fevers, chills, night sweats.  She denies chest pain.  She reports some ongoing discomfort where she hit her head when she fell during the syncopal episode.  Family history significant for her mother having anemia during pregnancy.  The patient is single and she has 1 son.  She states she did not have any anemia during her pregnancy.  She reports that she smokes about 1 cigarette/day and drinks 1-2 shots of liquor daily.  Hematology was asked see the patient make recommendations regarding her macrocytic anemia.  Past Medical History:  Diagnosis Date   Acute pain of  left knee 06/03/2020   Hypertension    Obesity    Prediabetes    Tobacco abuse   : .  G1, P1, amenorrheic-implant in place  Past Surgical History:  Procedure Laterality Date   WISDOM TOOTH EXTRACTION  09/2016  :   CURRENT MEDS: Current Facility-Administered Medications  Medication Dose Route Frequency Provider Last Rate Last Admin   0.9 %  sodium chloride infusion (Manually program via Guardrails IV Fluids)   Intravenous Once Orland Mustard, MD       0.9 %  sodium chloride infusion   Intravenous PRN Milagros Loll, MD 10 mL/hr at 07/19/21 1214 500 mL at 07/19/21 1214   0.9 %  sodium chloride infusion  250 mL Intravenous PRN Orland Mustard, MD 10 mL/hr at 07/19/21 2302 250 mL at 07/19/21 2302   acetaminophen (TYLENOL) tablet 650 mg  650 mg Oral Q6H PRN Orland Mustard, MD   650 mg at 07/19/21 2246   Or   acetaminophen (TYLENOL) suppository 650 mg  650 mg Rectal Q6H PRN Orland Mustard, MD       amLODipine (NORVASC) tablet 10 mg  10 mg Oral Daily Orland Mustard, MD   10 mg at 07/20/21 0858   calcium carbonate (TUMS - dosed in mg elemental calcium) chewable tablet 200 mg of elemental calcium  1 tablet Oral TID Narda Bonds, MD       calcium gluconate 2 g/ 100  mL sodium chloride IVPB  2 g Intravenous Once Narda Bonds, MD       cyanocobalamin ((VITAMIN B-12)) injection 1,000 mcg  1,000 mcg Intramuscular Daily Orland Mustard, MD   1,000 mcg at 07/20/21 0859   cyclobenzaprine (FLEXERIL) tablet 5 mg  5 mg Oral BID PRN Orland Mustard, MD   5 mg at 07/19/21 2246   folic acid (FOLVITE) tablet 1 mg  1 mg Oral Daily Orland Mustard, MD   1 mg at 07/20/21 0858   insulin aspart (novoLOG) injection 0-15 Units  0-15 Units Subcutaneous TID WC Narda Bonds, MD       LORazepam (ATIVAN) tablet 1-4 mg  1-4 mg Oral Q1H PRN Orland Mustard, MD       Or   LORazepam (ATIVAN) injection 1-4 mg  1-4 mg Intravenous Q1H PRN Orland Mustard, MD       multivitamin with minerals tablet 1 tablet  1 tablet Oral  Daily Orland Mustard, MD   1 tablet at 07/20/21 0858   pantoprazole (PROTONIX) injection 40 mg  40 mg Intravenous Q24H Orland Mustard, MD   40 mg at 07/19/21 2237   sodium chloride flush (NS) 0.9 % injection 3 mL  3 mL Intravenous Q12H Orland Mustard, MD   3 mL at 07/20/21 0859   sodium chloride flush (NS) 0.9 % injection 3 mL  3 mL Intravenous PRN Orland Mustard, MD       thiamine tablet 100 mg  100 mg Oral Daily Orland Mustard, MD   100 mg at 07/20/21 9562   Or   thiamine (B-1) injection 100 mg  100 mg Intravenous Daily Orland Mustard, MD          No Known Allergies:   Family History  Problem Relation Age of Onset   Hypertension Mother    Hypertension Father    Diabetes Paternal Grandfather    AAA (abdominal aortic aneurysm) Paternal Grandfather   :   Social History   Socioeconomic History   Marital status: Single    Spouse name: Not on file   Number of children: Not on file   Years of education: some technical college   Highest education level: Not on file  Occupational History   Occupation: customer service rep   Tobacco Use   Smoking status: Every Day    Packs/day: 0.30    Types: Cigarettes    Start date: 11/26/2011   Smokeless tobacco: Never  Substance and Sexual Activity   Alcohol use: Yes    Alcohol/week: 1.0 standard drink    Types: 1 Glasses of wine per week    Comment: every day   Drug use: Yes    Types: Marijuana    Comment: every day   Sexual activity: Never    Partners: Male    Birth control/protection: Implant  Other Topics Concern   Not on file  Social History Narrative   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    Social Determinants of Health   Financial Resource Strain: Not on file  Food Insecurity: Not on file  Transportation Needs: Not on file  Physical Activity: Not on file  Stress: Not on file  Social Connections: Not on file  Intimate Partner Violence: Not on file  :  REVIEW OF SYSTEMS:  A comprehensive 14  point review of systems was negative except as noted in the HPI.    Exam: Patient Vitals for the past 24 hrs:  BP Temp Temp src  Pulse Resp SpO2  07/20/21 1244 137/87 98.7 F (37.1 C) Oral 99 20 100 %  07/20/21 0445 (!) 145/100 98.9 F (37.2 C) Oral -- 20 96 %  07/20/21 0339 (!) 151/97 97.9 F (36.6 C) Axillary 96 20 96 %  07/20/21 0215 (!) 149/99 99.1 F (37.3 C) Oral 100 18 95 %  07/20/21 0138 (!) 130/91 99.2 F (37.3 C) Oral 99 18 93 %  07/19/21 2309 140/87 98.4 F (36.9 C) Oral 100 20 97 %  07/19/21 2100 131/83 98.9 F (37.2 C) Oral 99 20 100 %  07/19/21 1624 (!) 143/101 98.6 F (37 C) Oral (!) 105 20 100 %    General:  well-nourished in no acute distress.   Eyes:  no scleral icterus.   ENT:  There were no oropharyngeal lesions.   Lymphatics:  Negative cervical, supraclavicular, axillary, inguinal adenopathy.   Respiratory: lungs were clear bilaterally without wheezing or crackles.   Cardiovascular:  Regular rate and rhythm, S1/S2, without murmur, rub or gallop.  There was no pedal edema.   GI:  abdomen was soft, flat, nontender, nondistended, without organomegaly.   Musculoskeletal: Strength symmetrical in the upper and lower extremities. Skin exam was without ecchymosis, petechiae.   Neuro exam was nonfocal.  Patient was alert and oriented.  Attention was good.   Language was appropriate.  Mood was normal without depression.  Speech was not pressured.  Thought content was not tangential.  The motor exam appears intact in the upper and lower extremities  LABS:  Lab Results  Component Value Date   WBC 10.0 07/20/2021   HGB 7.5 (L) 07/20/2021   HCT 21.1 (L) 07/20/2021   PLT 329 07/20/2021   GLUCOSE 137 (H) 07/20/2021   CHOL 263 (H) 12/20/2019   TRIG 331 (H) 12/20/2019   HDL 58 12/20/2019   LDLCALC 145 (H) 12/20/2019   ALT 23 07/19/2021   AST 46 (H) 07/19/2021   NA 136 07/20/2021   K 3.0 (L) 07/20/2021   CL 101 07/20/2021   CREATININE <0.30 (L) 07/20/2021   BUN 7  07/20/2021   CO2 25 07/20/2021   HGBA1C 7.8 (H) 07/19/2021   Blood smear 07/20/2021-the platelets appear normal in number, no significant platelet clumps.  There are hypersegmented neutrophils.  No blasts or other young forms are seen.  Normal appearing lymphocytes.  Marked variation in red cell size with numerous macrocytes.  Few teardrops, ovalocytes, and microcytes.  Mild increase in polychromasia.  No nucleated red cells. CT Angio Head W or Wo Contrast  Result Date: 07/19/2021 CLINICAL DATA:  Neuro deficit, acute, stroke suspected. Headache, left sided neck pain, leg weakness, concern for stroke, dissection. Loss of consciousness after standing up, hitting the head on the floor. EXAM: CT ANGIOGRAPHY HEAD AND NECK TECHNIQUE: Multidetector CT imaging of the head and neck was performed using the standard protocol during bolus administration of intravenous contrast. Multiplanar CT image reconstructions and MIPs were obtained to evaluate the vascular anatomy. Carotid stenosis measurements (when applicable) are obtained utilizing NASCET criteria, using the distal internal carotid diameter as the denominator. CONTRAST:  75mL OMNIPAQUE IOHEXOL 350 MG/ML SOLN COMPARISON:  None. FINDINGS: CT HEAD FINDINGS Brain: There is no evidence of an acute infarct, intracranial hemorrhage, mass, midline shift, or extra-axial fluid collection. The ventricles and sulci are normal. Vascular: No hyperdense vessel. Skull: No acute fracture or suspicious osseous lesion. Sinuses: Visualized paranasal sinuses and mastoid air cells are clear. Orbits: Unremarkable. Review of the MIP images confirms the above findings  CTA NECK FINDINGS Aortic arch: Standard 3 vessel aortic arch with widely patent arch vessel origins. Right carotid system: Patent with a small amount of calcified and soft plaque at the carotid bifurcation. No evidence of a significant stenosis or dissection. Retropharyngeal course of the distal common and proximal  internal carotid arteries. Left carotid system: Patent with a small amount of calcified and soft plaque at the carotid bifurcation. No evidence of a significant stenosis or dissection. Vertebral arteries: Patent without evidence of a significant stenosis, dissection, or significant atherosclerosis. Dominant left vertebral artery. Skeleton: No acute osseous abnormality or suspicious osseous lesion is identified. The cervical spine is reported separately. Other neck: No evidence of cervical lymphadenopathy or mass. Upper chest: Clear lung apices. Review of the MIP images confirms the above findings CTA HEAD FINDINGS Anterior circulation: The internal carotid arteries are patent from skull base to carotid termini with minimal nonstenotic plaque bilaterally. ACAs and MCAs are patent without evidence of a proximal branch occlusion or significant proximal stenosis. No aneurysm is identified. Posterior circulation: The intracranial vertebral arteries are patent to the basilar. The basilar artery is widely patent. There are small posterior communicating arteries bilaterally. Both PCAs are patent without evidence of a significant proximal stenosis. No aneurysm is identified. Venous sinuses: Patent. Anatomic variants: None. Review of the MIP images confirms the above findings IMPRESSION: 1. Unremarkable CT appearance of the brain. No evidence of acute intracranial abnormality. 2. Minimal atherosclerosis in the head and neck without large vessel occlusion, significant stenosis, dissection, or aneurysm. Electronically Signed   By: Sebastian Ache M.D.   On: 07/19/2021 12:03   CT Angio Neck W and/or Wo Contrast  Result Date: 07/19/2021 CLINICAL DATA:  Neuro deficit, acute, stroke suspected. Headache, left sided neck pain, leg weakness, concern for stroke, dissection. Loss of consciousness after standing up, hitting the head on the floor. EXAM: CT ANGIOGRAPHY HEAD AND NECK TECHNIQUE: Multidetector CT imaging of the head and  neck was performed using the standard protocol during bolus administration of intravenous contrast. Multiplanar CT image reconstructions and MIPs were obtained to evaluate the vascular anatomy. Carotid stenosis measurements (when applicable) are obtained utilizing NASCET criteria, using the distal internal carotid diameter as the denominator. CONTRAST:  62mL OMNIPAQUE IOHEXOL 350 MG/ML SOLN COMPARISON:  None. FINDINGS: CT HEAD FINDINGS Brain: There is no evidence of an acute infarct, intracranial hemorrhage, mass, midline shift, or extra-axial fluid collection. The ventricles and sulci are normal. Vascular: No hyperdense vessel. Skull: No acute fracture or suspicious osseous lesion. Sinuses: Visualized paranasal sinuses and mastoid air cells are clear. Orbits: Unremarkable. Review of the MIP images confirms the above findings CTA NECK FINDINGS Aortic arch: Standard 3 vessel aortic arch with widely patent arch vessel origins. Right carotid system: Patent with a small amount of calcified and soft plaque at the carotid bifurcation. No evidence of a significant stenosis or dissection. Retropharyngeal course of the distal common and proximal internal carotid arteries. Left carotid system: Patent with a small amount of calcified and soft plaque at the carotid bifurcation. No evidence of a significant stenosis or dissection. Vertebral arteries: Patent without evidence of a significant stenosis, dissection, or significant atherosclerosis. Dominant left vertebral artery. Skeleton: No acute osseous abnormality or suspicious osseous lesion is identified. The cervical spine is reported separately. Other neck: No evidence of cervical lymphadenopathy or mass. Upper chest: Clear lung apices. Review of the MIP images confirms the above findings CTA HEAD FINDINGS Anterior circulation: The internal carotid arteries are patent from skull base  to carotid termini with minimal nonstenotic plaque bilaterally. ACAs and MCAs are patent  without evidence of a proximal branch occlusion or significant proximal stenosis. No aneurysm is identified. Posterior circulation: The intracranial vertebral arteries are patent to the basilar. The basilar artery is widely patent. There are small posterior communicating arteries bilaterally. Both PCAs are patent without evidence of a significant proximal stenosis. No aneurysm is identified. Venous sinuses: Patent. Anatomic variants: None. Review of the MIP images confirms the above findings IMPRESSION: 1. Unremarkable CT appearance of the brain. No evidence of acute intracranial abnormality. 2. Minimal atherosclerosis in the head and neck without large vessel occlusion, significant stenosis, dissection, or aneurysm. Electronically Signed   By: Sebastian Ache M.D.   On: 07/19/2021 12:03   CT C-SPINE NO CHARGE  Result Date: 07/19/2021 CLINICAL DATA:  Neck trauma. Loss of consciousness after standing up, hitting the head on the floor. EXAM: CT CERVICAL SPINE WITH CONTRAST TECHNIQUE: Multiplanar CT images of the cervical spine were reconstructed from contemporary CTA of the neck. CONTRAST:  No additional COMPARISON:  None FINDINGS: Alignment: Straightening of the normal cervical lordosis. No listhesis. Skull base and vertebrae: No acute fracture or suspicious osseous lesion. Soft tissues and spinal canal: No prevertebral fluid or swelling. No visible canal hematoma. Disc levels: Preserved disc space heights. Moderate right neural foraminal stenosis at C3-4 due to uncovertebral spurring. Upper chest: Clear lung apices. Other: None. IMPRESSION: No acute cervical spine fracture. Electronically Signed   By: Sebastian Ache M.D.   On: 07/19/2021 12:06   DG Chest Portable 1 View  Result Date: 07/19/2021 CLINICAL DATA:  Syncope. EXAM: PORTABLE CHEST 1 VIEW COMPARISON:  CT angiogram chest 03/18/2021. Chest radiographs 03/15/2021. FINDINGS: Shallow inspiration radiograph. Heart size within normal limits. No appreciable  airspace consolidation. No evidence of pleural effusion or pneumothorax. No acute bony abnormality identified. IMPRESSION: Shallow inspiration radiograph. No evidence of acute cardiopulmonary abnormality. Electronically Signed   By: Jackey Loge D.O.   On: 07/19/2021 10:14   ECHOCARDIOGRAM COMPLETE  Result Date: 07/20/2021    ECHOCARDIOGRAM REPORT   Patient Name:   MEYA CLUTTER Date of Exam: 07/20/2021 Medical Rec #:  [id number]       Height:       63.0 in Accession #:    [id number]      Weight:       305.0 lb Date of Birth:  [birth year]      BSA:          2.313 m Patient Age:    35 years        BP:           131/83 mmHg Patient Gender: F               HR:           89 bpm. Exam Location:  Inpatient Procedure: 2D Echo, Color Doppler and Cardiac Doppler Indications:    Syncope  History:        Patient has no prior history of Echocardiogram examinations.                 Signs/Symptoms:Syncope.  Sonographer:    Roosvelt Maser RDCS Referring Phys: [id number] ALLISON WOLFE IMPRESSIONS  1. Left ventricular ejection fraction, by estimation, is 65 to 70%. The left ventricle has hyperdynamic function. The left ventricle has no regional wall motion abnormalities. There is mild left ventricular hypertrophy. Left ventricular diastolic parameters were normal.  2. Right ventricular systolic function is normal. The  right ventricular size is normal. Tricuspid regurgitation signal is inadequate for assessing PA pressure.  3. The mitral valve is normal in structure. No evidence of mitral valve regurgitation. No evidence of mitral stenosis.  4. The aortic valve is tricuspid. Aortic valve regurgitation is not visualized. No aortic stenosis is present.  5. The inferior vena cava is normal in size with greater than 50% respiratory variability, suggesting right atrial pressure of 3 mmHg. FINDINGS  Left Ventricle: Left ventricular ejection fraction, by estimation, is 65 to 70%. The left ventricle has hyperdynamic function. The left  ventricle has no regional wall motion abnormalities. The left ventricular internal cavity size was normal in size. There is mild left ventricular hypertrophy. Left ventricular diastolic parameters were normal. Right Ventricle: The right ventricular size is normal. No increase in right ventricular wall thickness. Right ventricular systolic function is normal. Tricuspid regurgitation signal is inadequate for assessing PA pressure. Left Atrium: Left atrial size was normal in size. Right Atrium: Right atrial size was normal in size. Pericardium: There is no evidence of pericardial effusion. Mitral Valve: The mitral valve is normal in structure. No evidence of mitral valve regurgitation. No evidence of mitral valve stenosis. Tricuspid Valve: The tricuspid valve is normal in structure. Tricuspid valve regurgitation is not demonstrated. Aortic Valve: The aortic valve is tricuspid. Aortic valve regurgitation is not visualized. No aortic stenosis is present. Aortic valve mean gradient measures 5.0 mmHg. Aortic valve peak gradient measures 9.6 mmHg. Aortic valve area, by VTI measures 2.62 cm. Pulmonic Valve: The pulmonic valve was normal in structure. Pulmonic valve regurgitation is not visualized. Aorta: The aortic root is normal in size and structure. Venous: The inferior vena cava is normal in size with greater than 50% respiratory variability, suggesting right atrial pressure of 3 mmHg. IAS/Shunts: No atrial level shunt detected by color flow Doppler.  LEFT VENTRICLE PLAX 2D LVIDd:         4.20 cm   Diastology LVIDs:         3.00 cm   LV e' medial:    8.81 cm/s LV PW:         1.30 cm   LV E/e' medial:  12.9 LV IVS:        1.40 cm   LV e' lateral:   9.79 cm/s LVOT diam:     2.10 cm   LV E/e' lateral: 11.6 LV SV:         71 LV SV Index:   31 LVOT Area:     3.46 cm  LEFT ATRIUM             Index LA diam:        4.10 cm 1.77 cm/m LA Vol (A2C):   63.1 ml 27.27 ml/m LA Vol (A4C):   48.5 ml 20.96 ml/m LA Biplane Vol: 56.3  ml 24.34 ml/m  AORTIC VALVE AV Area (Vmax):    2.66 cm AV Area (Vmean):   2.50 cm AV Area (VTI):     2.62 cm AV Vmax:           155.00 cm/s AV Vmean:          107.000 cm/s AV VTI:            0.271 m AV Peak Grad:      9.6 mmHg AV Mean Grad:      5.0 mmHg LVOT Vmax:         119.00 cm/s LVOT Vmean:        77.100  cm/s LVOT VTI:          0.205 m LVOT/AV VTI ratio: 0.76  AORTA Ao Root diam: 3.10 cm Ao Asc diam:  2.80 cm MITRAL VALVE MV Area (PHT): 3.93 cm     SHUNTS MV Decel Time: 193 msec     Systemic VTI:  0.20 m MV E velocity: 114.00 cm/s  Systemic Diam: 2.10 cm MV A velocity: 96.40 cm/s MV E/A ratio:  1.18 Dalton McleanMD Electronically signed by Wilfred Lacy Signature Date/Time: 07/20/2021/11:32:53 AM    Final    US Abdomen Limited RUQ (LIVER/GB)  Result Date: 07/19/2021 CLINICAL DATA:  History of recent falls with right upper quadrant pain for 2 days EXAM: ULTRASOUND ABDOMEN LIMITED RIGHT UPPER QUADRANT COMPARISON:  None. FINDINGS: Gallbladder: No gallstones or wall thickening visualized. No sonographic Murphy sign noted by sonographer. Common bile duct: Diameter: 3.3 mm. Liver: Diffusely increased in echogenicity consistent with mild fatty infiltration. No focal mass is noted. Portal vein is patent on color Doppler imaging with normal direction of blood flow towards the liver. Other: None. IMPRESSION: Fatty liver. No other focal abnormality is noted. Electronically Signed   By: Alcide Clever M.D.   On: 07/19/2021 22:52     ASSESSMENT AND PLAN:  1.  Macrocytic anemia 2.  Vitamin B12 deficiency 3.  Folate deficiency 4.  Mildly elevated indirect bilirubin 5.  Syncope 6.  Hypokalemia 7.  Hypocalcemia 8.  Hypomagnesemia 9.  Hypertension 10.  Diabetes mellitus 11.  Alcohol use  Ms. Deming was admitted following a syncopal episode and was found to have severe, symptomatic macrocytic anemia.  As part of her work-up, she was found to have vitamin B12 and folate deficiency and was started on  replacement.  She does not have any evidence of iron deficiency.  Additional work-up showed a normal T bili with a mildly elevated indirect bili.  Reticulocytes were low but LDH was significantly elevated.  These results support a diagnosis of vitamin B12 deficiency.  However, will review peripheral blood smear.  We will also check folate RBC.  Recommendations: 1.  We will review peripheral blood smear. 2.  Continue vitamin B12 and folic acid replacement. 3.  We will follow-up on haptoglobin, DAT, and folate RBC. 4.  Transfuse for symptomatic anemia. 5.  Electrolyte replacement per hospitalist. 6.  Discontinue alcohol use 7.  Outpatient follow-up to continue vitamin B12 replacement and repeat CBC per primary provider  Thank you for this referral.  Clenton Pare, DNP, AGPCNP-BC, AOCNP  Ms. Harkey was interviewed and examined.  I reviewed the peripheral blood smear.  She is admitted with severe macrocytic anemia.  I suspect the anemia is related to vitamin B12 deficiency.  The peripheral blood smear, elevated LDH, and mild elevation of bilirubin are consistent with vitamin B12 deficiency.  There could also be a component of bone marrow toxicity from alcohol use and folate deficiency.  The CBC can be followed with repletion of vitamin B12 and folate  We will initiate additional diagnostic evaluation if the anemia does not resolve.  I was present for greater than 50% of today's visit.  I performed medical stage making.

## 2021-07-20 NOTE — Progress Notes (Signed)
  Echocardiogram 2D Echocardiogram has been performed.  Norma Ramsey F 07/20/2021, 10:47 AM

## 2021-07-20 NOTE — Plan of Care (Signed)
  Problem: Education: Goal: Knowledge of General Education information will improve Description: Including pain rating scale, medication(s)/side effects and non-pharmacologic comfort measures Outcome: Progressing   Problem: Health Behavior/Discharge Planning: Goal: Ability to manage health-related needs will improve Outcome: Progressing   Problem: Clinical Measurements: Goal: Will remain free from infection Outcome: Progressing Goal: Diagnostic test results will improve Outcome: Progressing Goal: Cardiovascular complication will be avoided Outcome: Progressing   Problem: Nutrition: Goal: Adequate nutrition will be maintained Outcome: Progressing   Problem: Pain Managment: Goal: General experience of comfort will improve Outcome: Progressing   Problem: Safety: Goal: Ability to remain free from injury will improve Outcome: Progressing   Problem: Skin Integrity: Goal: Risk for impaired skin integrity will decrease Outcome: Progressing

## 2021-07-20 NOTE — Progress Notes (Addendum)
PROGRESS NOTE    Norma Ramsey  BRA:[id number] DOB: [birth year] DOA: 07/19/2021 PCP: Westley Chandler, MD   Brief Narrative: Norma Ramsey is a 35 y.o. female with a history of diabetes. Patient presented secondary to syncopal event and found to have anemia. 1 unit of PRBC transfused. Labs now seem to be consistent with hemolytic anemia.   Assessment & Plan:   Principal Problem:   Symptomatic anemia Active Problems:   Essential hypertension   Nausea and vomiting   Prediabetes   Syncope   RUQ pain   B12 deficiency   Symptomatic anemia Patient with associated syncope. Hemoglobin of 6.9 on admission requiring transfusion of 1 unit of PRBC. Previous baseline of 13.7 in June of 2022.  Possible hemolytic anemia Macrocytic anemia Anemia with associated low reticulocyte count on admission. Minimally elevated indirect bilirubin but LDH obtained and is significantly elevated at over 1500. Symptoms present over the last few weeks. -Hematology consulted and will see today; recommending DAT -Haptoglobin pending -Pathologist smear review pending  Syncope Likely secondary to anemia. Transthoracic Echocardiogram without AV dysfunction and with normal LV function  Nausea/vomiting Unsure of etiology. Seems resolved.  RUQ pain Unknown etiology. Improved. RUQ ultrasound obtained on admission and significant for fatty liver disease.  Vitamin B12 deficiency Folate deficiency -Continue vitamin B12 and folic acid supplementation  Hypocalcemia Unknown etiology -Calcium gluconate 2 g IV x1 -Tums TID  Prolonged QTc -Continue telemetry  Primary hypertension Patient is on amlodipine as an outpatient -Continue amlodipine  Diabetes mellitus, type 2 Previously in pre-diabetes range. Hemoglobin A1C of 7.8% this admission. Not on medication management. 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 -Start SSI -Would likely benefit from metformin and diet modification on  discharge -Dietitian consult  Alcohol use Drinks a couple of shots per night per history. CIWA ordered on admission. No history of alcohol withdrawal  Morbid obesity Body mass index is 54.03 kg/m.   DVT prophylaxis: SCDs Code Status:   Code Status: Full Code Family Communication: None at bedside Disposition Plan: Discharge likely not for 2-3+ days pending workup/management of hemolytic anemia   Consultants:  Hematology  Procedures:  TRANSTHORACIC ECHOCARDIOGRAM (07/20/2021) IMPRESSIONS     1. Left ventricular ejection fraction, by estimation, is 65 to 70%. The  left ventricle has hyperdynamic function. The left ventricle has no  regional wall motion abnormalities. There is mild left ventricular  hypertrophy. Left ventricular diastolic  parameters were normal.   2. Right ventricular systolic function is normal. The right ventricular  size is normal. Tricuspid regurgitation signal is inadequate for assessing  PA pressure.   3. The mitral valve is normal in structure. No evidence of mitral valve  regurgitation. No evidence of mitral stenosis.   4. The aortic valve is tricuspid. Aortic valve regurgitation is not  visualized. No aortic stenosis is present.   5. The inferior vena cava is normal in size with greater than 50%  respiratory variability, suggesting right atrial pressure of 3 mmHg.   Antimicrobials: None    Subjective: No concerns overnight. Feels better since receiving blood via transfusion.  Objective: Vitals:   07/20/21 0215 07/20/21 0339 07/20/21 0445 07/20/21 1244  BP: (!) 149/99 (!) 151/97 (!) 145/100 137/87  Pulse: 100 96  99  Resp: 18 20 20 20   Temp: 99.1 F (37.3 C) 97.9 F (36.6 C) 98.9 F (37.2 C) 98.7 F (37.1 C)  TempSrc: Oral Axillary Oral Oral  SpO2: 95% 96% 96% 100%  Weight:  Height:        Intake/Output Summary (Last 24 hours) at 07/20/2021 1421 Last data filed at 07/20/2021 0831 Gross per 24 hour  Intake 1342.96 ml   Output --  Net 1342.96 ml   Filed Weights   07/19/21 0931  Weight: (!) 138.3 kg    Examination:  General exam: Appears calm and comfortable  Respiratory system: Clear to auscultation. Respiratory effort normal. Cardiovascular system: S1 & S2 heard, RRR. No murmurs, rubs, gallops or clicks. Gastrointestinal system: Abdomen is nondistended, soft and nontender. No organomegaly or masses felt. Normal bowel sounds heard. Central nervous system: Alert and oriented. No focal neurological deficits. Musculoskeletal: No edema. No calf tenderness Skin: No cyanosis. No rashes Psychiatry: Judgement and insight appear normal. Mood & affect appropriate.     Data Reviewed: I have personally reviewed following labs and imaging studies  CBC Lab Results  Component Value Date   WBC 10.0 07/20/2021   RBC 1.97 (L) 07/20/2021   HGB 7.5 (L) 07/20/2021   HCT 21.1 (L) 07/20/2021   MCV 107.1 (H) 07/20/2021   MCH 38.1 (H) 07/20/2021   PLT 329 07/20/2021   MCHC 35.5 07/20/2021   RDW 19.5 (H) 07/20/2021   LYMPHSABS 2.0 07/19/2021   MONOABS 0.2 07/19/2021   EOSABS 0.1 07/19/2021   BASOSABS 0.0 07/19/2021     Last metabolic panel Lab Results  Component Value Date   NA 136 07/20/2021   K 3.0 (L) 07/20/2021   CL 101 07/20/2021   CO2 25 07/20/2021   BUN 7 07/20/2021   CREATININE <0.30 (L) 07/20/2021   GLUCOSE 137 (H) 07/20/2021   GFRNONAA NOT CALCULATED 07/20/2021   GFRAA 143 12/20/2019   CALCIUM 6.8 (L) 07/20/2021   PROT 6.4 (L) 07/19/2021   ALBUMIN 3.4 (L) 07/19/2021   LABGLOB 2.8 04/13/2017   AGRATIO 1.5 04/13/2017   BILITOT 1.2 07/19/2021   ALKPHOS 117 07/19/2021   AST 46 (H) 07/19/2021   ALT 23 07/19/2021   ANIONGAP 10 07/20/2021    CBG (last 3)  Recent Labs    07/19/21 0924  GLUCAP 166*     GFR: CrCl cannot be calculated (This lab value cannot be used to calculate CrCl because it is not a number: <0.30).  Coagulation Profile: No results for input(s): INR, PROTIME in  the last 168 hours.  Recent Results (from the past 240 hour(s))  Resp Panel by RT-PCR (Flu A&B, Covid) Nasopharyngeal Swab     Status: None   Collection Time: 07/19/21  1:00 PM   Specimen: Nasopharyngeal Swab; Nasopharyngeal(NP) swabs in vial transport medium  Result Value Ref Range Status   SARS Coronavirus 2 by RT PCR NEGATIVE NEGATIVE Final    Comment: (NOTE) SARS-CoV-2 target nucleic acids are NOT DETECTED.  The SARS-CoV-2 RNA is generally detectable in upper respiratory specimens during the acute phase of infection. The lowest concentration of SARS-CoV-2 viral copies this assay can detect is 138 copies/mL. A negative result does not preclude SARS-Cov-2 infection and should not be used as the sole basis for treatment or other patient management decisions. A negative result may occur with  improper specimen collection/handling, submission of specimen other than nasopharyngeal swab, presence of viral mutation(s) within the areas targeted by this assay, and inadequate number of viral copies(<138 copies/mL). A negative result must be combined with clinical observations, patient history, and epidemiological information. The expected result is Negative.  Fact Sheet for Patients:  BloggerCourse.com  Fact Sheet for Healthcare Providers:  SeriousBroker.it  This test is no  t yet approved or cleared by the [place] and  has been authorized for detection and/or diagnosis of SARS-CoV-2 by FDA under an Emergency Use Authorization (EUA). This EUA will remain  in effect (meaning this test can be used) for the duration of the COVID-19 declaration under Section 564(b)(1) of the Act, 21 U.S.C.section 360bbb-3(b)(1), unless the authorization is terminated  or revoked sooner.       Influenza A by PCR NEGATIVE NEGATIVE Final   Influenza B by PCR NEGATIVE NEGATIVE Final    Comment: (NOTE) The Xpert Xpress SARS-CoV-2/FLU/RSV plus assay  is intended as an aid in the diagnosis of influenza from Nasopharyngeal swab specimens and should not be used as a sole basis for treatment. Nasal washings and aspirates are unacceptable for Xpert Xpress SARS-CoV-2/FLU/RSV testing.  Fact Sheet for Patients: BloggerCourse.com  Fact Sheet for Healthcare Providers: SeriousBroker.it  This test is not yet approved or cleared by the [place] FDA and has been authorized for detection and/or diagnosis of SARS-CoV-2 by FDA under an Emergency Use Authorization (EUA). This EUA will remain in effect (meaning this test can be used) for the duration of the COVID-19 declaration under Section 564(b)(1) of the Act, 21 U.S.C. section 360bbb-3(b)(1), unless the authorization is terminated or revoked.  Performed at [facility], [address] [place], [place] [address]         Radiology Studies: CT Angio Head W or Wo Contrast  Result Date: 07/19/2021 CLINICAL DATA:  Neuro deficit, acute, stroke suspected. Headache, left sided neck pain, leg weakness, concern for stroke, dissection. Loss of consciousness after standing up, hitting the head on the floor. EXAM: CT ANGIOGRAPHY HEAD AND NECK TECHNIQUE: Multidetector CT imaging of the head and neck was performed using the standard protocol during bolus administration of intravenous contrast. Multiplanar CT image reconstructions and MIPs were obtained to evaluate the vascular anatomy. Carotid stenosis measurements (when applicable) are obtained utilizing NASCET criteria, using the distal internal carotid diameter as the denominator. CONTRAST:  31mL OMNIPAQUE IOHEXOL 350 MG/ML SOLN COMPARISON:  None. FINDINGS: CT HEAD FINDINGS Brain: There is no evidence of an acute infarct, intracranial hemorrhage, mass, midline shift, or extra-axial fluid collection. The ventricles and sulci are normal. Vascular: No hyperdense vessel. Skull: No acute  fracture or suspicious osseous lesion. Sinuses: Visualized paranasal sinuses and mastoid air cells are clear. Orbits: Unremarkable. Review of the MIP images confirms the above findings CTA NECK FINDINGS Aortic arch: Standard 3 vessel aortic arch with widely patent arch vessel origins. Right carotid system: Patent with a small amount of calcified and soft plaque at the carotid bifurcation. No evidence of a significant stenosis or dissection. Retropharyngeal course of the distal common and proximal internal carotid arteries. Left carotid system: Patent with a small amount of calcified and soft plaque at the carotid bifurcation. No evidence of a significant stenosis or dissection. Vertebral arteries: Patent without evidence of a significant stenosis, dissection, or significant atherosclerosis. Dominant left vertebral artery. Skeleton: No acute osseous abnormality or suspicious osseous lesion is identified. The cervical spine is reported separately. Other neck: No evidence of cervical lymphadenopathy or mass. Upper chest: Clear lung apices. Review of the MIP images confirms the above findings CTA HEAD FINDINGS Anterior circulation: The internal carotid arteries are patent from skull base to carotid termini with minimal nonstenotic plaque bilaterally. ACAs and MCAs are patent without evidence of a proximal branch occlusion or significant proximal stenosis. No aneurysm is identified. Posterior circulation: The intracranial vertebral arteries are patent to  the basilar. The basilar artery is widely patent. There are small posterior communicating arteries bilaterally. Both PCAs are patent without evidence of a significant proximal stenosis. No aneurysm is identified. Venous sinuses: Patent. Anatomic variants: None. Review of the MIP images confirms the above findings IMPRESSION: 1. Unremarkable CT appearance of the brain. No evidence of acute intracranial abnormality. 2. Minimal atherosclerosis in the head and neck without  large vessel occlusion, significant stenosis, dissection, or aneurysm. Electronically Signed   By: Sebastian Ache M.D.   On: 07/19/2021 12:03   CT Angio Neck W and/or Wo Contrast  Result Date: 07/19/2021 CLINICAL DATA:  Neuro deficit, acute, stroke suspected. Headache, left sided neck pain, leg weakness, concern for stroke, dissection. Loss of consciousness after standing up, hitting the head on the floor. EXAM: CT ANGIOGRAPHY HEAD AND NECK TECHNIQUE: Multidetector CT imaging of the head and neck was performed using the standard protocol during bolus administration of intravenous contrast. Multiplanar CT image reconstructions and MIPs were obtained to evaluate the vascular anatomy. Carotid stenosis measurements (when applicable) are obtained utilizing NASCET criteria, using the distal internal carotid diameter as the denominator. CONTRAST:  8mL OMNIPAQUE IOHEXOL 350 MG/ML SOLN COMPARISON:  None. FINDINGS: CT HEAD FINDINGS Brain: There is no evidence of an acute infarct, intracranial hemorrhage, mass, midline shift, or extra-axial fluid collection. The ventricles and sulci are normal. Vascular: No hyperdense vessel. Skull: No acute fracture or suspicious osseous lesion. Sinuses: Visualized paranasal sinuses and mastoid air cells are clear. Orbits: Unremarkable. Review of the MIP images confirms the above findings CTA NECK FINDINGS Aortic arch: Standard 3 vessel aortic arch with widely patent arch vessel origins. Right carotid system: Patent with a small amount of calcified and soft plaque at the carotid bifurcation. No evidence of a significant stenosis or dissection. Retropharyngeal course of the distal common and proximal internal carotid arteries. Left carotid system: Patent with a small amount of calcified and soft plaque at the carotid bifurcation. No evidence of a significant stenosis or dissection. Vertebral arteries: Patent without evidence of a significant stenosis, dissection, or significant  atherosclerosis. Dominant left vertebral artery. Skeleton: No acute osseous abnormality or suspicious osseous lesion is identified. The cervical spine is reported separately. Other neck: No evidence of cervical lymphadenopathy or mass. Upper chest: Clear lung apices. Review of the MIP images confirms the above findings CTA HEAD FINDINGS Anterior circulation: The internal carotid arteries are patent from skull base to carotid termini with minimal nonstenotic plaque bilaterally. ACAs and MCAs are patent without evidence of a proximal branch occlusion or significant proximal stenosis. No aneurysm is identified. Posterior circulation: The intracranial vertebral arteries are patent to the basilar. The basilar artery is widely patent. There are small posterior communicating arteries bilaterally. Both PCAs are patent without evidence of a significant proximal stenosis. No aneurysm is identified. Venous sinuses: Patent. Anatomic variants: None. Review of the MIP images confirms the above findings IMPRESSION: 1. Unremarkable CT appearance of the brain. No evidence of acute intracranial abnormality. 2. Minimal atherosclerosis in the head and neck without large vessel occlusion, significant stenosis, dissection, or aneurysm. Electronically Signed   By: Sebastian Ache M.D.   On: 07/19/2021 12:03   CT C-SPINE NO CHARGE  Result Date: 07/19/2021 CLINICAL DATA:  Neck trauma. Loss of consciousness after standing up, hitting the head on the floor. EXAM: CT CERVICAL SPINE WITH CONTRAST TECHNIQUE: Multiplanar CT images of the cervical spine were reconstructed from contemporary CTA of the neck. CONTRAST:  No additional COMPARISON:  None FINDINGS: Alignment:  Straightening of the normal cervical lordosis. No listhesis. Skull base and vertebrae: No acute fracture or suspicious osseous lesion. Soft tissues and spinal canal: No prevertebral fluid or swelling. No visible canal hematoma. Disc levels: Preserved disc space heights. Moderate  right neural foraminal stenosis at C3-4 due to uncovertebral spurring. Upper chest: Clear lung apices. Other: None. IMPRESSION: No acute cervical spine fracture. Electronically Signed   By: Sebastian Ache M.D.   On: 07/19/2021 12:06   DG Chest Portable 1 View  Result Date: 07/19/2021 CLINICAL DATA:  Syncope. EXAM: PORTABLE CHEST 1 VIEW COMPARISON:  CT angiogram chest 03/18/2021. Chest radiographs 03/15/2021. FINDINGS: Shallow inspiration radiograph. Heart size within normal limits. No appreciable airspace consolidation. No evidence of pleural effusion or pneumothorax. No acute bony abnormality identified. IMPRESSION: Shallow inspiration radiograph. No evidence of acute cardiopulmonary abnormality. Electronically Signed   By: Jackey Loge D.O.   On: 07/19/2021 10:14   ECHOCARDIOGRAM COMPLETE  Result Date: 07/20/2021    ECHOCARDIOGRAM REPORT   Patient Name:   Norma Ramsey Date of Exam: 07/20/2021 Medical Rec #:  [id number]       Height:       63.0 in Accession #:    [id number]      Weight:       305.0 lb Date of Birth:  12-31-1985      BSA:          2.313 m Patient Age:    35 years        BP:           131/83 mmHg Patient Gender: F               HR:           89 bpm. Exam Location:  Inpatient Procedure: 2D Echo, Color Doppler and Cardiac Doppler Indications:    Syncope  History:        Patient has no prior history of Echocardiogram examinations.                 Signs/Symptoms:Syncope.  Sonographer:    Roosvelt Maser RDCS Referring Phys: [id number] ALLISON WOLFE IMPRESSIONS  1. Left ventricular ejection fraction, by estimation, is 65 to 70%. The left ventricle has hyperdynamic function. The left ventricle has no regional wall motion abnormalities. There is mild left ventricular hypertrophy. Left ventricular diastolic parameters were normal.  2. Right ventricular systolic function is normal. The right ventricular size is normal. Tricuspid regurgitation signal is inadequate for assessing PA pressure.  3. The mitral  valve is normal in structure. No evidence of mitral valve regurgitation. No evidence of mitral stenosis.  4. The aortic valve is tricuspid. Aortic valve regurgitation is not visualized. No aortic stenosis is present.  5. The inferior vena cava is normal in size with greater than 50% respiratory variability, suggesting right atrial pressure of 3 mmHg. FINDINGS  Left Ventricle: Left ventricular ejection fraction, by estimation, is 65 to 70%. The left ventricle has hyperdynamic function. The left ventricle has no regional wall motion abnormalities. The left ventricular internal cavity size was normal in size. There is mild left ventricular hypertrophy. Left ventricular diastolic parameters were normal. Right Ventricle: The right ventricular size is normal. No increase in right ventricular wall thickness. Right ventricular systolic function is normal. Tricuspid regurgitation signal is inadequate for assessing PA pressure. Left Atrium: Left atrial size was normal in size. Right Atrium: Right atrial size was normal in size. Pericardium: There is no evidence of pericardial effusion. Mitral Valve: The  mitral valve is normal in structure. No evidence of mitral valve regurgitation. No evidence of mitral valve stenosis. Tricuspid Valve: The tricuspid valve is normal in structure. Tricuspid valve regurgitation is not demonstrated. Aortic Valve: The aortic valve is tricuspid. Aortic valve regurgitation is not visualized. No aortic stenosis is present. Aortic valve mean gradient measures 5.0 mmHg. Aortic valve peak gradient measures 9.6 mmHg. Aortic valve area, by VTI measures 2.62 cm. Pulmonic Valve: The pulmonic valve was normal in structure. Pulmonic valve regurgitation is not visualized. Aorta: The aortic root is normal in size and structure. Venous: The inferior vena cava is normal in size with greater than 50% respiratory variability, suggesting right atrial pressure of 3 mmHg. IAS/Shunts: No atrial level shunt detected by  color flow Doppler.  LEFT VENTRICLE PLAX 2D LVIDd:         4.20 cm   Diastology LVIDs:         3.00 cm   LV e' medial:    8.81 cm/s LV PW:         1.30 cm   LV E/e' medial:  12.9 LV IVS:        1.40 cm   LV e' lateral:   9.79 cm/s LVOT diam:     2.10 cm   LV E/e' lateral: 11.6 LV SV:         71 LV SV Index:   31 LVOT Area:     3.46 cm  LEFT ATRIUM             Index LA diam:        4.10 cm 1.77 cm/m LA Vol (A2C):   63.1 ml 27.27 ml/m LA Vol (A4C):   48.5 ml 20.96 ml/m LA Biplane Vol: 56.3 ml 24.34 ml/m  AORTIC VALVE AV Area (Vmax):    2.66 cm AV Area (Vmean):   2.50 cm AV Area (VTI):     2.62 cm AV Vmax:           155.00 cm/s AV Vmean:          107.000 cm/s AV VTI:            0.271 m AV Peak Grad:      9.6 mmHg AV Mean Grad:      5.0 mmHg LVOT Vmax:         119.00 cm/s LVOT Vmean:        77.100 cm/s LVOT VTI:          0.205 m LVOT/AV VTI ratio: 0.76  AORTA Ao Root diam: 3.10 cm Ao Asc diam:  2.80 cm MITRAL VALVE MV Area (PHT): 3.93 cm     SHUNTS MV Decel Time: 193 msec     Systemic VTI:  0.20 m MV E velocity: 114.00 cm/s  Systemic Diam: 2.10 cm MV A velocity: 96.40 cm/s MV E/A ratio:  1.18 Dalton McleanMD Electronically signed by Wilfred Lacy Signature Date/Time: 07/20/2021/11:32:53 AM    Final    US Abdomen Limited RUQ (LIVER/GB)  Result Date: 07/19/2021 CLINICAL DATA:  History of recent falls with right upper quadrant pain for 2 days EXAM: ULTRASOUND ABDOMEN LIMITED RIGHT UPPER QUADRANT COMPARISON:  None. FINDINGS: Gallbladder: No gallstones or wall thickening visualized. No sonographic Murphy sign noted by sonographer. Common bile duct: Diameter: 3.3 mm. Liver: Diffusely increased in echogenicity consistent with mild fatty infiltration. No focal mass is noted. Portal vein is patent on color Doppler imaging with normal direction of blood flow towards the liver. Other: None. IMPRESSION: Fatty  liver. No other focal abnormality is noted. Electronically Signed   By: Alcide Clever M.D.   On: 07/19/2021  22:52        Scheduled Meds:  sodium chloride   Intravenous Once   amLODipine  10 mg Oral Daily   calcium carbonate  1 tablet Oral TID   cyanocobalamin  1,000 mcg Intramuscular Daily   folic acid  1 mg Oral Daily   multivitamin with minerals  1 tablet Oral Daily   pantoprazole (PROTONIX) IV  40 mg Intravenous Q24H   sodium chloride flush  3 mL Intravenous Q12H   thiamine  100 mg Oral Daily   Or   thiamine  100 mg Intravenous Daily   Continuous Infusions:  sodium chloride 500 mL (07/19/21 1214)   sodium chloride 250 mL (07/19/21 2302)   calcium gluconate       LOS: 0 days     Jacquelin Hawking, MD [facility] 07/20/2021, 2:21 PM  If 7PM-7AM, please contact night-coverage [email or web address]

## 2021-07-21 ENCOUNTER — Other Ambulatory Visit: Payer: Self-pay | Admitting: Nurse Practitioner

## 2021-07-21 DIAGNOSIS — E538 Deficiency of other specified B group vitamins: Secondary | ICD-10-CM | POA: Diagnosis not present

## 2021-07-21 DIAGNOSIS — E119 Type 2 diabetes mellitus without complications: Secondary | ICD-10-CM

## 2021-07-21 DIAGNOSIS — D649 Anemia, unspecified: Secondary | ICD-10-CM | POA: Diagnosis not present

## 2021-07-21 DIAGNOSIS — R55 Syncope and collapse: Secondary | ICD-10-CM

## 2021-07-21 DIAGNOSIS — R112 Nausea with vomiting, unspecified: Secondary | ICD-10-CM

## 2021-07-21 DIAGNOSIS — I10 Essential (primary) hypertension: Secondary | ICD-10-CM | POA: Diagnosis not present

## 2021-07-21 DIAGNOSIS — R1011 Right upper quadrant pain: Secondary | ICD-10-CM

## 2021-07-21 LAB — GLUCOSE, CAPILLARY
Glucose-Capillary: 129 mg/dL — ABNORMAL HIGH (ref 70–99)
Glucose-Capillary: 149 mg/dL — ABNORMAL HIGH (ref 70–99)

## 2021-07-21 LAB — TYPE AND SCREEN
ABO/RH(D): B POS
Antibody Screen: NEGATIVE
Unit division: 0

## 2021-07-21 LAB — ANA W/REFLEX IF POSITIVE: Anti Nuclear Antibody (ANA): NEGATIVE

## 2021-07-21 LAB — CBC
HCT: 21.8 % — ABNORMAL LOW (ref 36.0–46.0)
Hemoglobin: 7.6 g/dL — ABNORMAL LOW (ref 12.0–15.0)
MCH: 37.6 pg — ABNORMAL HIGH (ref 26.0–34.0)
MCHC: 34.9 g/dL (ref 30.0–36.0)
MCV: 107.9 fL — ABNORMAL HIGH (ref 80.0–100.0)
Platelets: 329 10*3/uL (ref 150–400)
RBC: 2.02 MIL/uL — ABNORMAL LOW (ref 3.87–5.11)
RDW: 18.5 % — ABNORMAL HIGH (ref 11.5–15.5)
WBC: 10.9 10*3/uL — ABNORMAL HIGH (ref 4.0–10.5)
nRBC: 0 % (ref 0.0–0.2)

## 2021-07-21 LAB — COMPREHENSIVE METABOLIC PANEL
ALT: 21 U/L (ref 0–44)
AST: 38 U/L (ref 15–41)
Albumin: 3.3 g/dL — ABNORMAL LOW (ref 3.5–5.0)
Alkaline Phosphatase: 102 U/L (ref 38–126)
Anion gap: 7 (ref 5–15)
BUN: 7 mg/dL (ref 6–20)
CO2: 25 mmol/L (ref 22–32)
Calcium: 7.4 mg/dL — ABNORMAL LOW (ref 8.9–10.3)
Chloride: 102 mmol/L (ref 98–111)
Creatinine, Ser: 0.37 mg/dL — ABNORMAL LOW (ref 0.44–1.00)
GFR, Estimated: >60 mL/min (ref >60)
Glucose, Bld: 142 mg/dL — ABNORMAL HIGH (ref 70–99)
Potassium: 3.1 mmol/L — ABNORMAL LOW (ref 3.5–5.1)
Sodium: 134 mmol/L — ABNORMAL LOW (ref 135–145)
Total Bilirubin: 1.3 mg/dL — ABNORMAL HIGH (ref 0.3–1.2)
Total Protein: 6.1 g/dL — ABNORMAL LOW (ref 6.5–8.1)

## 2021-07-21 LAB — HAPTOGLOBIN: Haptoglobin: <10 mg/dL — ABNORMAL LOW (ref 33–278)

## 2021-07-21 LAB — BPAM RBC
Blood Product Expiration Date: 202210292359
ISSUE DATE / TIME: 202210180145
Unit Type and Rh: 7300

## 2021-07-21 LAB — RHEUMATOID FACTOR: Rheumatoid fact SerPl-aCnc: <10 IU/mL (ref <14.0)

## 2021-07-21 MED ORDER — METFORMIN HCL 500 MG PO TABS: 500.0000 mg | ORAL_TABLET | Freq: Two times a day (BID) | ORAL | 2 refills | Status: DC

## 2021-07-21 MED ORDER — MAGNESIUM 400 MG PO CAPS: 1.0000 | ORAL_CAPSULE | Freq: Every day | ORAL | 0 refills | Status: AC

## 2021-07-21 MED ORDER — POTASSIUM CHLORIDE CRYS ER 20 MEQ PO TBCR: 20.0000 meq | EXTENDED_RELEASE_TABLET | Freq: Every day | ORAL | 0 refills | Status: DC

## 2021-07-21 MED ORDER — VITAMIN B-12 1000 MCG PO TABS: 1000.0000 ug | ORAL_TABLET | Freq: Every day | ORAL | 0 refills | Status: DC

## 2021-07-21 MED ORDER — POTASSIUM CHLORIDE CRYS ER 20 MEQ PO TBCR
40.0000 meq | EXTENDED_RELEASE_TABLET | Freq: Once | ORAL | Status: AC
  Administered 2021-07-21: 40 meq via ORAL
  Filled 2021-07-21: qty 2

## 2021-07-21 MED ORDER — FOLIC ACID 1 MG PO TABS: 1.0000 mg | ORAL_TABLET | Freq: Every day | ORAL | 0 refills | Status: DC

## 2021-07-21 MED ORDER — PANTOPRAZOLE SODIUM 40 MG PO TBEC: 40.0000 mg | DELAYED_RELEASE_TABLET | Freq: Every day | ORAL | Status: DC

## 2021-07-21 MED ORDER — ADULT MULTIVITAMIN W/MINERALS CH: 1.0000 | ORAL_TABLET | Freq: Every day | ORAL | 0 refills | Status: AC

## 2021-07-21 NOTE — Progress Notes (Signed)
Nutrition Brief Note  RD consulted for nutritional assessment and diet education (diabetes). Pt's HgbA1c: 7.8.  Pt also identified with vitamin B-12 and folate deficiency.  Pt reports she was not eating well PTA, skipping meals all day until dinner time.   Provided "Carbohydrate Counting" handout. Encouraged pt to eat 3 meals consistently, no skipping. Encouraged intake of protein shakes if unable to consume a meal. Also recommended taking a daily MVI.  Pt dressed and ready to discharge.   Tilda Franco, MS, RD, LDN Inpatient Clinical Dietitian Contact information available via Amion

## 2021-07-21 NOTE — Discharge Summary (Signed)
Physician Discharge Summary  Norma Ramsey ZOX:[id number] DOB: 1986-01-29 DOA: 07/19/2021  PCP: Westley Chandler, MD  Admit date: 07/19/2021 Discharge date: 07/21/2021  Admitted From: Home  Discharge disposition: Home  Recommendations for Outpatient Follow-Up:   Follow up with your primary care provider in one week.  Check CBC, BMP, magnesium in the next visit Patient had significant vitamin B12 deficiency.  We will need to check vitamin B12/folic acid in the next visit  and continue to replenish as necessary Patient is newly been diagnosed of diabetes.  Has been started on metformin 500 twice daily due to her morbid obesity and comorbidities.  Please adjust doses and arrange for follow-up  Discharge Diagnosis:   Principal Problem:   Symptomatic anemia Active Problems:   Essential hypertension   Nausea and vomiting   Prediabetes   Syncope   RUQ pain   B12 deficiency   Discharge Condition: Improved.  Diet recommendation: Low sodium, heart healthy.  Diabetic diet  Wound care: None.  Code status: Full.  History of Present Illness:   Norma Ramsey is a 35 y.o. female with a history of prediabetes presented to hospital with syncopal episode.  No chest pain prior to the event.  Patient was noted to have significant anemia in received PRBC transfusion in the hospital.  Patient was then admitted hospital for further evaluation and treatment.  Hospital Course:   Following conditions were addressed during hospitalization as listed below,  Symptomatic anemia Presented with syncope.  Hemoglobin was 6.9 on admission and received 1 unit of packed RBC.  Previous baseline of 13.7 in June 2022.  Hematology was consulted and was thought to have severe vitamin B12 deficiency.  Spoke with him at oncology prior to discharge.  Patient is symptomatically feeling better at this time.  He recommended continuation of vitamin B12 and folic acid on discharge and PCP follow-up with vitamin  B12, folic acid in 1 week.     Macrocytic anemia secondary to vitamin B12 and folic acid deficiency. LDH was elevated.  Peripheral blood smear was reviewed by enroute oncology.  Haptoglobin low.  Thought to be secondary to vitamin B12 and folic acid deficiency.  Continue supplementation on discharge.  Patient was advised against alcohol usage.   Syncope Secondary to anemia.  2D echocardiogram with preserved LV function.  No abnormal rhythm on telemetry.   Nausea/vomiting Unsure of etiology.  This has resolved at this time.   RUQ pain None at this time.  RUQ ultrasound showed significant for fatty liver disease.     Hypocalcemia Improved at this time.   Essential hypertension Continue amlodipine from home   Diabetes mellitus, type 2, new diagnosis Patient was previously in prediabetic range.  Hemoglobin A1c of 7.8% this admission.  Patient might benefit from metformin and diet modification on discharge.  Spoke with the patient about it.  We will start the patient on metformin 500 mg twice a day.  This will need to be adjusted as outpatient.   Alcohol use Patient states that that she drinks 1 or 2 drinks few times a week.  I have explained to her about the need for quitting alcohol.  There was no signs of withdrawal during hospitalization.   Morbid obesity Body mass index is 54.03 kg/m.  Patient would benefit from weight loss as outpatient.  Disposition.  At this time, patient is stable for disposition home with outpatient PCP follow-up.  Medical Consultants:   Hematology  Procedures:    2 D Echocardiogram  Subjective:   Today, patient was seen and examined at bedside.  Feels little better today.  Was able to ambulate.  Denies any dizziness, lightheadedness shortness of breath chest pain or fever.  Discharge Exam:   Vitals:   07/20/21 2007 07/21/21 0343  BP: (!) 148/86 (!) 143/92  Pulse: 99 95  Resp: 20 20  Temp: 99.2 F (37.3 C) 98.1 F (36.7 C)  SpO2: 98% 99%    Vitals:   07/20/21 0445 07/20/21 1244 07/20/21 2007 07/21/21 0343  BP: (!) 145/100 137/87 (!) 148/86 (!) 143/92  Pulse:  99 99 95  Resp: 20 20 20 20   Temp: 98.9 F (37.2 C) 98.7 F (37.1 C) 99.2 F (37.3 C) 98.1 F (36.7 C)  TempSrc: Oral Oral Oral Oral  SpO2: 96% 100% 98% 99%  Weight:      Height:       Body mass index is 54.03 kg/m.   General: Alert awake, not in obvious distress, morbidly obese HENT: pupils equally reacting to light,  No scleral pallor or icterus noted. Oral mucosa is moist.  Chest:   Diminished breath sounds bilaterally. No crackles or wheezes.  CVS: S1 &S2 heard. No murmur.  Regular rate and rhythm. Abdomen: Soft, nontender, nondistended.  Bowel sounds are heard.   Extremities: No cyanosis, clubbing or edema.  Peripheral pulses are palpable. Psych: Alert, awake and oriented, normal mood CNS:  No cranial nerve deficits.  Power equal in all extremities.   Skin: Warm and dry.  No rashes noted.  The results of significant diagnostics from this hospitalization (including imaging, microbiology, ancillary and laboratory) are listed below for reference.     Diagnostic Studies:   CT Angio Head W or Wo Contrast  Result Date: 07/19/2021 CLINICAL DATA:  Neuro deficit, acute, stroke suspected. Headache, left sided neck pain, leg weakness, concern for stroke, dissection. Loss of consciousness after standing up, hitting the head on the floor. EXAM: CT ANGIOGRAPHY HEAD AND NECK TECHNIQUE: Multidetector CT imaging of the head and neck was performed using the standard protocol during bolus administration of intravenous contrast. Multiplanar CT image reconstructions and MIPs were obtained to evaluate the vascular anatomy. Carotid stenosis measurements (when applicable) are obtained utilizing NASCET criteria, using the distal internal carotid diameter as the denominator. CONTRAST:  77mL OMNIPAQUE IOHEXOL 350 MG/ML SOLN COMPARISON:  None. FINDINGS: CT HEAD FINDINGS Brain:  There is no evidence of an acute infarct, intracranial hemorrhage, mass, midline shift, or extra-axial fluid collection. The ventricles and sulci are normal. Vascular: No hyperdense vessel. Skull: No acute fracture or suspicious osseous lesion. Sinuses: Visualized paranasal sinuses and mastoid air cells are clear. Orbits: Unremarkable. Review of the MIP images confirms the above findings CTA NECK FINDINGS Aortic arch: Standard 3 vessel aortic arch with widely patent arch vessel origins. Right carotid system: Patent with a small amount of calcified and soft plaque at the carotid bifurcation. No evidence of a significant stenosis or dissection. Retropharyngeal course of the distal common and proximal internal carotid arteries. Left carotid system: Patent with a small amount of calcified and soft plaque at the carotid bifurcation. No evidence of a significant stenosis or dissection. Vertebral arteries: Patent without evidence of a significant stenosis, dissection, or significant atherosclerosis. Dominant left vertebral artery. Skeleton: No acute osseous abnormality or suspicious osseous lesion is identified. The cervical spine is reported separately. Other neck: No evidence of cervical lymphadenopathy or mass. Upper chest: Clear lung apices. Review of the MIP images confirms the above findings CTA HEAD FINDINGS  Anterior circulation: The internal carotid arteries are patent from skull base to carotid termini with minimal nonstenotic plaque bilaterally. ACAs and MCAs are patent without evidence of a proximal branch occlusion or significant proximal stenosis. No aneurysm is identified. Posterior circulation: The intracranial vertebral arteries are patent to the basilar. The basilar artery is widely patent. There are small posterior communicating arteries bilaterally. Both PCAs are patent without evidence of a significant proximal stenosis. No aneurysm is identified. Venous sinuses: Patent. Anatomic variants: None. Review  of the MIP images confirms the above findings IMPRESSION: 1. Unremarkable CT appearance of the brain. No evidence of acute intracranial abnormality. 2. Minimal atherosclerosis in the head and neck without large vessel occlusion, significant stenosis, dissection, or aneurysm. Electronically Signed   By: Sebastian Ache M.D.   On: 07/19/2021 12:03   CT Angio Neck W and/or Wo Contrast  Result Date: 07/19/2021 CLINICAL DATA:  Neuro deficit, acute, stroke suspected. Headache, left sided neck pain, leg weakness, concern for stroke, dissection. Loss of consciousness after standing up, hitting the head on the floor. EXAM: CT ANGIOGRAPHY HEAD AND NECK TECHNIQUE: Multidetector CT imaging of the head and neck was performed using the standard protocol during bolus administration of intravenous contrast. Multiplanar CT image reconstructions and MIPs were obtained to evaluate the vascular anatomy. Carotid stenosis measurements (when applicable) are obtained utilizing NASCET criteria, using the distal internal carotid diameter as the denominator. CONTRAST:  75mL OMNIPAQUE IOHEXOL 350 MG/ML SOLN COMPARISON:  None. FINDINGS: CT HEAD FINDINGS Brain: There is no evidence of an acute infarct, intracranial hemorrhage, mass, midline shift, or extra-axial fluid collection. The ventricles and sulci are normal. Vascular: No hyperdense vessel. Skull: No acute fracture or suspicious osseous lesion. Sinuses: Visualized paranasal sinuses and mastoid air cells are clear. Orbits: Unremarkable. Review of the MIP images confirms the above findings CTA NECK FINDINGS Aortic arch: Standard 3 vessel aortic arch with widely patent arch vessel origins. Right carotid system: Patent with a small amount of calcified and soft plaque at the carotid bifurcation. No evidence of a significant stenosis or dissection. Retropharyngeal course of the distal common and proximal internal carotid arteries. Left carotid system: Patent with a small amount of calcified  and soft plaque at the carotid bifurcation. No evidence of a significant stenosis or dissection. Vertebral arteries: Patent without evidence of a significant stenosis, dissection, or significant atherosclerosis. Dominant left vertebral artery. Skeleton: No acute osseous abnormality or suspicious osseous lesion is identified. The cervical spine is reported separately. Other neck: No evidence of cervical lymphadenopathy or mass. Upper chest: Clear lung apices. Review of the MIP images confirms the above findings CTA HEAD FINDINGS Anterior circulation: The internal carotid arteries are patent from skull base to carotid termini with minimal nonstenotic plaque bilaterally. ACAs and MCAs are patent without evidence of a proximal branch occlusion or significant proximal stenosis. No aneurysm is identified. Posterior circulation: The intracranial vertebral arteries are patent to the basilar. The basilar artery is widely patent. There are small posterior communicating arteries bilaterally. Both PCAs are patent without evidence of a significant proximal stenosis. No aneurysm is identified. Venous sinuses: Patent. Anatomic variants: None. Review of the MIP images confirms the above findings IMPRESSION: 1. Unremarkable CT appearance of the brain. No evidence of acute intracranial abnormality. 2. Minimal atherosclerosis in the head and neck without large vessel occlusion, significant stenosis, dissection, or aneurysm. Electronically Signed   By: Sebastian Ache M.D.   On: 07/19/2021 12:03   CT C-SPINE NO CHARGE  Result Date: 07/19/2021  CLINICAL DATA:  Neck trauma. Loss of consciousness after standing up, hitting the head on the floor. EXAM: CT CERVICAL SPINE WITH CONTRAST TECHNIQUE: Multiplanar CT images of the cervical spine were reconstructed from contemporary CTA of the neck. CONTRAST:  No additional COMPARISON:  None FINDINGS: Alignment: Straightening of the normal cervical lordosis. No listhesis. Skull base and vertebrae:  No acute fracture or suspicious osseous lesion. Soft tissues and spinal canal: No prevertebral fluid or swelling. No visible canal hematoma. Disc levels: Preserved disc space heights. Moderate right neural foraminal stenosis at C3-4 due to uncovertebral spurring. Upper chest: Clear lung apices. Other: None. IMPRESSION: No acute cervical spine fracture. Electronically Signed   By: Sebastian Ache M.D.   On: 07/19/2021 12:06   DG Chest Portable 1 View  Result Date: 07/19/2021 CLINICAL DATA:  Syncope. EXAM: PORTABLE CHEST 1 VIEW COMPARISON:  CT angiogram chest 03/18/2021. Chest radiographs 03/15/2021. FINDINGS: Shallow inspiration radiograph. Heart size within normal limits. No appreciable airspace consolidation. No evidence of pleural effusion or pneumothorax. No acute bony abnormality identified. IMPRESSION: Shallow inspiration radiograph. No evidence of acute cardiopulmonary abnormality. Electronically Signed   By: Jackey Loge D.O.   On: 07/19/2021 10:14   ECHOCARDIOGRAM COMPLETE  Result Date: 07/20/2021    ECHOCARDIOGRAM REPORT   Patient Name:   Norma Ramsey Date of Exam: 07/20/2021 Medical Rec #:  [id number]       Height:       63.0 in Accession #:    [id number]      Weight:       305.0 lb Date of Birth:  1986/05/24      BSA:          2.313 m Patient Age:    35 years        BP:           131/83 mmHg Patient Gender: F               HR:           89 bpm. Exam Location:  Inpatient Procedure: 2D Echo, Color Doppler and Cardiac Doppler Indications:    Syncope  History:        Patient has no prior history of Echocardiogram examinations.                 Signs/Symptoms:Syncope.  Sonographer:    Roosvelt Maser RDCS Referring Phys: [id number] ALLISON WOLFE IMPRESSIONS  1. Left ventricular ejection fraction, by estimation, is 65 to 70%. The left ventricle has hyperdynamic function. The left ventricle has no regional wall motion abnormalities. There is mild left ventricular hypertrophy. Left ventricular diastolic  parameters were normal.  2. Right ventricular systolic function is normal. The right ventricular size is normal. Tricuspid regurgitation signal is inadequate for assessing PA pressure.  3. The mitral valve is normal in structure. No evidence of mitral valve regurgitation. No evidence of mitral stenosis.  4. The aortic valve is tricuspid. Aortic valve regurgitation is not visualized. No aortic stenosis is present.  5. The inferior vena cava is normal in size with greater than 50% respiratory variability, suggesting right atrial pressure of 3 mmHg. FINDINGS  Left Ventricle: Left ventricular ejection fraction, by estimation, is 65 to 70%. The left ventricle has hyperdynamic function. The left ventricle has no regional wall motion abnormalities. The left ventricular internal cavity size was normal in size. There is mild left ventricular hypertrophy. Left ventricular diastolic parameters were normal. Right Ventricle: The right ventricular size is normal. No increase in  right ventricular wall thickness. Right ventricular systolic function is normal. Tricuspid regurgitation signal is inadequate for assessing PA pressure. Left Atrium: Left atrial size was normal in size. Right Atrium: Right atrial size was normal in size. Pericardium: There is no evidence of pericardial effusion. Mitral Valve: The mitral valve is normal in structure. No evidence of mitral valve regurgitation. No evidence of mitral valve stenosis. Tricuspid Valve: The tricuspid valve is normal in structure. Tricuspid valve regurgitation is not demonstrated. Aortic Valve: The aortic valve is tricuspid. Aortic valve regurgitation is not visualized. No aortic stenosis is present. Aortic valve mean gradient measures 5.0 mmHg. Aortic valve peak gradient measures 9.6 mmHg. Aortic valve area, by VTI measures 2.62 cm. Pulmonic Valve: The pulmonic valve was normal in structure. Pulmonic valve regurgitation is not visualized. Aorta: The aortic root is normal in size  and structure. Venous: The inferior vena cava is normal in size with greater than 50% respiratory variability, suggesting right atrial pressure of 3 mmHg. IAS/Shunts: No atrial level shunt detected by color flow Doppler.  LEFT VENTRICLE PLAX 2D LVIDd:         4.20 cm   Diastology LVIDs:         3.00 cm   LV e' medial:    8.81 cm/s LV PW:         1.30 cm   LV E/e' medial:  12.9 LV IVS:        1.40 cm   LV e' lateral:   9.79 cm/s LVOT diam:     2.10 cm   LV E/e' lateral: 11.6 LV SV:         71 LV SV Index:   31 LVOT Area:     3.46 cm  LEFT ATRIUM             Index LA diam:        4.10 cm 1.77 cm/m LA Vol (A2C):   63.1 ml 27.27 ml/m LA Vol (A4C):   48.5 ml 20.96 ml/m LA Biplane Vol: 56.3 ml 24.34 ml/m  AORTIC VALVE AV Area (Vmax):    2.66 cm AV Area (Vmean):   2.50 cm AV Area (VTI):     2.62 cm AV Vmax:           155.00 cm/s AV Vmean:          107.000 cm/s AV VTI:            0.271 m AV Peak Grad:      9.6 mmHg AV Mean Grad:      5.0 mmHg LVOT Vmax:         119.00 cm/s LVOT Vmean:        77.100 cm/s LVOT VTI:          0.205 m LVOT/AV VTI ratio: 0.76  AORTA Ao Root diam: 3.10 cm Ao Asc diam:  2.80 cm MITRAL VALVE MV Area (PHT): 3.93 cm     SHUNTS MV Decel Time: 193 msec     Systemic VTI:  0.20 m MV E velocity: 114.00 cm/s  Systemic Diam: 2.10 cm MV A velocity: 96.40 cm/s MV E/A ratio:  1.18 Dalton McleanMD Electronically signed by Wilfred Lacy Signature Date/Time: 07/20/2021/11:32:53 AM    Final    US Abdomen Limited RUQ (LIVER/GB)  Result Date: 07/19/2021 CLINICAL DATA:  History of recent falls with right upper quadrant pain for 2 days EXAM: ULTRASOUND ABDOMEN LIMITED RIGHT UPPER QUADRANT COMPARISON:  None. FINDINGS: Gallbladder: No gallstones or wall thickening visualized. No  sonographic Eulah Pont sign noted by sonographer. Common bile duct: Diameter: 3.3 mm. Liver: Diffusely increased in echogenicity consistent with mild fatty infiltration. No focal mass is noted. Portal vein is patent on color Doppler  imaging with normal direction of blood flow towards the liver. Other: None. IMPRESSION: Fatty liver. No other focal abnormality is noted. Electronically Signed   By: Alcide Clever M.D.   On: 07/19/2021 22:52     Labs:   Basic Metabolic Panel: Recent Labs  Lab 07/19/21 0926 07/19/21 1808 07/20/21 0635 07/21/21 0502  NA 138  --  136 134*  K 2.8*   < > 3.0* 3.1*  CL 99  --  101 102  CO2 27  --  25 25  GLUCOSE 173*  --  137* 142*  BUN 9  --  7 7  CREATININE 0.34*  --  <0.30* 0.37*  CALCIUM 7.3*  --  6.8* 7.4*  MG 1.0*  --  1.5*  --    < > = values in this interval not displayed.   GFR Estimated Creatinine Clearance: 135.8 mL/min (A) (by C-G formula based on SCr of 0.37 mg/dL (L)). Liver Function Tests: Recent Labs  Lab 07/19/21 1808 07/21/21 0502  AST 46* 38  ALT 23 21  ALKPHOS 117 102  BILITOT 1.2 1.3*  PROT 6.4* 6.1*  ALBUMIN 3.4* 3.3*   No results for input(s): LIPASE, AMYLASE in the last 168 hours. No results for input(s): AMMONIA in the last 168 hours. Coagulation profile No results for input(s): INR, PROTIME in the last 168 hours.  CBC: Recent Labs  Lab 07/19/21 0926 07/20/21 0635 07/20/21 1516 07/21/21 0502  WBC 12.2* 10.0 11.7* 10.9*  NEUTROABS 9.7*  --   --   --   HGB 6.9* 7.5* 8.3* 7.6*  HCT 19.6* 21.1* 23.6* 21.8*  MCV 109.5* 107.1* 107.8* 107.9*  PLT 375 329 345 329   Cardiac Enzymes: No results for input(s): CKTOTAL, CKMB, CKMBINDEX, TROPONINI in the last 168 hours. BNP: Invalid input(s): POCBNP CBG: Recent Labs  Lab 07/19/21 0924 07/20/21 1633 07/20/21 2009 07/21/21 0738 07/21/21 1134  GLUCAP 166* 111* 143* 129* 149*   D-Dimer No results for input(s): DDIMER in the last 72 hours. Hgb A1c Recent Labs    07/19/21 2114  HGBA1C 7.8*   Lipid Profile No results for input(s): CHOL, HDL, LDLCALC, TRIG, CHOLHDL, LDLDIRECT in the last 72 hours. Thyroid function studies Recent Labs    07/19/21 2114  TSH 1.616   Anemia work  up Recent Labs    07/19/21 1057  VITAMINB12 145*  FOLATE 2.1*  FERRITIN 278  TIBC 304  IRON 253*  RETICCTPCT 0.7   Microbiology Recent Results (from the past 240 hour(s))  Resp Panel by RT-PCR (Flu A&B, Covid) Nasopharyngeal Swab     Status: None   Collection Time: 07/19/21  1:00 PM   Specimen: Nasopharyngeal Swab; Nasopharyngeal(NP) swabs in vial transport medium  Result Value Ref Range Status   SARS Coronavirus 2 by RT PCR NEGATIVE NEGATIVE Final    Comment: (NOTE) SARS-CoV-2 target nucleic acids are NOT DETECTED.  The SARS-CoV-2 RNA is generally detectable in upper respiratory specimens during the acute phase of infection. The lowest concentration of SARS-CoV-2 viral copies this assay can detect is 138 copies/mL. A negative result does not preclude SARS-Cov-2 infection and should not be used as the sole basis for treatment or other patient management decisions. A negative result may occur with  improper specimen collection/handling, submission of specimen other than nasopharyngeal  swab, presence of viral mutation(s) within the areas targeted by this assay, and inadequate number of viral copies(<138 copies/mL). A negative result must be combined with clinical observations, patient history, and epidemiological information. The expected result is Negative.  Fact Sheet for Patients:  BloggerCourse.com  Fact Sheet for Healthcare Providers:  SeriousBroker.it  This test is no t yet approved or cleared by the [place] FDA and  has been authorized for detection and/or diagnosis of SARS-CoV-2 by FDA under an Emergency Use Authorization (EUA). This EUA will remain  in effect (meaning this test can be used) for the duration of the COVID-19 declaration under Section 564(b)(1) of the Act, 21 U.S.C.section 360bbb-3(b)(1), unless the authorization is terminated  or revoked sooner.       Influenza A by PCR NEGATIVE NEGATIVE  Final   Influenza B by PCR NEGATIVE NEGATIVE Final    Comment: (NOTE) The Xpert Xpress SARS-CoV-2/FLU/RSV plus assay is intended as an aid in the diagnosis of influenza from Nasopharyngeal swab specimens and should not be used as a sole basis for treatment. Nasal washings and aspirates are unacceptable for Xpert Xpress SARS-CoV-2/FLU/RSV testing.  Fact Sheet for Patients: BloggerCourse.com  Fact Sheet for Healthcare Providers: SeriousBroker.it  This test is not yet approved or cleared by the [place] FDA and has been authorized for detection and/or diagnosis of SARS-CoV-2 by FDA under an Emergency Use Authorization (EUA). This EUA will remain in effect (meaning this test can be used) for the duration of the COVID-19 declaration under Section 564(b)(1) of the Act, 21 U.S.C. section 360bbb-3(b)(1), unless the authorization is terminated or revoked.  Performed at [facility], [address] [place], [place] [address]      Discharge Instructions:   Discharge Instructions     Diet - low sodium heart healthy   Complete by: As directed    Discharge instructions   Complete by: As directed    Continue to take vitamin B12 folic acid and multivitamin.  Follow-up with your primary care physician in 1 week.  Seek medical attention for worsening symptoms.  Please avoid alcoholic drinks.   Increase activity slowly   Complete by: As directed       Allergies as of 07/21/2021   No Known Allergies      Medication List     TAKE these medications    amLODipine 10 MG tablet Commonly known as: NORVASC Take 1 tablet (10 mg total) by mouth daily. Patient needs office visit before next refill. What changed: when to take this   cyclobenzaprine 10 MG tablet Commonly known as: FLEXERIL Take 0.5 tablets (5 mg total) by mouth 2 (two) times daily as needed for muscle spasms.   folic acid 1 MG tablet Commonly  known as: FOLVITE Take 1 tablet (1 mg total) by mouth daily. Start taking on: July 22, 2021   Magnesium 400 MG Caps Take 1 capsule by mouth daily.   metFORMIN 500 MG tablet Commonly known as: Glucophage Take 1 tablet (500 mg total) by mouth 2 (two) times daily with a meal.   multivitamin with minerals Tabs tablet Take 1 tablet by mouth daily. Start taking on: July 22, 2021   naproxen 500 MG tablet Commonly known as: NAPROSYN Take 1 tablet (500 mg total) by mouth 2 (two) times daily. What changed:  when to take this reasons to take this   potassium chloride SA 20 MEQ tablet Commonly known as: KLOR-CON Take 1 tablet (20 mEq total) by mouth daily for 10  days.   vitamin B-12 1000 MCG tablet Commonly known as: CYANOCOBALAMIN Take 1 tablet (1,000 mcg total) by mouth daily.         Time coordinating discharge: 39 minutes  Signed:  Guinevere Stephenson  [facility] 07/21/2021, 12:59 PM

## 2021-07-21 NOTE — Progress Notes (Signed)
Discharge instructions discussed with patient, verbalized agreement and understanding 

## 2021-07-22 LAB — FOLATE RBC
Folate, Hemolysate: 153 ng/mL
Folate, RBC: 683 ng/mL
Hematocrit: 22.4 % — ABNORMAL LOW (ref 34.0–46.6)

## 2021-07-22 LAB — PATHOLOGIST SMEAR REVIEW

## 2021-07-23 ENCOUNTER — Other Ambulatory Visit: Payer: Self-pay

## 2021-07-23 ENCOUNTER — Ambulatory Visit (INDEPENDENT_AMBULATORY_CARE_PROVIDER_SITE_OTHER): Payer: BC Managed Care – PPO | Admitting: Family Medicine

## 2021-07-23 VITALS — BP 120/95 | HR 74 | Wt 299.0 lb

## 2021-07-23 DIAGNOSIS — E876 Hypokalemia: Secondary | ICD-10-CM | POA: Diagnosis not present

## 2021-07-23 DIAGNOSIS — D649 Anemia, unspecified: Secondary | ICD-10-CM

## 2021-07-23 NOTE — Patient Instructions (Signed)
You were seen for follow-up for hospitalization due to anemia.  It is important that you continue taking your B12 and folate supplement.  We are checking some lab work today including your potassium, magnesium, and blood counts.  I will call you with the results of these when they return.  I think it is reasonable for you to return to work on Monday if you are feeling up to it.  Let us know if you have any further questions or concerns.

## 2021-07-23 NOTE — Progress Notes (Signed)
    SUBJECTIVE:   CHIEF COMPLAINT / HPI:   Hospital follow-up: Symptomatic macrocytic anemia She was admitted on 07/19/2021 with syncope and found to have anemia.  She did receive 1 unit of packed red blood cells for hemoglobin of 6.9.  She had no complaints of heavy periods, no blood in the stool, no history of sickle cell disorders or blood disorders, she did have recent history of NSAID use but only for a week or so with fecal occult negative.  She was started on a PPI.  Her anemia was noted to be microcytic and so oncology was consulted.  They recommended outpatient follow-up. Patient B12 was noted to be low at 145 and folate low at 2.1.  Per documentation patient was drinking 1-2 shots of liquor per day.  Per the hematologist note it looks like her anemia is mostly due to the severe B12 deficiency, but there could also be a component of bone marrow toxicity from alcohol use and folate deficiency.  She denies any blood in stools, melena, heavy menstrual bleeding, or other blood loss sources.  Recommended outpatient follow-up for CBC, BMP, magnesium the latter being due to hypokalemia of 3.1 on day of discharge.  She has follow-up scheduled with hematology/oncology on 11/2 for ongoing work-up of her symptomatic anemia which is macrocytic and thought to be due to the B12 deficiency.  She continues on B12 supplementation.  PERTINENT  PMH / PSH: None relevant  OBJECTIVE:   BP (!) 120/95   Pulse 74   Wt 299 lb (135.6 kg)   BMI 52.97 kg/m    General: NAD, pleasant, able to participate in exam Cardiac: RRR, no murmurs. Respiratory: Normal effort, clear to auscultation bilaterally Abdomen: Bowel sounds present, nontender Skin: warm and dry, no rashes noted Neuro: alert, no obvious focal deficits Psych: Normal affect and mood  ASSESSMENT/PLAN:   Hospital follow-up-symptomatic anemia: 35 year old female presenting for hospital follow-up after being diagnosed with symptomatic anemia.  In the  hospital she did receive 1 unit of packed red blood cells due to hemoglobin of 6.9.  It was thought that her anemia was due to vitamin B-12 and folate deficiency as it was macrocytic.  Hematology was following in the hospital and she has follow-up appointment set with them next week.  She is also getting B12 injections weekly per hematology starting later this week.  She also received B12 while in the hospital.  We are going to check a CBC, BMP, magnesium today due to her anemia as well as hypokalemia that was seen in the hospital.     Jackelyn Poling, DO [facility]

## 2021-07-24 ENCOUNTER — Telehealth: Payer: Self-pay | Admitting: Family Medicine

## 2021-07-24 ENCOUNTER — Encounter: Payer: Self-pay | Admitting: Family Medicine

## 2021-07-24 LAB — CBC
Hematocrit: 23 % — ABNORMAL LOW (ref 34.0–46.6)
Hemoglobin: 8.6 g/dL — ABNORMAL LOW (ref 11.1–15.9)
MCH: 37.4 pg — ABNORMAL HIGH (ref 26.6–33.0)
MCHC: 37.4 g/dL — ABNORMAL HIGH (ref 31.5–35.7)
MCV: 100 fL — ABNORMAL HIGH (ref 79–97)
NRBC: 1 % — ABNORMAL HIGH (ref 0–0)
Platelets: 413 10*3/uL (ref 150–450)
RBC: 2.3 x10E6/uL — CL (ref 3.77–5.28)
RDW: 16.8 % — ABNORMAL HIGH (ref 11.7–15.4)
WBC: 13.3 10*3/uL — ABNORMAL HIGH (ref 3.4–10.8)

## 2021-07-24 LAB — BASIC METABOLIC PANEL
BUN/Creatinine Ratio: 10 (ref 9–23)
BUN: 5 mg/dL — ABNORMAL LOW (ref 6–20)
CO2: 22 mmol/L (ref 20–29)
Calcium: 8.3 mg/dL — ABNORMAL LOW (ref 8.7–10.2)
Chloride: 101 mmol/L (ref 96–106)
Creatinine, Ser: 0.48 mg/dL — ABNORMAL LOW (ref 0.57–1.00)
Glucose: 132 mg/dL — ABNORMAL HIGH (ref 70–99)
Potassium: 3.7 mmol/L (ref 3.5–5.2)
Sodium: 138 mmol/L (ref 134–144)
eGFR: 127 mL/min/{1.73_m2} (ref >59)

## 2021-07-24 LAB — MAGNESIUM: Magnesium: 1.5 mg/dL — ABNORMAL LOW (ref 1.6–2.3)

## 2021-07-24 MED ORDER — AMLODIPINE BESYLATE 10 MG PO TABS: 10.0000 mg | ORAL_TABLET | Freq: Every evening | ORAL | 3 refills | Status: DC

## 2021-07-24 NOTE — Telephone Encounter (Addendum)
Called patient with results.  Patient reports she is feeling better but not fully improved.  Will give work note to return on the 24th.  Refill for amlodipine sent to pharmacy.

## 2021-07-26 ENCOUNTER — Telehealth: Payer: Self-pay

## 2021-07-26 NOTE — Telephone Encounter (Signed)
Patient calls nurse line reporting of knee pain. Patient reports she can barely put weight on it and very painful to walk. Patient is concerned due to recent hospitalization. Patient reports she has been taking all medications and supplements. Patient denies in sudden weakness in left leg or other extremities. Patient scheduled for ATC tomorrow.

## 2021-07-27 ENCOUNTER — Ambulatory Visit: Payer: BC Managed Care – PPO

## 2021-07-27 ENCOUNTER — Other Ambulatory Visit: Payer: Self-pay | Admitting: Nurse Practitioner

## 2021-07-27 DIAGNOSIS — E538 Deficiency of other specified B group vitamins: Secondary | ICD-10-CM

## 2021-07-27 NOTE — Telephone Encounter (Signed)
Yes 10/26 appropriate. Please revise letter. Thank you so much.  Terisa Starr, MD  Family Medicine Teaching Service

## 2021-07-27 NOTE — Progress Notes (Deleted)
    SUBJECTIVE:   CHIEF COMPLAINT / HPI:   Left knee pain: She was recently discharged from the hospital due to symptomatic anemia. She noticed the knee pain starting ***. Denies trauma***.  PERTINENT  PMH / PSH: ***  OBJECTIVE:   There were no vitals taken for this visit. ***  General: NAD, pleasant, able to participate in exam Cardiac: RRR, no murmurs. Respiratory: CTAB, normal effort, No wheezes, rales or rhonchi MSK: *** Neuro: alert, no obvious focal deficits Psych: Normal affect and mood  ASSESSMENT/PLAN:   No problem-specific Assessment & Plan notes found for this encounter.     Jackelyn Poling, DO [facility]    {    This will disappear when note is signed, click to select method of visit    :1}

## 2021-07-27 NOTE — Telephone Encounter (Signed)
Patient calls nurse line stating she thought the discussion was to return to work on 10/26 not 10/24. Will update the note if appropriate.

## 2021-07-27 NOTE — Telephone Encounter (Signed)
Letter revised to include return to work date of 10/26.  Sent via [employer].   Veronda Prude, RN

## 2021-07-28 ENCOUNTER — Inpatient Hospital Stay: Payer: BC Managed Care – PPO | Attending: Oncology

## 2021-07-28 ENCOUNTER — Inpatient Hospital Stay: Payer: BC Managed Care – PPO

## 2021-07-28 ENCOUNTER — Other Ambulatory Visit: Payer: BC Managed Care – PPO

## 2021-07-28 ENCOUNTER — Ambulatory Visit: Payer: BC Managed Care – PPO

## 2021-07-28 ENCOUNTER — Other Ambulatory Visit: Payer: Self-pay

## 2021-07-28 DIAGNOSIS — D539 Nutritional anemia, unspecified: Secondary | ICD-10-CM | POA: Insufficient documentation

## 2021-07-28 DIAGNOSIS — E538 Deficiency of other specified B group vitamins: Secondary | ICD-10-CM | POA: Insufficient documentation

## 2021-07-28 LAB — CBC WITH DIFFERENTIAL (CANCER CENTER ONLY)
Abs Immature Granulocytes: 0.06 10*3/uL (ref 0.00–0.07)
Basophils Absolute: 0 10*3/uL (ref 0.0–0.1)
Basophils Relative: 0 %
Eosinophils Absolute: 0.1 10*3/uL (ref 0.0–0.5)
Eosinophils Relative: 1 %
HCT: 27.5 % — ABNORMAL LOW (ref 36.0–46.0)
Hemoglobin: 8.9 g/dL — ABNORMAL LOW (ref 12.0–15.0)
Immature Granulocytes: 1 %
Lymphocytes Relative: 16 %
Lymphs Abs: 2 10*3/uL (ref 0.7–4.0)
MCH: 32.8 pg (ref 26.0–34.0)
MCHC: 32.4 g/dL (ref 30.0–36.0)
MCV: 101.5 fL — ABNORMAL HIGH (ref 80.0–100.0)
Monocytes Absolute: 0.9 10*3/uL (ref 0.1–1.0)
Monocytes Relative: 7 %
Neutro Abs: 9.6 10*3/uL — ABNORMAL HIGH (ref 1.7–7.7)
Neutrophils Relative %: 75 %
Platelet Count: 525 10*3/uL — ABNORMAL HIGH (ref 150–400)
RBC: 2.71 MIL/uL — ABNORMAL LOW (ref 3.87–5.11)
RDW: 18.6 % — ABNORMAL HIGH (ref 11.5–15.5)
WBC Count: 12.6 10*3/uL — ABNORMAL HIGH (ref 4.0–10.5)
nRBC: 0 % (ref 0.0–0.2)

## 2021-07-28 MED ORDER — CYANOCOBALAMIN 1000 MCG/ML IJ SOLN
1000.0000 ug | Freq: Once | INTRAMUSCULAR | Status: AC
  Administered 2021-07-28: 1000 ug via SUBCUTANEOUS
  Filled 2021-07-28: qty 1

## 2021-07-28 NOTE — Patient Instructions (Signed)
[place] [facility]  Discharge Instructions: Thank you for choosing [facility] to provide your oncology and hematology care.   If you have a lab appointment with the Cancer Center, please go directly to the Cancer Center and check in at the registration area.   Wear comfortable clothing and clothing appropriate for easy access to any Portacath or PICC line.   We strive to give you quality time with your provider. You may need to reschedule your appointment if you arrive late (15 or more minutes).  Arriving late affects you and other patients whose appointments are after yours.  Also, if you miss three or more appointments without notifying the office, you may be dismissed from the clinic at the provider's discretion.      For prescription refill requests, have your pharmacy contact our office and allow 72 hours for refills to be completed.    Today you received the following chemotherapy and/or immunotherapy agents vitamin B-12Vitamin B12 Deficiency Vitamin B12 deficiency occurs when the body does not have enough vitamin B12, which is an important vitamin. The body needs this vitamin: To make red blood cells. To make DNA. This is the genetic material inside cells. To help the nerves work properly so they can carry messages from the brain to the body. Vitamin B12 deficiency can cause various health problems, such as a low red blood cell count (anemia) or nerve damage. What are the causes? This condition may be caused by: Not eating enough foods that contain vitamin B12. Not having enough stomach acid and digestive fluids to properly absorb vitamin B12 from the food that you eat. Certain digestive system diseases that make it hard to absorb vitamin B12. These diseases include Crohn's disease, chronic pancreatitis, and cystic fibrosis. A condition in which the body does not make enough of a protein (intrinsic factor), resulting in too few red blood cells  (pernicious anemia). Having a surgery in which part of the stomach or small intestine is removed. Taking certain medicines that make it hard for the body to absorb vitamin B12. These medicines include: Heartburn medicines (antacids and proton pump inhibitors). Certain antibiotic medicines. Some medicines that are used to treat diabetes, tuberculosis, gout, or high cholesterol. What increases the risk? The following factors may make you more likely to develop a B12 deficiency: Being older than age 54. Eating a vegetarian or vegan diet, especially while you are pregnant. Eating a poor diet while you are pregnant. Taking certain medicines. Having alcoholism. What are the signs or symptoms? In some cases, there are no symptoms of this condition. If the condition leads to anemia or nerve damage, various symptoms can occur, such as: Weakness. Fatigue. Loss of appetite. Weight loss. Numbness or tingling in your hands and feet. Redness and burning of the tongue. Confusion or memory problems. Depression. Sensory problems, such as color blindness, ringing in the ears, or loss of taste. Diarrhea or constipation. Trouble walking. If anemia is severe, symptoms can include: Shortness of breath. Dizziness. Rapid heart rate (tachycardia). How is this diagnosed? This condition may be diagnosed with a blood test to measure the level of vitamin B12 in your blood. You may also have other tests, including: A group of tests that measure certain characteristics of blood cells (complete blood count, CBC). A blood test to measure intrinsic factor. A procedure where a thin tube with a camera on the end is used to look into your stomach or intestines (endoscopy). Other tests may be needed  to discover the cause of B12 deficiency. How is this treated? Treatment for this condition depends on the cause. This condition may be treated by: Changing your eating and drinking habits, such as: Eating more foods  that contain vitamin B12. Drinking less alcohol or no alcohol. Getting vitamin B12 injections. Taking vitamin B12 supplements. Your health care provider will tell you which dosage is best for you. Follow these instructions at home: Eating and drinking  Eat lots of healthy foods that contain vitamin B12, including: Meats and poultry. This includes beef, pork, chicken, [place], and organ meats, such as liver. Seafood. This includes clams, rainbow trout, salmon, tuna, and haddock. Eggs. Cereal and dairy products that are fortified. This means that vitamin B12 has been added to the food. Check the label on the package to see if the food is fortified. The items listed above may not be a complete list of recommended foods and beverages. Contact a dietitian for more information. General instructions Get any injections that are prescribed by your health care provider. Take supplements only as told by your health care provider. Follow the directions carefully. Do not drink alcohol if your health care provider tells you not to. In some cases, you may only be asked to limit alcohol use. Keep all follow-up visits as told by your health care provider. This is important. Contact a health care provider if: Your symptoms come back. Get help right away if you: Develop shortness of breath. Have a rapid heart rate. Have chest pain. Become dizzy or lose consciousness. Summary Vitamin B12 deficiency occurs when the body does not have enough vitamin B12. The main causes of vitamin B12 deficiency include dietary deficiency, digestive diseases, pernicious anemia, and having a surgery in which part of the stomach or small intestine is removed. In some cases, there are no symptoms of this condition. If the condition leads to anemia or nerve damage, various symptoms can occur, such as weakness, shortness of breath, and numbness. Treatment may include getting vitamin B12 injections or taking vitamin B12 supplements.  Eat lots of healthy foods that contain vitamin B12. This information is not intended to replace advice given to you by your health care provider. Make sure you discuss any questions you have with your health care provider. Document Revised: 03/08/2019 Document Reviewed: 05/29/2018 Elsevier Patient Education  2022 Elsevier Inc.    To help prevent nausea and vomiting after your treatment, we encourage you to take your nausea medication as directed.  BELOW ARE SYMPTOMS THAT SHOULD BE REPORTED IMMEDIATELY: *FEVER GREATER THAN 100.4 F (38 C) OR HIGHER *CHILLS OR SWEATING *NAUSEA AND VOMITING THAT IS NOT CONTROLLED WITH YOUR NAUSEA MEDICATION *UNUSUAL SHORTNESS OF BREATH *UNUSUAL BRUISING OR BLEEDING *URINARY PROBLEMS (pain or burning when urinating, or frequent urination) *BOWEL PROBLEMS (unusual diarrhea, constipation, pain near the anus) TENDERNESS IN MOUTH AND THROAT WITH OR WITHOUT PRESENCE OF ULCERS (sore throat, sores in mouth, or a toothache) UNUSUAL RASH, SWELLING OR PAIN  UNUSUAL VAGINAL DISCHARGE OR ITCHING   Items with * indicate a potential emergency and should be followed up as soon as possible or go to the Emergency Department if any problems should occur.  Please show the CHEMOTHERAPY ALERT CARD or IMMUNOTHERAPY ALERT CARD at check-in to the Emergency Department and triage nurse.  Should you have questions after your visit or need to cancel or reschedule your appointment, please contact [place] [facility]  Dept: [phone]  and follow the prompts.  Office hours are 8:00 a.m.  to 4:30 p.m. Monday - Friday. Please note that voicemails left after 4:00 p.m. may not be returned until the following business day.  We are closed weekends and major holidays. You have access to a nurse at all times for urgent questions. Please call the main number to the clinic Dept: [phone] and follow the prompts.   For any non-urgent questions, you may also contact your  provider using MyChart. We now offer e-Visits for anyone 69 and older to request care online for non-urgent symptoms. For details visit [email or web address].   Also download the MyChart app! Go to the app store, search "MyChart", open the app, select [place], and log in with your MyChart username and password.  Due to Covid, a mask is required upon entering the hospital/clinic. If you do not have a mask, one will be given to you upon arrival. For doctor visits, patients may have 1 support person aged 46 or older with them. For treatment visits, patients cannot have anyone with them due to current Covid guidelines and our immunocompromised population.

## 2021-08-04 ENCOUNTER — Inpatient Hospital Stay: Payer: BC Managed Care – PPO

## 2021-08-04 ENCOUNTER — Ambulatory Visit: Payer: BC Managed Care – PPO | Admitting: Nurse Practitioner

## 2021-08-04 ENCOUNTER — Other Ambulatory Visit: Payer: BC Managed Care – PPO

## 2021-08-04 ENCOUNTER — Inpatient Hospital Stay (HOSPITAL_BASED_OUTPATIENT_CLINIC_OR_DEPARTMENT_OTHER): Payer: BC Managed Care – PPO | Admitting: Nurse Practitioner

## 2021-08-04 ENCOUNTER — Other Ambulatory Visit: Payer: Self-pay

## 2021-08-04 ENCOUNTER — Inpatient Hospital Stay: Payer: BC Managed Care – PPO | Attending: Oncology

## 2021-08-04 ENCOUNTER — Encounter: Payer: Self-pay | Admitting: Nurse Practitioner

## 2021-08-04 ENCOUNTER — Ambulatory Visit: Payer: BC Managed Care – PPO

## 2021-08-04 VITALS — BP 160/111 | HR 110 | Temp 97.8°F | Resp 20 | Ht 63.0 in | Wt 299.4 lb

## 2021-08-04 DIAGNOSIS — I10 Essential (primary) hypertension: Secondary | ICD-10-CM | POA: Insufficient documentation

## 2021-08-04 DIAGNOSIS — E538 Deficiency of other specified B group vitamins: Secondary | ICD-10-CM | POA: Diagnosis not present

## 2021-08-04 DIAGNOSIS — E876 Hypokalemia: Secondary | ICD-10-CM | POA: Diagnosis not present

## 2021-08-04 DIAGNOSIS — Z79899 Other long term (current) drug therapy: Secondary | ICD-10-CM | POA: Insufficient documentation

## 2021-08-04 DIAGNOSIS — E119 Type 2 diabetes mellitus without complications: Secondary | ICD-10-CM | POA: Diagnosis not present

## 2021-08-04 DIAGNOSIS — D539 Nutritional anemia, unspecified: Secondary | ICD-10-CM | POA: Diagnosis not present

## 2021-08-04 LAB — CBC WITH DIFFERENTIAL (CANCER CENTER ONLY)
Abs Immature Granulocytes: 0.03 10*3/uL (ref 0.00–0.07)
Basophils Absolute: 0.1 10*3/uL (ref 0.0–0.1)
Basophils Relative: 0 %
Eosinophils Absolute: 0.1 10*3/uL (ref 0.0–0.5)
Eosinophils Relative: 1 %
HCT: 30.1 % — ABNORMAL LOW (ref 36.0–46.0)
Hemoglobin: 9.7 g/dL — ABNORMAL LOW (ref 12.0–15.0)
Immature Granulocytes: 0 %
Lymphocytes Relative: 17 %
Lymphs Abs: 2.1 10*3/uL (ref 0.7–4.0)
MCH: 31.8 pg (ref 26.0–34.0)
MCHC: 32.2 g/dL (ref 30.0–36.0)
MCV: 98.7 fL (ref 80.0–100.0)
Monocytes Absolute: 0.9 10*3/uL (ref 0.1–1.0)
Monocytes Relative: 7 %
Neutro Abs: 9.4 10*3/uL — ABNORMAL HIGH (ref 1.7–7.7)
Neutrophils Relative %: 75 %
Platelet Count: 550 10*3/uL — ABNORMAL HIGH (ref 150–400)
RBC: 3.05 MIL/uL — ABNORMAL LOW (ref 3.87–5.11)
RDW: 18.7 % — ABNORMAL HIGH (ref 11.5–15.5)
WBC Count: 12.6 10*3/uL — ABNORMAL HIGH (ref 4.0–10.5)
nRBC: 0 % (ref 0.0–0.2)

## 2021-08-04 MED ORDER — CYANOCOBALAMIN 1000 MCG/ML IJ SOLN
1000.0000 ug | Freq: Once | INTRAMUSCULAR | Status: AC
  Administered 2021-08-04: 1000 ug via INTRAMUSCULAR

## 2021-08-04 MED ORDER — VITAMIN B-12 1000 MCG PO TABS: 1000.0000 ug | ORAL_TABLET | Freq: Every day | ORAL | 4 refills | Status: DC

## 2021-08-04 NOTE — Progress Notes (Signed)
BP recheck requested by Lonna Cobb, NP.  BP 159/116, HR 105 with machine.  Manual BP recheck was 148/102.  Lonna Cobb, NP made aware. Per patient, she is taking her BP meds regularly and checks BP every night.  Encouraged patient to keep BP log to show at next PCP visit.  Patient states she will see PCP next week.  Per Lonna Cobb, NP ok for patient to be discharged home.

## 2021-08-04 NOTE — Progress Notes (Signed)
  [facility] OFFICE PROGRESS NOTE   Diagnosis: B12 deficiency  INTERVAL HISTORY:   Ms. Schlender returns as scheduled.  She was recently hospitalized with syncope, found to be anemic with an elevated MCV.  B12 level returned low at 145.  B12 replacement initiated while in the hospital.  She received B12 1000 mcg subcutaneous in our office 07/28/2021.  She has not started oral B12.  She is feeling much better.  She reports less fatigue.  No shortness of breath.  Less tingling in the hands and feet.  She denies bleeding.  She has a good appetite.  Objective:  Vital signs in last 24 hours:  Blood pressure (!) 160/111, pulse (!) 110, temperature 97.8 F (36.6 C), temperature source Oral, resp. rate 20, height 5\' 3"  (1.6 m), weight 299 lb 6.4 oz (135.8 kg), SpO2 100 %.  Blood pressure was repeated manually-148/102, heart rate 105    HEENT: No thrush or ulcers. Resp: Lungs clear bilaterally. Cardio: Regular rate and rhythm. GI: Abdomen soft and nontender. Vascular: No leg edema.   Lab Results:  Lab Results  Component Value Date   WBC 12.6 (H) 08/04/2021   HGB 9.7 (L) 08/04/2021   HCT 30.1 (L) 08/04/2021   MCV 98.7 08/04/2021   PLT 550 (H) 08/04/2021   NEUTROABS 9.4 (H) 08/04/2021    Imaging:  No results found.  Medications: I have reviewed the patient's current medications.  Assessment/Plan: 1.  Macrocytic anemia 2.  Vitamin B12 deficiency 3.  Folate deficiency 4.  Mildly elevated indirect bilirubin 5.  Syncope 6.  Hypokalemia 7.  Hypocalcemia 8.  Hypomagnesemia 9.  Hypertension 10.  Diabetes mellitus 11.  Alcohol use  Disposition: Norma Ramsey appears stable.  She was recently found to have a severe macrocytic anemia felt to be due to B12 deficiency.  B12 replacement has been initiated.  She will receive a B12 injection today and again in 1 week with a CBC.  She will begin oral B12 1000 mcg daily when she is able to obtain.    Review of the CBC from  today shows improvement in the hemoglobin, red cells now normocytic.  She will continue folic acid.  Blood pressure is elevated in the office today.  She reports normal blood pressure readings at home.  She will follow-up with PCP as scheduled next week.  She will return for lab and follow-up in approximately 6 weeks.  We are available to see her sooner if needed.   [name] ANP/GNP-BC   08/04/2021  3:27 PM

## 2021-08-04 NOTE — Patient Instructions (Signed)

## 2021-08-09 ENCOUNTER — Ambulatory Visit: Payer: BC Managed Care – PPO | Admitting: Family Medicine

## 2021-08-13 ENCOUNTER — Other Ambulatory Visit: Payer: Self-pay

## 2021-08-13 ENCOUNTER — Inpatient Hospital Stay: Payer: BC Managed Care – PPO

## 2021-08-13 DIAGNOSIS — E538 Deficiency of other specified B group vitamins: Secondary | ICD-10-CM

## 2021-08-13 DIAGNOSIS — E119 Type 2 diabetes mellitus without complications: Secondary | ICD-10-CM | POA: Diagnosis not present

## 2021-08-13 DIAGNOSIS — E876 Hypokalemia: Secondary | ICD-10-CM | POA: Diagnosis not present

## 2021-08-13 DIAGNOSIS — I10 Essential (primary) hypertension: Secondary | ICD-10-CM | POA: Diagnosis not present

## 2021-08-13 DIAGNOSIS — Z79899 Other long term (current) drug therapy: Secondary | ICD-10-CM | POA: Diagnosis not present

## 2021-08-13 DIAGNOSIS — D539 Nutritional anemia, unspecified: Secondary | ICD-10-CM | POA: Diagnosis not present

## 2021-08-13 LAB — CBC WITH DIFFERENTIAL (CANCER CENTER ONLY)
Abs Immature Granulocytes: 0.03 10*3/uL (ref 0.00–0.07)
Basophils Absolute: 0.1 10*3/uL (ref 0.0–0.1)
Basophils Relative: 0 %
Eosinophils Absolute: 0.1 10*3/uL (ref 0.0–0.5)
Eosinophils Relative: 1 %
HCT: 34.9 % — ABNORMAL LOW (ref 36.0–46.0)
Hemoglobin: 11.3 g/dL — ABNORMAL LOW (ref 12.0–15.0)
Immature Granulocytes: 0 %
Lymphocytes Relative: 19 %
Lymphs Abs: 2.8 10*3/uL (ref 0.7–4.0)
MCH: 30.1 pg (ref 26.0–34.0)
MCHC: 32.4 g/dL (ref 30.0–36.0)
MCV: 92.8 fL (ref 80.0–100.0)
Monocytes Absolute: 1 10*3/uL (ref 0.1–1.0)
Monocytes Relative: 7 %
Neutro Abs: 10.3 10*3/uL — ABNORMAL HIGH (ref 1.7–7.7)
Neutrophils Relative %: 73 %
Platelet Count: 437 10*3/uL — ABNORMAL HIGH (ref 150–400)
RBC: 3.76 MIL/uL — ABNORMAL LOW (ref 3.87–5.11)
RDW: 17.4 % — ABNORMAL HIGH (ref 11.5–15.5)
WBC Count: 14.3 10*3/uL — ABNORMAL HIGH (ref 4.0–10.5)
nRBC: 0 % (ref 0.0–0.2)

## 2021-08-13 MED ORDER — CYANOCOBALAMIN 1000 MCG/ML IJ SOLN
1000.0000 ug | Freq: Once | INTRAMUSCULAR | Status: AC
  Administered 2021-08-13: 1000 ug via SUBCUTANEOUS
  Filled 2021-08-13: qty 1

## 2021-08-13 NOTE — Patient Instructions (Signed)
Vitamin B12 Deficiency Vitamin B12 deficiency means that your body does not have enough vitamin B12. The body needs this important vitamin: To make red blood cells. To make genes (DNA). To help the nerves work. If you do not have enough vitamin B12 in your body, you can have health problems, such as not having enough red blood cells in the blood (anemia). What are the causes? Not eating enough foods that contain vitamin B12. Not being able to take in (absorb) vitamin B12 from the food that you eat. Certain diseases. A condition in which the body does not make enough of a certain protein. This results in your body not taking in enough vitamin B12. Having a surgery in which part of the stomach or small intestine is taken out. Taking medicines that make it hard for the body to take in vitamin B12. These include: Heartburn medicines. Some medicines that are used to treat diabetes. What increases the risk? Being an older adult. Eating a vegetarian or vegan diet that does not include any foods that come from animals. Not eating enough foods that contain vitamin B12 while you are pregnant. Taking certain medicines. Having alcoholism. What are the signs or symptoms? In some cases, there are no symptoms. If the condition leads to too few blood cells or nerve damage, symptoms can occur, such as: Feeling weak or tired. Not being hungry. Losing feeling (numbness) or tingling in your hands and feet. Redness and burning of the tongue. Feeling sad (depressed). Confusion or memory problems. Trouble walking. If anemia is very bad, symptoms can include: Being short of breath. Being dizzy. Having a very fast heartbeat. How is this treated? Changing the way you eat and drink, such as: Eating more foods that contain vitamin B12. Drinking little or no alcohol. Getting vitamin B12 shots. Taking vitamin B12 supplements by mouth (orally). Your doctor will tell you the dose that is best for you. Follow  these instructions at home: Eating and drinking  Eat foods that come from animals and have a lot of vitamin B12 in them. These include: Meats and poultry. This includes beef, pork, chicken, turkey, and organ meats, such as liver. Seafood, such as clams, rainbow trout, salmon, tuna, and haddock. Eggs. Dairy foods such as milk, yogurt, and cheese. Eat breakfast cereals that have vitamin B12 added to them (are fortified). Check the label. The items listed above may not be a complete list of foods and beverages you can eat and drink. Contact a dietitian for more information. Alcohol use Do not drink alcohol if: Your doctor tells you not to drink. You are pregnant, may be pregnant, or are planning to become pregnant. If you drink alcohol: Limit how much you have to: 0-1 drink a day for women. 0-2 drinks a day for men. Know how much alcohol is in your drink. In the [place] one drink equals one 12 oz bottle of beer (355 mL), one 5 oz glass of wine (148 mL), or one 1 oz glass of hard liquor (44 mL). General instructions Get any vitamin B12 shots if told by your doctor. Take supplements only as told by your doctor. Follow the directions. Keep all follow-up visits. Contact a doctor if: Your symptoms come back. Your symptoms get worse or do not get better with treatment. Get help right away if: You have trouble breathing. You have a very fast heartbeat. You have chest pain. You get dizzy. You faint. These symptoms may be an emergency. Get help right away. Call 911.   Do not wait to see if the symptoms will go away. Do not drive yourself to the hospital. Summary Vitamin B12 deficiency means that your body is not getting enough of the vitamin. In some cases, there are no symptoms of this condition. Treatment may include making a change in the way you eat and drink, getting shots, or taking supplements. Eat foods that have vitamin B12 in them. This information is not intended to replace advice  given to you by your health care provider. Make sure you discuss any questions you have with your health care provider. Document Revised: 05/14/2021 Document Reviewed: 05/14/2021 Elsevier Patient Education  2022 Elsevier Inc.  

## 2021-08-20 ENCOUNTER — Encounter: Payer: Self-pay | Admitting: Student

## 2021-08-20 ENCOUNTER — Ambulatory Visit (INDEPENDENT_AMBULATORY_CARE_PROVIDER_SITE_OTHER): Payer: BC Managed Care – PPO | Admitting: Student

## 2021-08-20 ENCOUNTER — Other Ambulatory Visit: Payer: Self-pay

## 2021-08-20 ENCOUNTER — Ambulatory Visit (INDEPENDENT_AMBULATORY_CARE_PROVIDER_SITE_OTHER): Payer: BC Managed Care – PPO

## 2021-08-20 VITALS — BP 179/121 | HR 101 | Ht 63.0 in | Wt 297.8 lb

## 2021-08-20 DIAGNOSIS — Z23 Encounter for immunization: Secondary | ICD-10-CM | POA: Diagnosis not present

## 2021-08-20 DIAGNOSIS — E119 Type 2 diabetes mellitus without complications: Secondary | ICD-10-CM | POA: Diagnosis not present

## 2021-08-20 DIAGNOSIS — I10 Essential (primary) hypertension: Secondary | ICD-10-CM

## 2021-08-20 DIAGNOSIS — E538 Deficiency of other specified B group vitamins: Secondary | ICD-10-CM | POA: Diagnosis not present

## 2021-08-20 MED ORDER — TRULICITY 0.75 MG/0.5ML ~~LOC~~ SOAJ: 0.7500 mg | SUBCUTANEOUS | 0 refills | Status: DC

## 2021-08-20 NOTE — Patient Instructions (Addendum)
It was a pleasure to see you today! Thank you for choosing [facility] for your primary care.   Our plans for today were: Start trulicity, rx given  Please make a follow up with our pharmacist Dr. Raymondo Band for 24 hour blood pressure monitoring  Please schedule appt with Dr. Nicholaus Bloom the pharmacist for increasing trulicity dosage  Please see Dr. Manson Passey in 1-2 months for follow up  Continue with hematology as well     You should return to our clinic to see [place] in 1-2 months   Best,  Alfredo Martinez

## 2021-08-20 NOTE — Assessment & Plan Note (Signed)
Patient has been taking a B12 supplement for her deficiency and is seeing hematology.  Follow-up with hematology as recommended.

## 2021-08-20 NOTE — Assessment & Plan Note (Signed)
Patient's blood pressure has been very elevated over the past couple of weeks.  We checked it twice today, and the patient has been asymptomatic.  She regularly takes her amlodipine 10 mg.  We discussed the option of 24-hour blood pressure monitoring with Dr. Raymondo Band.  I have instructed the patient to have a follow-up with Dr. Raymondo Band for this in 1 week.  If blood pressure remains persistently elevated we can consider adding losartan 25 mg to patient's blood pressure regimen and/or further evaluation for secondary causes of her hypertension.  She previously had multiple labs done on last admission to the hospital.  Currently there is no need for new labs.

## 2021-08-20 NOTE — Progress Notes (Signed)
SUBJECTIVE:   CHIEF COMPLAINT / HPI:   Diabetes Mellitus Type II, Follow-up  Lab Results  Component Value Date   HGBA1C 7.8 (H) 07/19/2021   HGBA1C 6.2 06/03/2020   HGBA1C 5.8 12/02/2019   Wt Readings from Last 3 Encounters:  08/20/21 297 lb 12.8 oz (135.1 kg)  08/04/21 299 lb 6.4 oz (135.8 kg)  07/23/21 299 lb (135.6 kg)   Last seen for diabetes 1 months ago.  Management since then includes Metformin 500 mg once daily. She takes it twice daily on the weekends.  She reports fair compliance with treatment. She is not having side effects.  Symptoms: No fatigue No foot ulcerations  No appetite changes No nausea  Yes paresthesia of the feet - B12 deficiency  No polydipsia  No polyuria No visual disturbances   No vomiting     Home blood sugar records: does not check   Episodes of hypoglycemia? No    Most Recent Eye Exam: N/A Current exercise: none Current diet habits: diabetic, no fried foods, decreasing carb use, no chips  Pertinent Labs: Lab Results  Component Value Date   CHOL 263 (H) 12/20/2019   HDL 58 12/20/2019   LDLCALC 145 (H) 12/20/2019   TRIG 331 (H) 12/20/2019   CHOLHDL 4.5 (H) 12/20/2019   Lab Results  Component Value Date   NA 138 07/23/2021   K 3.7 07/23/2021   CREATININE 0.48 (L) 07/23/2021   EGFR 127 07/23/2021     ---------------------------------------------------------------------------------------------------  HTN: Amlodipine 10 mg. She has not taken her medication today. BP has been elevated the last couple of weeks   Alcohol Use: Glass of wine each day in a week, couple shots on weekend.   PERTINENT  PMH / PSH: HTN  OBJECTIVE:   BP (!) 179/121   Pulse (!) 101   Ht 5\' 3"  (1.6 m)   Wt 297 lb 12.8 oz (135.1 kg)   SpO2 100%   BMI 52.75 kg/m   Physical Exam Constitutional:      General: She is not in acute distress.    Appearance: Normal appearance. She is not toxic-appearing.  Cardiovascular:     Rate and Rhythm: Normal  rate and regular rhythm.  Pulmonary:     Effort: Pulmonary effort is normal.     Breath sounds: Normal breath sounds.  Abdominal:     General: Abdomen is flat. Bowel sounds are normal. There is no distension.     Tenderness: no abdominal tenderness  Skin:    General: Skin is warm.  Neurological:     Mental Status: She is alert.  Psychiatric:        Mood and Affect: Mood normal.        Behavior: Behavior normal.    ASSESSMENT/PLAN:   Essential hypertension Patient's blood pressure has been very elevated over the past couple of weeks.  We checked it twice today, and the patient has been asymptomatic.  She regularly takes her amlodipine 10 mg.  We discussed the option of 24-hour blood pressure monitoring with Dr. .  I have instructed the patient to have a follow-up with Dr. Raymondo Band for this in 1 week.  If blood pressure remains persistently elevated we can consider adding losartan 25 mg to patient's blood pressure regimen and/or further evaluation for secondary causes of her hypertension.  She previously had multiple labs done on last admission to the hospital.  Currently there is no need for new labs.  B12 deficiency Patient has been taking a  B12 supplement for her deficiency and is seeing hematology.  Follow-up with hematology as recommended.  Diabetes mellitus without complication ([place]) Most recent A1c of 7.4 in October 2022.  Currently she is taking metformin, and I have instructed her to take it twice daily 500 mg as previously instructed.  We will also start the patient on Trulicity starting at the lowest dose.  I will have her follow-up with Dr. Georgina Peer to titrate her Trulicity doses.  Yearly requirements for new diabetic include eye exam, foot exam, urine microalbumin.     Erskine Emery, MD [place]

## 2021-08-20 NOTE — Assessment & Plan Note (Signed)
Most recent A1c of 7.4 in October 2022.  Currently she is taking metformin, and I have instructed her to take it twice daily 500 mg as previously instructed.  We will also start the patient on Trulicity starting at the lowest dose.  I will have her follow-up with Dr. Nicholaus Bloom to titrate her Trulicity doses.  Yearly requirements for new diabetic include eye exam, foot exam, urine microalbumin.

## 2021-09-02 ENCOUNTER — Ambulatory Visit: Payer: BC Managed Care – PPO | Admitting: Pharmacist

## 2021-09-03 ENCOUNTER — Ambulatory Visit: Payer: BC Managed Care – PPO | Admitting: Pharmacist

## 2021-09-06 ENCOUNTER — Other Ambulatory Visit: Payer: Self-pay

## 2021-09-06 ENCOUNTER — Ambulatory Visit (INDEPENDENT_AMBULATORY_CARE_PROVIDER_SITE_OTHER): Payer: BC Managed Care – PPO | Admitting: Pharmacist

## 2021-09-06 ENCOUNTER — Encounter: Payer: Self-pay | Admitting: Pharmacist

## 2021-09-06 DIAGNOSIS — I10 Essential (primary) hypertension: Secondary | ICD-10-CM | POA: Diagnosis not present

## 2021-09-06 MED ORDER — METFORMIN HCL 500 MG PO TABS: 500.0000 mg | ORAL_TABLET | Freq: Two times a day (BID) | ORAL | 6 refills | Status: DC

## 2021-09-06 NOTE — Progress Notes (Signed)
Reviewed: I agree with Dr. Koval's documentation and management. 

## 2021-09-06 NOTE — Assessment & Plan Note (Signed)
History of hypertension for ~ 10 years.  Currently taking amlodipine 10mg .   24-hour ambulatory blood pressure placed and patient was educated on plan for use for return.

## 2021-09-06 NOTE — Patient Instructions (Signed)
Blood Pressure Activity Diary Time Lying down/ Sleeping Walking/ Exercise Stressed/ Angry Headache/ Pain Dizzy  9 AM       10 AM       11 AM       12 PM       1 PM       2 PM       Time Lying down/ Sleeping Walking/ Exercise Stressed/ Angry Headache/ Pain Dizzy  3 PM       4 PM        5 PM       6 PM       7 PM       8 PM       Time Lying down/ Sleeping Walking/ Exercise Stressed/ Angry Headache/ Pain Dizzy  9 PM       10 PM       11 PM       12 AM       1 AM       2 AM       3 AM       Time Lying down/ Sleeping Walking/ Exercise Stressed/ Angry Headache/ Pain Dizzy  4 AM       5 AM       6 AM       7 AM       8 AM       9 AM       10 AM        Time you woke up: _________                  Time you went to sleep:__________  Come back tomorrow at 8:30 to have the monitor removed Call the [facility] if you have any questions before then ([phone])  Wearing the Blood Pressure Monitor The cuff will inflate every 20 minutes during the day and every 30 minutes while you sleep. Your blood pressure readings will NOT display after cuff inflation Fill out the blood pressure-activity diary during the day, especially during activities that may affect your reading -- such as exercise, stress, walking, taking your blood pressure medications  Important things to know: Avoid taking the monitor off for the next 24 hours, unless it causes you discomfort or pain. Do NOT get the monitor wet and do NOT dry to clean the monitor with any cleaning products. Do NOT put the monitor on anyone else's arm. When the cuff inflates, avoid excess movement. Let the cuffed arm hang loosely, slightly away from the body. Avoid flexing the muscles or moving the hand/fingers. When you go to sleep, make sure that the hose is not kinked. Remember to fill out the blood pressure activity diary. If you experience severe pain or unusual pain (not associated with getting your blood pressure  checked), remove the monitor.  Troubleshooting:  Code  Troubleshooting   1  Check cuff position, tighten cuff   2, 3  Remain still during reading   4, 87  Check air hose connections and make sure cuff is tight   85, 89  Check hose connections and make tubing is not crimped   86  Push START/STOP to restart reading   88, 91  Retry by pushing START/STOP   90  Replace batteries. If problem persists, remove monitor and bring back to   clinic at follow up   97, 98, 99  Service required - Remove monitor and bring back to clinic at   follow up    

## 2021-09-06 NOTE — Progress Notes (Signed)
   S:    Patient arrives in good spirits, ambulating without assistance.  Presents to the clinic for ambulatory blood pressure evaluation.   Patient was referred and last seen by Dr. Laury Deep 08/20/2021.  Last seen by Dr. Manson Passey, 12/02/2019  Diagnosed with Hypertension many years ago (possibly as long as 10 years ago per patient report).    Medication compliance is reported to be good recently.  Denies missing medication with any regularity.  Discussed procedure for wearing the monitor and gave patient written instructions. Monitor was placed on non-dominant arm with instructions to return in the morning.   Current BP Medications include:  amlodipine 10mg  once daily in the evening.  Patient reports strong family history of hypertension in both parents.  Denies either sibling has blood pressure issues.   Reports two hospitalizations for very high blood pressure readings.  Expressed concern over MACE including sudden death, heart attack and stroke.   O:  Physical Exam Constitutional:      Appearance: Normal appearance. She is obese.  Pulmonary:     Effort: Pulmonary effort is normal.  Neurological:     Mental Status: She is alert.  Psychiatric:        Mood and Affect: Mood normal.        Behavior: Behavior normal.        Thought Content: Thought content normal.        Judgment: Judgment normal.    Review of Systems  Constitutional:  Positive for malaise/fatigue.  All other systems reviewed and are negative.  Last 3 Office BP readings: BP Readings from Last 3 Encounters:  08/20/21 (!) 179/121  08/13/21 (!) 142/98  08/04/21 (!) 148/102    Basic Metabolic Panel    Component Value Date/Time   NA 138 07/23/2021 1436   K 3.7 07/23/2021 1436   CL 101 07/23/2021 1436   CO2 22 07/23/2021 1436   GLUCOSE 132 (H) 07/23/2021 1436   GLUCOSE 142 (H) 07/21/2021 0502   BUN 5 (L) 07/23/2021 1436   CREATININE 0.48 (L) 07/23/2021 1436   CALCIUM 8.3 (L) 07/23/2021 1436   GFRNONAA >60  07/21/2021 0502   GFRAA 143 12/20/2019 1634    ABPM Study Data: Arm Placement left arm   A/P: History of hypertension for ~ 10 years.  Currently taking amlodipine 10mg .   24-hour ambulatory blood pressure placed and patient was educated on plan for use for return. Total time in face-to-face counseling 14 minutes.   F/U Clinic Visit 09/07/2021 at 8:30 AM.

## 2021-09-07 ENCOUNTER — Ambulatory Visit (INDEPENDENT_AMBULATORY_CARE_PROVIDER_SITE_OTHER): Payer: BC Managed Care – PPO | Admitting: Pharmacist

## 2021-09-07 ENCOUNTER — Encounter: Payer: Self-pay | Admitting: Pharmacist

## 2021-09-07 ENCOUNTER — Other Ambulatory Visit: Payer: Self-pay

## 2021-09-07 DIAGNOSIS — I10 Essential (primary) hypertension: Secondary | ICD-10-CM

## 2021-09-07 DIAGNOSIS — E119 Type 2 diabetes mellitus without complications: Secondary | ICD-10-CM

## 2021-09-07 MED ORDER — TRULICITY 0.75 MG/0.5ML ~~LOC~~ SOAJ: 0.7500 mg | SUBCUTANEOUS | 0 refills | Status: DC

## 2021-09-07 MED ORDER — OLMESARTAN MEDOXOMIL 20 MG PO TABS: 20.0000 mg | ORAL_TABLET | Freq: Every day | ORAL | 1 refills | Status: DC

## 2021-09-07 NOTE — Assessment & Plan Note (Signed)
History of hypertension which is uncontrolled based on 24-hour ambulatory blood pressure evaluation which had an average AWAKE blood pressure of 176/117 mmHg, and a nocturnal dipping pattern that is profound and normal.   - Continue Amlodipine 10mg  daily - Start Olmesartan 20mg  daily. Patient educated on purpose, proper use and potential adverse effects including angioedema. Following instruction patient verbalized understanding of treatment plan.  Plan reviewed with Dr. .  - Consider dose increase versus addition of third agent at next follow-up.

## 2021-09-07 NOTE — Assessment & Plan Note (Signed)
Diabetes with recent A1c of 7.8 and prescribed Trulicity (dulglutide).  Patient has been unable to afford but is planning to purchase in the near future. Medication Samples have been provided to the patient. Drug name: Trulicity (dulglutide)       Strength: 0.75mg         Qty: 2 pens  LOT: [id number]  Exp.Date: 11/05/2022 Dosing instructions: once weekly. Patient educated on purpose, proper use and potential adverse effects of nausea/vomiting.  Following instruction patient verbalized understanding of treatment plan.  - Patient believe she can afford medication in the next two weeks.  - Consider dose Trulicity increase to 1.5mg  once weekly at visit in January.

## 2021-09-07 NOTE — Progress Notes (Signed)
   S:    Patient to clinic with ambulatory BP monitor on arm.   Patient reports that she slept fine.   Diagnosed with Hypertension ~10 years ago.  Medication compliance is reported to be good with amlodpine.   Patient reported not issues with Amb BP monitor.     O:  Physical Exam Constitutional:      Appearance: She is obese.  Pulmonary:     Effort: Pulmonary effort is normal.  Neurological:     Mental Status: She is alert.  Psychiatric:        Mood and Affect: Mood normal.        Behavior: Behavior normal.        Thought Content: Thought content normal.        Judgment: Judgment normal.    Review of Systems  All other systems reviewed and are negative.  Last 3 Office BP readings: BP Readings from Last 3 Encounters:  09/06/21 (!) 177/133  08/20/21 (!) 179/121  08/13/21 (!) 142/98    Basic Metabolic Panel    Component Value Date/Time   NA 138 07/23/2021 1436   K 3.7 07/23/2021 1436   CL 101 07/23/2021 1436   CO2 22 07/23/2021 1436   GLUCOSE 132 (H) 07/23/2021 1436   GLUCOSE 142 (H) 07/21/2021 0502   BUN 5 (L) 07/23/2021 1436   CREATININE 0.48 (L) 07/23/2021 1436   CALCIUM 8.3 (L) 07/23/2021 1436   GFRNONAA >60 07/21/2021 0502   GFRAA 143 12/20/2019 1634     ABPM Study Data: Arm Placement left arm  Overall Mean 24hr BP:   164/106 mmHg HR: 106  Daytime Mean BP:  176/117 mmHg HR: 110  Nighttime Mean BP:  129/75 mmHg  HR: 99  Dipping Pattern: Yes.    Sys:   27%   Dia: 35.9%   [normal dipping ~10-20%]   A/P: History of hypertension which is uncontrolled based on 24-hour ambulatory blood pressure evaluation which had an average AWAKE blood pressure of 176/117 mmHg, and a nocturnal dipping pattern that is profound and normal.   - Continue Amlodipine 10mg  daily - Start Olmesartan 20mg  daily. Patient educated on purpose, proper use and potential adverse effects including angioedema. Following instruction patient verbalized understanding of treatment plan.   Plan reviewed with Dr. .  - Consider dose increase versus addition of third agent at next follow-up.   Diabetes with recent A1c of 7.8 and prescribed Trulicity (dulglutide).  Patient has been unable to afford but is planning to purchase in the near future. Medication Samples have been provided to the patient. Drug name: Trulicity (dulglutide)       Strength: 0.75mg         Qty: 2 pens  LOT: A  Exp.Date: 11/05/2022 Dosing instructions: once weekly. Patient educated on purpose, proper use and potential adverse effects of nausea/vomiting.  Following instruction patient verbalized understanding of treatment plan.  - Patient believe she can afford medication in the next two weeks.  - Consider dose Trulicity increase to 1.5mg  once weekly at visit in January.    Results reviewed and written information provided.  Total time in face-to-face counseling 24 minutes.   F/U Clinic Visit with Dr. 01/04/2023.  Refer back to pharmacy clinic for hypertension, diabetes or tobacco cessation support - PRN.

## 2021-09-07 NOTE — Patient Instructions (Addendum)
Nice to see you today!  Blood Pressure Test showed elevated readings during the day.   Continue amlodipine 10mg  daily Start Olmesartan 20mg  once daily.   Start Trulicity 0.75mg  once weekly.   Folllow-up with Dr. in January

## 2021-09-07 NOTE — Progress Notes (Signed)
Reviewed: I agree with Dr. Koval's documentation and management. 

## 2021-09-17 ENCOUNTER — Inpatient Hospital Stay: Payer: BC Managed Care – PPO | Attending: Nurse Practitioner | Admitting: Nurse Practitioner

## 2021-09-17 ENCOUNTER — Other Ambulatory Visit: Payer: Self-pay

## 2021-09-17 ENCOUNTER — Encounter: Payer: Self-pay | Admitting: Nurse Practitioner

## 2021-09-17 ENCOUNTER — Inpatient Hospital Stay: Payer: BC Managed Care – PPO

## 2021-09-17 VITALS — BP 174/120 | HR 85 | Temp 97.8°F | Resp 18 | Wt 301.0 lb

## 2021-09-17 DIAGNOSIS — Z79899 Other long term (current) drug therapy: Secondary | ICD-10-CM | POA: Diagnosis not present

## 2021-09-17 DIAGNOSIS — D539 Nutritional anemia, unspecified: Secondary | ICD-10-CM | POA: Insufficient documentation

## 2021-09-17 DIAGNOSIS — E876 Hypokalemia: Secondary | ICD-10-CM | POA: Insufficient documentation

## 2021-09-17 DIAGNOSIS — I10 Essential (primary) hypertension: Secondary | ICD-10-CM | POA: Diagnosis not present

## 2021-09-17 DIAGNOSIS — E538 Deficiency of other specified B group vitamins: Secondary | ICD-10-CM | POA: Insufficient documentation

## 2021-09-17 DIAGNOSIS — E119 Type 2 diabetes mellitus without complications: Secondary | ICD-10-CM | POA: Diagnosis not present

## 2021-09-17 LAB — CBC WITH DIFFERENTIAL (CANCER CENTER ONLY)
Abs Immature Granulocytes: 0.02 10*3/uL (ref 0.00–0.07)
Basophils Absolute: 0 10*3/uL (ref 0.0–0.1)
Basophils Relative: 0 %
Eosinophils Absolute: 0 10*3/uL (ref 0.0–0.5)
Eosinophils Relative: 0 %
HCT: 40.1 % (ref 36.0–46.0)
Hemoglobin: 13.2 g/dL (ref 12.0–15.0)
Immature Granulocytes: 0 %
Lymphocytes Relative: 22 %
Lymphs Abs: 3 10*3/uL (ref 0.7–4.0)
MCH: 28.3 pg (ref 26.0–34.0)
MCHC: 32.9 g/dL (ref 30.0–36.0)
MCV: 86.1 fL (ref 80.0–100.0)
Monocytes Absolute: 0.9 10*3/uL (ref 0.1–1.0)
Monocytes Relative: 7 %
Neutro Abs: 9.7 10*3/uL — ABNORMAL HIGH (ref 1.7–7.7)
Neutrophils Relative %: 71 %
Platelet Count: 389 10*3/uL (ref 150–400)
RBC: 4.66 MIL/uL (ref 3.87–5.11)
RDW: 14.6 % (ref 11.5–15.5)
WBC Count: 13.7 10*3/uL — ABNORMAL HIGH (ref 4.0–10.5)
nRBC: 0 % (ref 0.0–0.2)

## 2021-09-17 LAB — CMP (CANCER CENTER ONLY)
ALT: 25 U/L (ref 0–44)
AST: 34 U/L (ref 15–41)
Albumin: 4.3 g/dL (ref 3.5–5.0)
Alkaline Phosphatase: 115 U/L (ref 38–126)
Anion gap: 12 (ref 5–15)
BUN: 6 mg/dL (ref 6–20)
CO2: 26 mmol/L (ref 22–32)
Calcium: 8.8 mg/dL — ABNORMAL LOW (ref 8.9–10.3)
Chloride: 97 mmol/L — ABNORMAL LOW (ref 98–111)
Creatinine: 0.56 mg/dL (ref 0.44–1.00)
GFR, Estimated: >60 mL/min
Glucose, Bld: 117 mg/dL — ABNORMAL HIGH (ref 70–99)
Potassium: 3 mmol/L — ABNORMAL LOW (ref 3.5–5.1)
Sodium: 135 mmol/L (ref 135–145)
Total Bilirubin: 0.6 mg/dL (ref 0.3–1.2)
Total Protein: 7.8 g/dL (ref 6.5–8.1)

## 2021-09-17 LAB — LACTATE DEHYDROGENASE: LDH: 181 U/L (ref 98–192)

## 2021-09-17 NOTE — Progress Notes (Signed)
°  Norma Ramsey OFFICE PROGRESS NOTE   Diagnosis: B12 deficiency  INTERVAL HISTORY:   Norma Ramsey returns as scheduled.  She continues oral B12.  She is feeling better.  She was prescribed a new blood pressure medication about 10 days ago.  She has been unable to get it from the pharmacy due to the cost.  She plans to pick it up today.  She denies headaches.  No shortness of breath.  No chest pain. Objective:  Vital signs in last 24 hours:  Blood pressure (!) 174/120, pulse 85, temperature 97.8 F (36.6 C), temperature source Tympanic, resp. rate 18, weight (!) 301 lb (136.5 kg), SpO2 100 %.    Resp: Lungs clear bilaterally. Cardio: Regular rate and rhythm. GI: Abdomen soft and nontender.  No hepatosplenomegaly. Vascular: No leg edema.   Lab Results:  Lab Results  Component Value Date   WBC 13.7 (H) 09/17/2021   HGB 13.2 09/17/2021   HCT 40.1 09/17/2021   MCV 86.1 09/17/2021   PLT 389 09/17/2021   NEUTROABS 9.7 (H) 09/17/2021    Imaging:  No results found.  Medications: I have reviewed the patient's current medications.  Assessment/Plan: 1.  Macrocytic anemia 2.  Vitamin B12 deficiency 3.  Folate deficiency 4.  Mildly elevated indirect bilirubin 5.  Syncope 6.  Hypokalemia 7.  Hypocalcemia 8.  Hypomagnesemia 9.  Hypertension 10.  Diabetes mellitus 11.  Alcohol use  Disposition: Norma Ramsey appears stable.  The hemoglobin and LDH have corrected into normal range.  She will continue oral B12.  The total white count is elevated.  This may be due to smoking.  We discussed smoking cessation.  Blood pressure is markedly elevated in the office today.  She has not started her new blood pressure medication.  She plans on beginning it today.  She will follow-up with her PCP.    She has hypokalemia.  We will forward to her PCP for management.  She will return for lab and follow-up in 6 months.  She will contact the office in the interim with any  problems.  Plan reviewed with Dr. Truett Perna.  Lonna Cobb ANP/GNP-BC   09/17/2021  4:10 PM

## 2021-09-18 ENCOUNTER — Telehealth: Payer: Self-pay | Admitting: Family Medicine

## 2021-09-18 LAB — HAPTOGLOBIN: Haptoglobin: 190 mg/dL (ref 33–278)

## 2021-09-18 NOTE — Telephone Encounter (Signed)
Attempted to call patient about potassium of 3. Will attempt to call again on Monday.   Terisa Starr, MD  Family [facility] Teaching Service

## 2021-09-22 ENCOUNTER — Telehealth: Payer: Self-pay | Admitting: Family Medicine

## 2021-09-22 NOTE — Telephone Encounter (Signed)
Attempted to call re: potassium. Left generic voicemail to call back. If calls back, please identify good time for patient to discuss potassium.  Terisa Starr, MD  Family Medicine Teaching Service

## 2021-09-23 ENCOUNTER — Telehealth: Payer: Self-pay | Admitting: Family Medicine

## 2021-09-23 NOTE — Telephone Encounter (Signed)
Called patient about potassium. She reports she is at work, will message on Mychart.  Terisa Starr, MD  Family Medicine Teaching Service

## 2021-10-25 ENCOUNTER — Telehealth: Payer: Self-pay

## 2021-10-25 ENCOUNTER — Other Ambulatory Visit (HOSPITAL_COMMUNITY): Payer: Self-pay

## 2021-10-25 NOTE — Telephone Encounter (Signed)
A Prior Authorization was initiated for this patients TRULICITY 0.75MG  through CoverMyMeds.   Key: [id number]  Attached chart notes from 08/20/21

## 2021-10-27 ENCOUNTER — Other Ambulatory Visit: Payer: Self-pay | Admitting: Student

## 2021-10-27 DIAGNOSIS — E119 Type 2 diabetes mellitus without complications: Secondary | ICD-10-CM

## 2021-10-28 ENCOUNTER — Other Ambulatory Visit (HOSPITAL_COMMUNITY): Payer: Self-pay

## 2021-10-28 NOTE — Telephone Encounter (Signed)
Prior Auth for patients medication TRULICITY 0.75MG  approved by [employer] from 10/25/21 to 10/25/22.   CoverMyMeds Key: [id number]

## 2021-11-26 ENCOUNTER — Encounter: Payer: BC Managed Care – PPO | Admitting: Family Medicine

## 2021-12-31 ENCOUNTER — Ambulatory Visit: Payer: BC Managed Care – PPO | Admitting: Family Medicine

## 2022-01-10 ENCOUNTER — Ambulatory Visit (INDEPENDENT_AMBULATORY_CARE_PROVIDER_SITE_OTHER): Payer: BC Managed Care – PPO | Admitting: Obstetrics and Gynecology

## 2022-01-10 ENCOUNTER — Encounter (HOSPITAL_BASED_OUTPATIENT_CLINIC_OR_DEPARTMENT_OTHER): Payer: Self-pay | Admitting: Obstetrics and Gynecology

## 2022-01-10 ENCOUNTER — Encounter: Payer: Self-pay | Admitting: Obstetrics and Gynecology

## 2022-01-10 ENCOUNTER — Emergency Department (HOSPITAL_BASED_OUTPATIENT_CLINIC_OR_DEPARTMENT_OTHER)
Admission: EM | Admit: 2022-01-10 | Discharge: 2022-01-10 | Disposition: A | Discharge status: Home / Self Care | Payer: BC Managed Care – PPO | Attending: Emergency Medicine | Admitting: Emergency Medicine

## 2022-01-10 ENCOUNTER — Other Ambulatory Visit: Payer: Self-pay

## 2022-01-10 VITALS — BP 192/134 | HR 102 | Wt 287.0 lb

## 2022-01-10 DIAGNOSIS — D649 Anemia, unspecified: Secondary | ICD-10-CM | POA: Insufficient documentation

## 2022-01-10 DIAGNOSIS — Z79899 Other long term (current) drug therapy: Secondary | ICD-10-CM | POA: Diagnosis not present

## 2022-01-10 DIAGNOSIS — I161 Hypertensive emergency: Secondary | ICD-10-CM

## 2022-01-10 DIAGNOSIS — I10 Essential (primary) hypertension: Secondary | ICD-10-CM | POA: Insufficient documentation

## 2022-01-10 LAB — CBC
HCT: 42.7 % (ref 36.0–46.0)
Hemoglobin: 15 g/dL (ref 12.0–15.0)
MCH: 31.3 pg (ref 26.0–34.0)
MCHC: 35.1 g/dL (ref 30.0–36.0)
MCV: 89.1 fL (ref 80.0–100.0)
Platelets: 334 10*3/uL (ref 150–400)
RBC: 4.79 MIL/uL (ref 3.87–5.11)
RDW: 13.7 % (ref 11.5–15.5)
WBC: 16.1 10*3/uL — ABNORMAL HIGH (ref 4.0–10.5)
nRBC: 0 % (ref 0.0–0.2)

## 2022-01-10 LAB — BASIC METABOLIC PANEL
Anion gap: 14 (ref 5–15)
BUN: 8 mg/dL (ref 6–20)
CO2: 27 mmol/L (ref 22–32)
Calcium: 8.2 mg/dL — ABNORMAL LOW (ref 8.9–10.3)
Chloride: 97 mmol/L — ABNORMAL LOW (ref 98–111)
Creatinine, Ser: 0.58 mg/dL (ref 0.44–1.00)
GFR, Estimated: >60 mL/min (ref >60)
Glucose, Bld: 127 mg/dL — ABNORMAL HIGH (ref 70–99)
Potassium: 3 mmol/L — ABNORMAL LOW (ref 3.5–5.1)
Sodium: 138 mmol/L (ref 135–145)

## 2022-01-10 LAB — TROPONIN I (HIGH SENSITIVITY): Troponin I (High Sensitivity): 6 ng/L (ref <18)

## 2022-01-10 LAB — POCT PREGNANCY, URINE: Preg Test, Ur: NEGATIVE

## 2022-01-10 MED ORDER — POTASSIUM CHLORIDE CRYS ER 20 MEQ PO TBCR
40.0000 meq | EXTENDED_RELEASE_TABLET | Freq: Once | ORAL | Status: AC
  Administered 2022-01-10: 40 meq via ORAL
  Filled 2022-01-10: qty 2

## 2022-01-10 MED ORDER — AMLODIPINE BESYLATE 5 MG PO TABS
10.0000 mg | ORAL_TABLET | Freq: Once | ORAL | Status: AC
Start: 2022-01-10 — End: 2022-01-10
  Administered 2022-01-10: 10 mg via ORAL
  Filled 2022-01-10: qty 2

## 2022-01-10 MED ORDER — AMLODIPINE BESYLATE 10 MG PO TABS: 10.0000 mg | ORAL_TABLET | Freq: Every evening | ORAL | 0 refills | Status: DC

## 2022-01-10 NOTE — Discharge Instructions (Signed)
Call your primary care doctor or specialist as discussed in the next 2-3 days.   Return immediately back to the ER if:  Your symptoms worsen within the next 12-24 hours. You develop new symptoms such as new fevers, persistent vomiting, new pain, shortness of breath, or new weakness or numbness, or if you have any other concerns.  

## 2022-01-10 NOTE — ED Notes (Signed)
RN provided AVS using Teachback Method. Patient verbalizes understanding of Discharge Instructions. Opportunity for Questioning and Answers were provided by RN. Patient Discharged from ED ambulatory to Home. ° °

## 2022-01-10 NOTE — ED Triage Notes (Signed)
Patient reports to the ER for HTN. Patient reports she was at the [facility] for women and was told her BP was high and she was urged to go to the ER for evaluation.  ?

## 2022-01-10 NOTE — ED Provider Notes (Signed)
Home ?[facility] EMERGENC[facility] DEPT ?Provider Note ? ? ?CSN: [id number] ?Arrival date & time: 01/10/22  1536 ? ?  ? ?History ? ?Chief Complaint  ?Patient presents with  ? Hypertension  ? ? ?Norma Ramsey is a 36 y.o. female. ? ?Patient presents ER chief complaint of elevated blood pressure.  She states that she had elevated blood pressure in the past.  She has medications which she been taking regularly.  She went to a [facility] clinic today for follow-up for oral contraceptives.  She was told blood pressure was too high and sent to the ER.  Patient otherwise denies any pain or discomfort.  No headache no chest pain abdominal pain no fever no cough no vomiting or diarrhea. ? ? ?  ? ?Home Medications ?Prior to Admission medications   ?Medication Sig Start Date End Date Taking? Authorizing Provider  ?amLODipine (NORVASC) 10 MG tablet Take 1 tablet (10 mg total) by mouth every evening. ?Patient not taking: Reported on 01/10/2022 07/24/21   Martyn Malay, MD  ?metFORMIN (GLUCOPHAGE) 500 MG tablet Take 1 tablet (500 mg total) by mouth 2 (two) times daily with a meal. ?Patient not taking: Reported on 01/10/2022 09/06/21 01/10/22  Zenia Resides, MD  ?olmesartan (BENICAR) 20 MG tablet Take 1 tablet (20 mg total) by mouth at bedtime. 09/07/21   Zenia Resides, MD  ?TRULICITY [id number] SOPN ADMINISTER 0.75 MG UNDER THE SKIN 1 TIME A WEEK 10/27/21   Martyn Malay, MD  ?vitamin B-12 (CYANOCOBALAMIN) 1000 MCG tablet Take 1 tablet (1,000 mcg total) by mouth daily. 08/04/21   Owens Shark, NP  ?   ? ?Allergies    ?Patient has no known allergies.   ? ?Review of Systems   ?Review of Systems  ?Constitutional:  Negative for fever.  ?HENT:  Negative for ear pain.   ?Eyes:  Negative for pain.  ?Respiratory:  Negative for cough.   ?Cardiovascular:  Negative for chest pain.  ?Gastrointestinal:  Negative for abdominal pain.  ?Genitourinary:  Negative for flank pain.  ?Musculoskeletal:  Negative for back pain.  ?Skin:   Negative for rash.  ?Neurological:  Negative for headaches.  ? ?Physical Exam ?Updated Vital Signs ?BP (!) 180/118   Pulse 78   Temp 99.1 ?F (37.3 ?C) (Oral)   Resp 16   Ht 5' 3.5" (1.613 m)   Wt 130.2 kg   SpO2 100%   BMI 50.04 kg/m?  ?Physical Exam ?Constitutional:   ?   General: She is not in acute distress. ?   Appearance: Normal appearance.  ?HENT:  ?   Head: Normocephalic.  ?   Nose: Nose normal.  ?Eyes:  ?   Extraocular Movements: Extraocular movements intact.  ?Cardiovascular:  ?   Rate and Rhythm: Normal rate.  ?Pulmonary:  ?   Effort: Pulmonary effort is normal.  ?Musculoskeletal:     ?   General: Normal range of motion.  ?   Cervical back: Normal range of motion.  ?Neurological:  ?   General: No focal deficit present.  ?   Mental Status: She is alert and oriented to person, place, and time. Mental status is at baseline.  ?   Cranial Nerves: No cranial nerve deficit.  ?   Motor: No weakness.  ? ? ?ED Results / Procedures / Treatments   ?Labs ?(all labs ordered are listed, but only abnormal results are displayed) ?Labs Reviewed  ?BASIC METABOLIC PANEL - Abnormal; Notable for the following components:  ?  Result Value  ? Potassium 3.0 (*)   ? Chloride 97 (*)   ? Glucose, Bld 127 (*)   ? Calcium 8.2 (*)   ? All other components within normal limits  ?CBC - Abnormal; Notable for the following components:  ? WBC 16.1 (*)   ? All other components within normal limits  ?TROPONIN I (HIGH SENSITIVITY)  ? ? ?EKG ?None ? ?Radiology ?No results found. ? ?Procedures ?Procedures  ? ? ?Medications Ordered in ED ?Medications  ?potassium chloride SA (KLOR-CON M) CR tablet 40 mEq (40 mEq Oral Given 01/10/22 1731)  ?amLODipine (NORVASC) tablet 10 mg (10 mg Oral Given 01/10/22 1731)  ? ? ?ED Course/ Medical Decision Making/ A&P ?  ?                        ?Medical Decision Making ?Amount and/or Complexity of Data Reviewed ?Labs: ordered. ? ?Risk ?Prescription drug management. ? ? ?Patient continues on olmesartan 20 mg.   Given Norvasc 10 mg.  She states that she was on Norvasc in the past but had been taken off. ? ?Potassium was low here in the ER and repleted.  Troponin normal hemoglobin mildly anemic at 10. ? ?Advised outpatient follow-up with her doctor again within 7 to 10 days for repeat evaluation.  Advised immediate return if she has headache chest pain fevers or any additional concerns otherwise discharged home in stable condition. ? ? ? ? ? ? ? ?Final Clinical Impression(s) / ED Diagnoses ?Final diagnoses:  ?Hypertension, unspecified type  ? ? ?Rx / DC Orders ?ED Discharge Orders   ? ? None  ? ?  ? ? ?  ?Luna Fuse, MD ?01/10/22 1801 ? ?

## 2022-01-10 NOTE — Progress Notes (Signed)
Pt originally scheduled for nexplanon removal and reinsertion.  However, pt had severely elevated blood pressure consistent with hypertensive emergency.  Pt strongly advised to reschedule her appointment for today and to seek care at an ED to address her blood pressure.  Pt initially was upset, but began to understand that her blood pressure was very abnormal.  I told pt I was very concerned about end organ damage as well as risk of stroke.  Pt notes she will seek care and evaluation with an emergency department right now. ? ?Vitals:  ? 01/10/22 1428 01/10/22 1441  ?BP: (!) 192/139 (!) 192/134  ?Pulse: (!) 104 (!) 102  ?  ? ?A/P:  hypertensive emergency. ?Pt to ED for evaluation/ treatment due to risks of stroke or other organ damage. ?Once blood pressure is in better control, she can reschedule for nexplanon removal and reinsertion. ? ?Lynnda Shields, MD ?Faculty attending ?[facility] ? ?

## 2022-01-12 ENCOUNTER — Other Ambulatory Visit (HOSPITAL_COMMUNITY): Payer: Self-pay

## 2022-01-14 ENCOUNTER — Ambulatory Visit: Payer: BC Managed Care – PPO | Admitting: Family Medicine

## 2022-01-14 ENCOUNTER — Other Ambulatory Visit: Payer: Self-pay | Admitting: *Deleted

## 2022-01-14 DIAGNOSIS — E119 Type 2 diabetes mellitus without complications: Secondary | ICD-10-CM

## 2022-01-14 MED ORDER — TRULICITY 0.75 MG/0.5ML ~~LOC~~ SOAJ: SUBCUTANEOUS | 3 refills | Status: DC

## 2022-01-27 NOTE — Progress Notes (Signed)
No show

## 2022-01-28 ENCOUNTER — Encounter: Payer: BC Managed Care – PPO | Admitting: Family Medicine

## 2022-01-28 DIAGNOSIS — Z0000 Encounter for general adult medical examination without abnormal findings: Secondary | ICD-10-CM

## 2022-01-28 DIAGNOSIS — E538 Deficiency of other specified B group vitamins: Secondary | ICD-10-CM

## 2022-01-28 DIAGNOSIS — E119 Type 2 diabetes mellitus without complications: Secondary | ICD-10-CM

## 2022-03-08 ENCOUNTER — Encounter: Payer: Self-pay | Admitting: *Deleted

## 2022-03-16 ENCOUNTER — Inpatient Hospital Stay: Payer: BC Managed Care – PPO

## 2022-03-16 ENCOUNTER — Inpatient Hospital Stay: Payer: BC Managed Care – PPO | Attending: Nurse Practitioner | Admitting: Nurse Practitioner

## 2022-03-18 ENCOUNTER — Telehealth: Payer: Self-pay | Admitting: Oncology

## 2022-03-18 ENCOUNTER — Encounter: Payer: Self-pay | Admitting: *Deleted

## 2022-03-18 NOTE — Telephone Encounter (Signed)
Attempted to contact patient per in basket schedule message but no answer and voicemail was unable to be left

## 2022-03-18 NOTE — Progress Notes (Signed)
"  No show" for lab/OV ( 6 month f/u) on 6/14. Scheduling message sent to reschedule for next 1-2 months

## 2022-03-25 ENCOUNTER — Telehealth: Payer: Self-pay | Admitting: Oncology

## 2022-03-25 NOTE — Telephone Encounter (Signed)
Attempted to contact patient in regards to in basket schedule message. Patient no show appointments on 6/19 tried to call to see about rescheduling no answer and unable to leave voicemail.

## 2022-04-11 ENCOUNTER — Ambulatory Visit: Payer: BC Managed Care – PPO | Admitting: Obstetrics and Gynecology

## 2022-05-09 ENCOUNTER — Encounter: Payer: Self-pay | Admitting: Obstetrics and Gynecology

## 2022-05-09 ENCOUNTER — Other Ambulatory Visit: Payer: Self-pay

## 2022-05-09 ENCOUNTER — Ambulatory Visit (INDEPENDENT_AMBULATORY_CARE_PROVIDER_SITE_OTHER): Payer: BC Managed Care – PPO | Admitting: Obstetrics and Gynecology

## 2022-05-09 VITALS — BP 139/96 | HR 91 | Wt 286.0 lb

## 2022-05-09 DIAGNOSIS — Z3202 Encounter for pregnancy test, result negative: Secondary | ICD-10-CM | POA: Diagnosis not present

## 2022-05-09 DIAGNOSIS — Z30017 Encounter for initial prescription of implantable subdermal contraceptive: Secondary | ICD-10-CM

## 2022-05-09 DIAGNOSIS — Z3046 Encounter for surveillance of implantable subdermal contraceptive: Secondary | ICD-10-CM

## 2022-05-09 DIAGNOSIS — Z3040 Encounter for surveillance of contraceptives, unspecified: Secondary | ICD-10-CM

## 2022-05-09 HISTORY — PX: REMOVAL OF IMPLANON ROD: OBO 1006

## 2022-05-09 HISTORY — PX: INSERTION OF IMPLANON ROD: OBO 1005

## 2022-05-09 LAB — POCT PREGNANCY, URINE: Preg Test, Ur: NEGATIVE

## 2022-05-09 MED ORDER — ETONOGESTREL 68 MG ~~LOC~~ IMPL
68.0000 mg | DRUG_IMPLANT | Freq: Once | SUBCUTANEOUS | Status: AC
  Administered 2022-05-09: 68 mg via SUBCUTANEOUS

## 2022-05-09 NOTE — Procedures (Signed)
Nexplanon Removal and Insertion Procedure Note Patient s/p 07/2018 removal and insertion of new nexplanon. Patient with no problems or issues and has been amenorrheic on it since placement. UPT negative  After informed consent was obtained, the patient's left arm was examined and the Nexplanon rod was noted to be easily palpable. The area was cleaned with alcohol then local anesthesia was infiltrated with 3 ml of 1% lidocaine with epinephrine. The area was prepped with betadine. Using sterile technique, the Nexplanon device was brought to the incision site. The capsule was scrapped off with the scalpel, the Nexplanon grasped with hemostats, and easily removed; the removal site was hemostatic.   The old site was approximately 8 cm proximal to the medial epicondyle in the sulcus between the biceps and triceps on the inner surface. The area was cleaned with betadine  then local anesthesia was infiltrated with 3 ml of 1% lidocaine with epinephrine along the planned insertion track. Using sterile technique the Nexplanon device was inserted per manufacturer's guidelines in the subdermal connective tissue using the standard insertion technique without difficulty. Pressure was applied and the insertion site was hemostatic. The presence of the Nexplanon was confirmed immediately after insertion by palpation by both me and the patient and by checking the tip of needle for the absence of the insert.  A pressure dressing was applied.  The patient tolerated the procedure well.  Cornelia Copa MD Attending [facility] [employer] [facility])

## 2022-05-09 NOTE — Progress Notes (Signed)
Nexplanon inserted 07/31/18

## 2022-05-16 ENCOUNTER — Other Ambulatory Visit: Payer: Self-pay | Admitting: Student

## 2022-05-16 ENCOUNTER — Other Ambulatory Visit: Payer: Self-pay | Admitting: Family Medicine

## 2022-05-16 DIAGNOSIS — E119 Type 2 diabetes mellitus without complications: Secondary | ICD-10-CM

## 2022-05-16 DIAGNOSIS — I10 Essential (primary) hypertension: Secondary | ICD-10-CM

## 2022-08-11 ENCOUNTER — Other Ambulatory Visit: Payer: Self-pay

## 2022-08-11 MED ORDER — AMLODIPINE BESYLATE 10 MG PO TABS: 10.0000 mg | ORAL_TABLET | Freq: Every evening | ORAL | 0 refills | Status: DC

## 2022-12-30 ENCOUNTER — Telehealth: Payer: Self-pay

## 2022-12-30 NOTE — Telephone Encounter (Signed)
A Prior Authorization was initiated for this patients TRULICITY through CoverMyMeds.   Key: [facility]

## 2023-01-05 NOTE — Telephone Encounter (Signed)
Please call patient and schedule for a visit with me.   Thank you! CB

## 2023-01-05 NOTE — Telephone Encounter (Signed)
Prior Auth for patients medication TRULICITY denied by [facility] via CoverMyMeds.   Reason: Per your health plan's criteria, this drug is covered if you meet the following: Your doctor tells [place] that this drug is working for your condition (documentation that the patient has improved while on the medication).  Patient most likely needs f/u appt.   CoverMyMeds Key: [facility]

## 2023-01-05 NOTE — Telephone Encounter (Signed)
Called patient and scheduled appointment.

## 2023-01-19 NOTE — Progress Notes (Signed)
    SUBJECTIVE:   CHIEF COMPLAINT: diabetes check HPI:   Norma Ramsey is a 37 y.o.  with history notable for tobacco use, hypertension, severe B12 deficiency, and type 2 DM  presenting for follow up.  She reports overall she is doing okay. She is not taking any medications currently.   Hypertension BP goal: 130/80 Current medications: Amlodipine 10, olmesartan 20  Side effects: none  Adherence: NA  as out of medications No severe headaches, chest pain, vision changes or dyspnea.   DM Smoking status: Discussed today  Statin: NA as age 62  ACE/ARB if HTN: yes Albuminuria: today  Metformin: has some side effects, prefers trulicity  Current medications: Trulicity    Her only complaint today is intermittent nausea. Happens several times a week. Can be after eating or unrelated to food. Starts as sharp epigastric pain then results in waterbrash in mouth, sweating, and at times vomiting. No blood in vomiting. Has been having looser stools. Completely avoids spicey foods. No weight loss, blood in stool, family history of IBD. No alcohol use.    PERTINENT  PMH / PSH/Family/Social History :  Nexplanon for contraception, placed 2023 Smoking 1-2 cigarettes per day In between jobs right now Son has been stressor--he is out of school due to social anxiety  No new family history   OBJECTIVE:   BP (!) 186/130   Pulse 97   Ht 5' 3.5" (1.613 m)   Wt 284 lb (128.8 kg)   SpO2 100%   BMI 49.52 kg/m   Today's weight:  Last Weight  Most recent update: 01/20/2023  9:27 AM    Weight  128.8 kg (284 lb)            Review of prior weights: [employer]   01/20/23 0927  Weight: 284 lb (128.8 kg)     Cardiac: Regular rate and rhythm. Normal S1/S2. No murmurs, rubs, or gallops appreciated. Lungs: Clear bilaterally to ascultation.  Abdomen: Normoactive bowel sounds. No tenderness to deep or light palpation. No rebound or guarding.  No epigastric pain.  Psych: Pleasant and  appropriate    ASSESSMENT/PLAN:   Essential hypertension Not at goal as not on medications Restarted ARB and CCB BMP today Follow up in 2 weeks   Diabetes mellitus without complication (HCC) A1c well controlled Continue Trulicity Will increase dose in 4 weeks Needs lipid panel and foot exam at follow up She will call her eye doctor--saw them last year   B12 deficiency CBC and B12 today Refilled oral supplementation   Tobacco use Discussed cessation Resources provided Pre contemplative about quitting    Dyspepsia In past had similar symptoms with severe B12 deficiency Will check today along with CBC Trial H2 blocker At follow up consider TTG   HCM Lipid panel and foot exam at follow up      Terisa Starr, MD  Family Medicine Teaching Service  [facility]

## 2023-01-20 ENCOUNTER — Other Ambulatory Visit: Payer: Self-pay

## 2023-01-20 ENCOUNTER — Encounter: Payer: Self-pay | Admitting: Family Medicine

## 2023-01-20 ENCOUNTER — Ambulatory Visit: Payer: Medicaid Other | Admitting: Family Medicine

## 2023-01-20 ENCOUNTER — Telehealth: Payer: Self-pay

## 2023-01-20 VITALS — BP 186/130 | HR 97 | Ht 63.5 in | Wt 284.0 lb

## 2023-01-20 DIAGNOSIS — Z720 Tobacco use: Secondary | ICD-10-CM | POA: Diagnosis not present

## 2023-01-20 DIAGNOSIS — R1013 Epigastric pain: Secondary | ICD-10-CM

## 2023-01-20 DIAGNOSIS — E119 Type 2 diabetes mellitus without complications: Secondary | ICD-10-CM | POA: Diagnosis not present

## 2023-01-20 DIAGNOSIS — E538 Deficiency of other specified B group vitamins: Secondary | ICD-10-CM | POA: Diagnosis not present

## 2023-01-20 DIAGNOSIS — I10 Essential (primary) hypertension: Secondary | ICD-10-CM | POA: Diagnosis not present

## 2023-01-20 LAB — POCT GLYCOSYLATED HEMOGLOBIN (HGB A1C): HbA1c, POC (controlled diabetic range): 5.8 % (ref 0.0–7.0)

## 2023-01-20 MED ORDER — AMLODIPINE BESYLATE 10 MG PO TABS: 10.0000 mg | ORAL_TABLET | Freq: Every evening | ORAL | 3 refills | Status: DC

## 2023-01-20 MED ORDER — TRULICITY 0.75 MG/0.5ML ~~LOC~~ SOAJ: SUBCUTANEOUS | 1 refills | Status: DC

## 2023-01-20 MED ORDER — OLMESARTAN MEDOXOMIL 20 MG PO TABS: 20.0000 mg | ORAL_TABLET | Freq: Every day | ORAL | 3 refills | Status: DC

## 2023-01-20 MED ORDER — VITAMIN B-12 1000 MCG PO TABS: 1000.0000 ug | ORAL_TABLET | Freq: Every day | ORAL | 4 refills | Status: DC

## 2023-01-20 MED ORDER — FAMOTIDINE 20 MG PO TABS: 20.0000 mg | ORAL_TABLET | Freq: Two times a day (BID) | ORAL | 3 refills | Status: DC

## 2023-01-20 NOTE — Assessment & Plan Note (Signed)
Discussed cessation Resources provided Pre contemplative about quitting

## 2023-01-20 NOTE — Assessment & Plan Note (Signed)
CBC and B12 today Refilled oral supplementation

## 2023-01-20 NOTE — Telephone Encounter (Signed)
Patient calls nurse line regarding Trulicity prescription. PA is required.   It appears that PA was denied on 3/29. Reason: Per your health plan's criteria, this drug is covered if you meet the following: Your doctor tells [place] that this drug is working for your condition (documentation that the patient has improved while on the medication).   Patient had follow up appointment in clinic today, 4/19 with PCP.   Durward Mallard- does there need to be a new PA or does appeal need to be completed by provider.   Thank you for your assistance.   Veronda Prude, RN

## 2023-01-20 NOTE — Assessment & Plan Note (Signed)
Not at goal as not on medications Restarted ARB and CCB BMP today Follow up in 2 weeks

## 2023-01-20 NOTE — Assessment & Plan Note (Signed)
A1c well controlled Continue Trulicity Will increase dose in 4 weeks Needs lipid panel and foot exam at follow up She will call her eye doctor--saw them last year

## 2023-01-20 NOTE — Patient Instructions (Signed)
It was wonderful to see you today.  Please bring ALL of your medications with you to every visit.   Today we talked about:  -For your stomach - Please keep a log of when this happens and what you ate beforehand  - I sent in a medication called pepcid to try twice a day - If these symptoms persist, we will have you see gastroenterology   I sent in your blood pressure pills Please return in 2 weeks for a blood pressure check   I will message you with lab work results  Please schedule your eye doctor visit  Tobacco use is damaging to your body. It increases your risk of stroke, heart attack, lung cancer, and serious lung disease in the future. It also reduces your fertility.   Quitting tobacco is the best thing for your health but is a challenge---nicotine, a chemical in cigarettes, is highly addictive.   You can call 1 800 QUIT NOW ([phone])---you will be connected with a Careers information officer. They can also mail you nicotine gums, lozenges, and patches to quit.   Ask me about patches (which you wear all day) and gums (which you use when you have a craving) to help you quit.   There are safe, effective medications to help you quit--  Varencline---also called Chantix---- is the most common medication used to help people stop smoking. It starts a low dose and is increased. I recommended choosing a quit date then starting the medication 8 days before this. Side effects include mild headache, difficulty sleeping, and odd dreams. The medication is typically very well tolerated.     Bupropion---also called Zyban---- is started 1 week before your quit date. You take 1 pill for three days then increase to 1 pill twice per day. Side effects include a mild headache and anxiety---this usually goes away. Some patients experience weight loss.     Please follow up in 2 weeks    Thank you for choosing [facility].   Please call [phone] with any questions about today's  appointment.  Please be sure to schedule follow up at the front  desk before you leave today.   Terisa Starr, MD  Family Medicine

## 2023-01-22 LAB — COMPREHENSIVE METABOLIC PANEL
ALT: 61 IU/L — ABNORMAL HIGH (ref 0–32)
AST: 115 IU/L — ABNORMAL HIGH (ref 0–40)
Albumin/Globulin Ratio: 1.3 (ref 1.2–2.2)
Albumin: 4.2 g/dL (ref 3.9–4.9)
Alkaline Phosphatase: 265 IU/L — ABNORMAL HIGH (ref 44–121)
BUN/Creatinine Ratio: 13 (ref 9–23)
BUN: 7 mg/dL (ref 6–20)
Bilirubin Total: 0.6 mg/dL (ref 0.0–1.2)
CO2: 20 mmol/L (ref 20–29)
Calcium: 8.9 mg/dL (ref 8.7–10.2)
Chloride: 100 mmol/L (ref 96–106)
Creatinine, Ser: 0.56 mg/dL — ABNORMAL LOW (ref 0.57–1.00)
Globulin, Total: 3.2 g/dL (ref 1.5–4.5)
Glucose: 140 mg/dL — ABNORMAL HIGH (ref 70–99)
Potassium: 4.2 mmol/L (ref 3.5–5.2)
Sodium: 136 mmol/L (ref 134–144)
Total Protein: 7.4 g/dL (ref 6.0–8.5)
eGFR: 122 mL/min/{1.73_m2} (ref >59)

## 2023-01-22 LAB — MICROALBUMIN / CREATININE URINE RATIO
Creatinine, Urine: 214.7 mg/dL
Microalb/Creat Ratio: 24 mg/g creat (ref 0–29)
Microalbumin, Urine: 51.6 ug/mL

## 2023-01-22 LAB — CBC
Hematocrit: 38.7 % (ref 34.0–46.6)
Hemoglobin: 13.2 g/dL (ref 11.1–15.9)
MCH: 37.3 pg — ABNORMAL HIGH (ref 26.6–33.0)
MCHC: 34.1 g/dL (ref 31.5–35.7)
MCV: 109 fL — ABNORMAL HIGH (ref 79–97)
Platelets: 431 10*3/uL (ref 150–450)
RBC: 3.54 x10E6/uL — ABNORMAL LOW (ref 3.77–5.28)
RDW: 14 % (ref 11.7–15.4)
WBC: 12 10*3/uL — ABNORMAL HIGH (ref 3.4–10.8)

## 2023-01-22 LAB — VITAMIN B12: Vitamin B-12: 245 pg/mL (ref 232–1245)

## 2023-01-23 NOTE — Telephone Encounter (Signed)
Prior Auth for patients medication TRULICITY approved by [employer] from 01/23/23 to 2/44/25.  CoverMyMeds KeyJasmine Awe PA Case ID #: [id number]

## 2023-03-10 ENCOUNTER — Ambulatory Visit: Payer: Medicaid Other | Admitting: Family Medicine

## 2023-04-04 IMAGING — US US ABDOMEN LIMITED
1 series · 15 of 25 positions shown · non-contrast
Comparison: None.

CLINICAL DATA: History of recent falls with right upper quadrant
pain for 2 days

EXAM:
ULTRASOUND ABDOMEN LIMITED RIGHT UPPER QUADRANT

[Series 1: us abdomen limited ruq mc & wl · 15 of 36 slices shown]
[im 1/36]
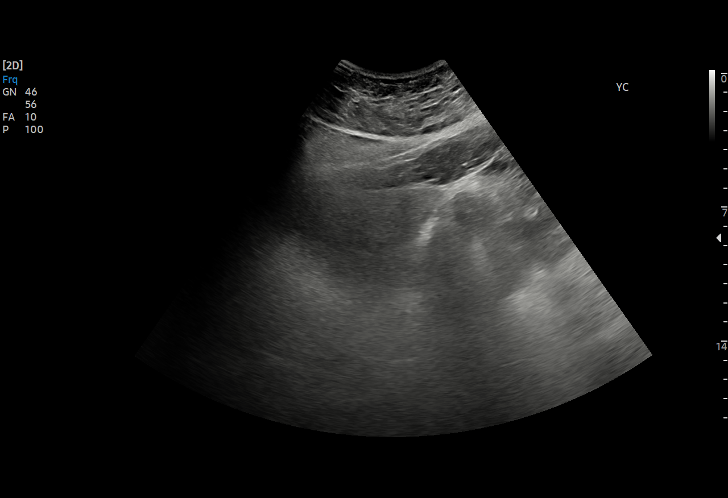
[im 3/36]
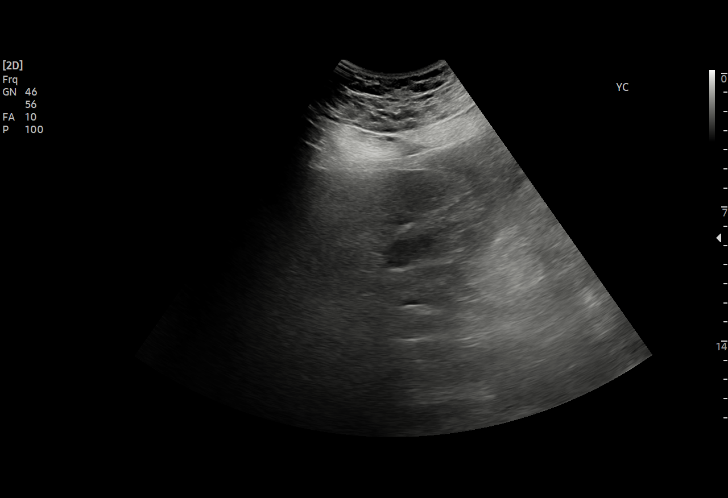
[im 6/36]
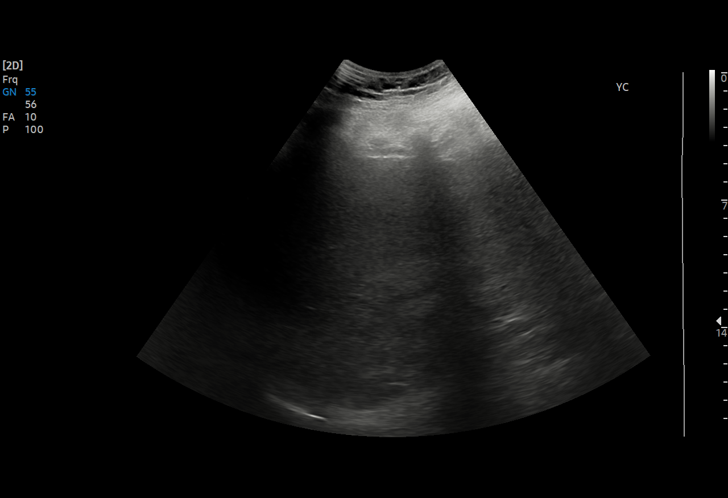
[im 8/36]
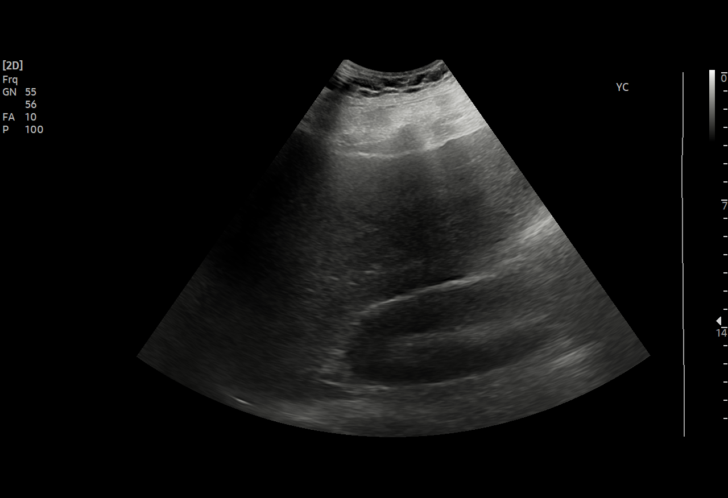
[im 11/36]
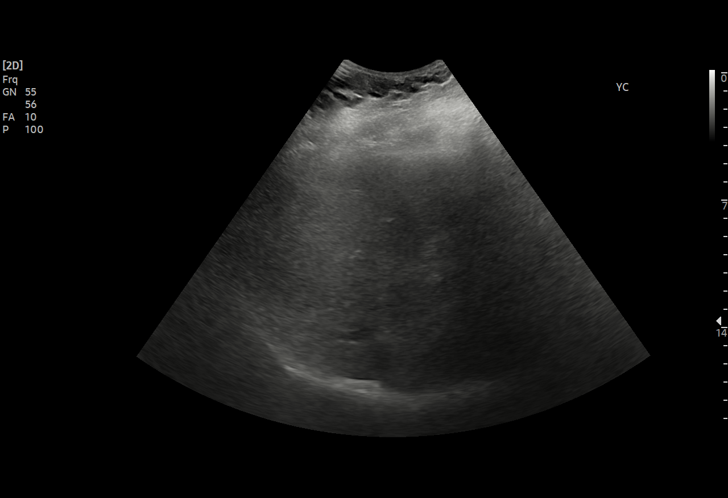
[im 14/36]
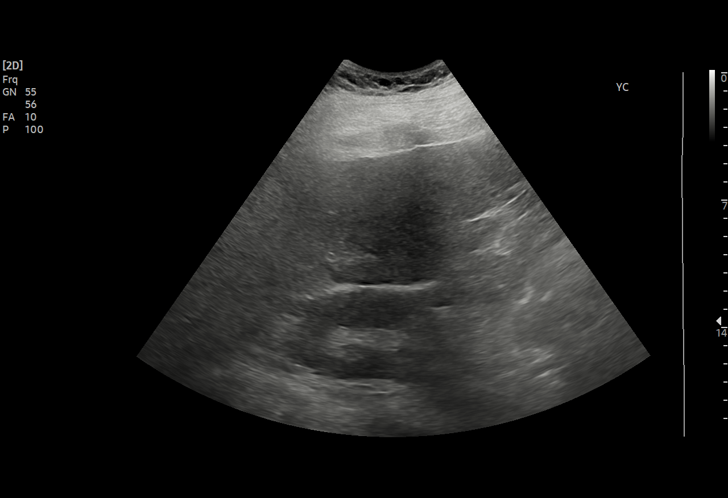
[im 15/36]
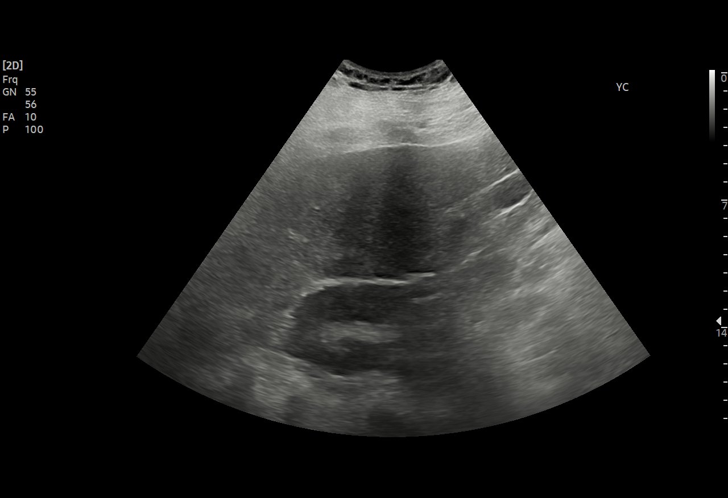
[im 18/36]
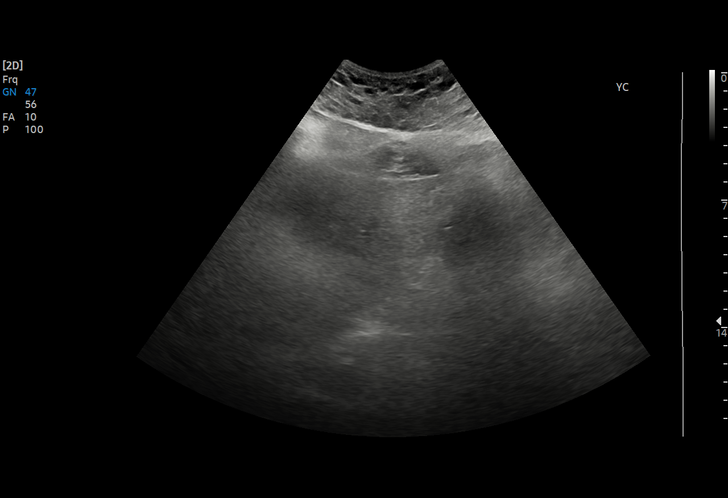
[im 21/36]
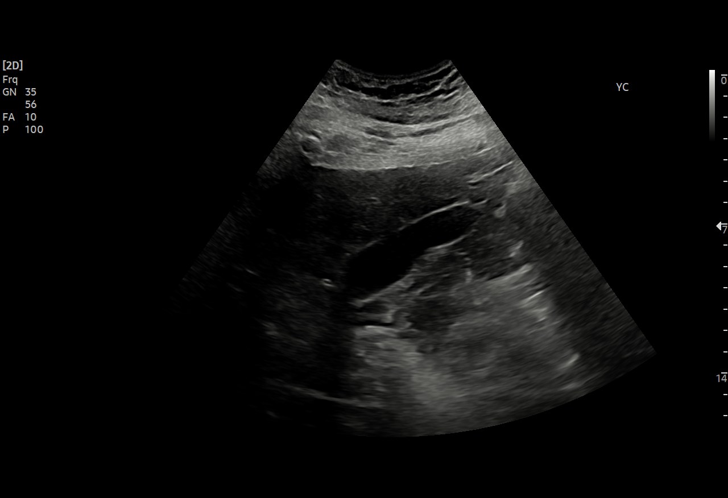
[im 22/36]
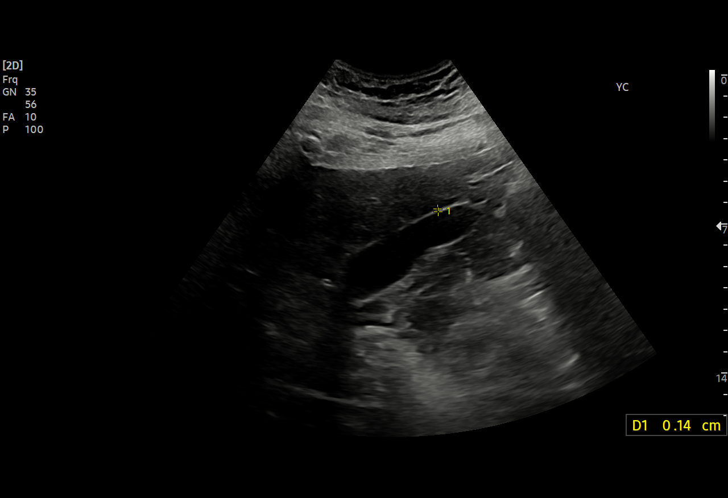
[im 25/36]
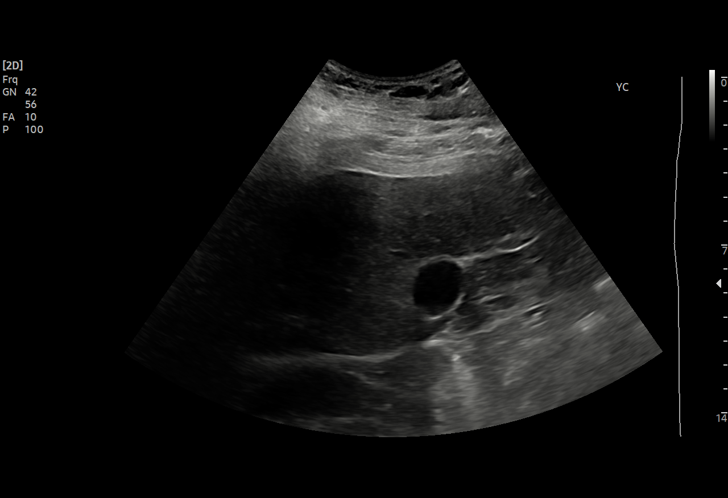
[im 28/36]
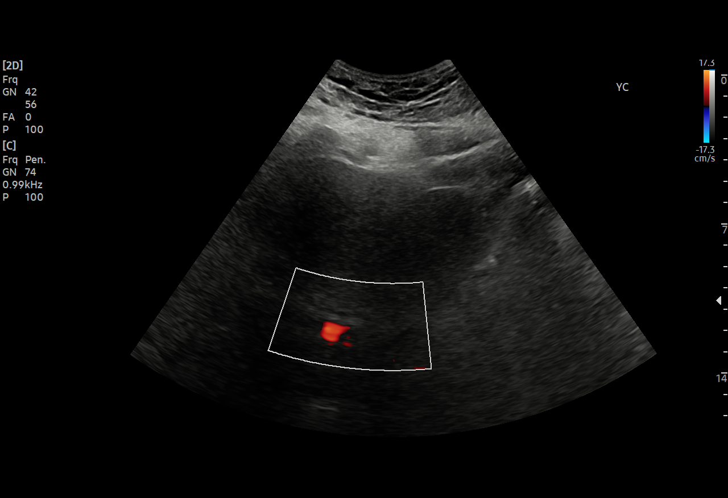
[im 30/36]
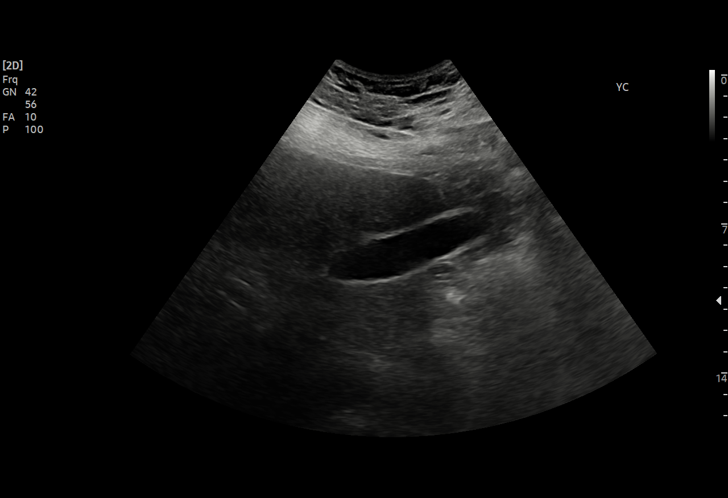
[im 33/36]
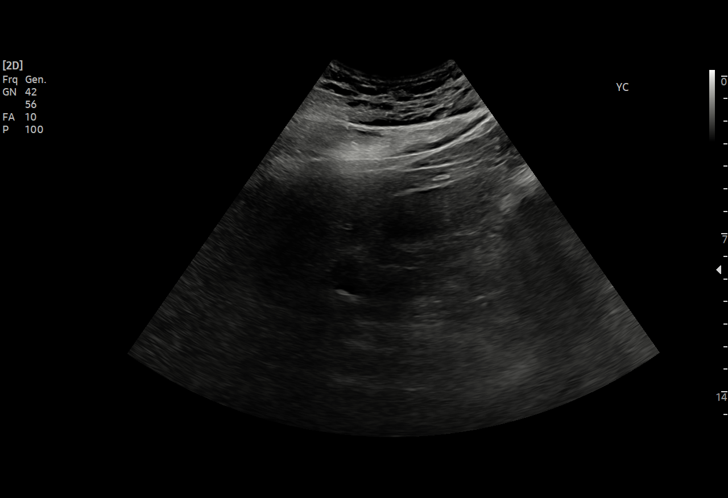
[im 36/36]
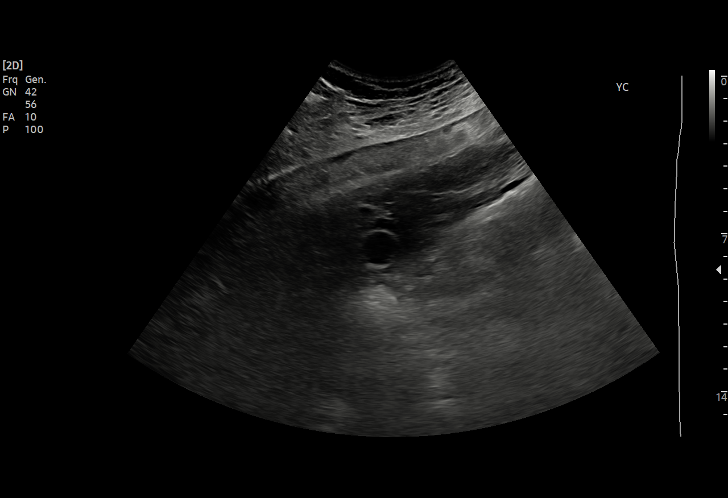

[15 of 25 positions shown; findings below may reference images not displayed]

FINDINGS: Gallbladder:

No gallstones or wall thickening visualized. No sonographic Murphy
sign noted by sonographer.

Common bile duct:

Diameter: 3.3 mm.

Liver:

Diffusely increased in echogenicity consistent with mild fatty
infiltration. No focal mass is noted. Portal vein is patent on color
Doppler imaging with normal direction of blood flow towards the
liver.

Other: None.
IMPRESSION: Fatty liver.

No other focal abnormality is noted.

## 2023-04-04 IMAGING — CT CT ANGIO NECK
3 of 12 series · 7 of 34 positions shown · IV contrast (APPLIED)
Comparison: None.

CLINICAL DATA: Neuro deficit, acute, stroke suspected. Headache,
left sided neck pain, leg weakness, concern for stroke, dissection.
Loss of consciousness after standing up, hitting the head on the
floor.

EXAM:
CT ANGIOGRAPHY HEAD AND NECK
TECHNIQUE: Multidetector CT imaging of the head and neck was performed using
the standard protocol during bolus administration of intravenous
contrast. Multiplanar CT image reconstructions and MIPs were
obtained to evaluate the vascular anatomy. Carotid stenosis
measurements (when applicable) are obtained utilizing NASCET
criteria, using the distal internal carotid diameter as the
denominator.
CONTRAST:  75mL OMNIPAQUE IOHEXOL 350 MG/ML SOLN

[Series 512: sagittal soft · sagittal · 0.28mm/px · 1 of 59 slices shown]
[im 37/59  soft-tissue]
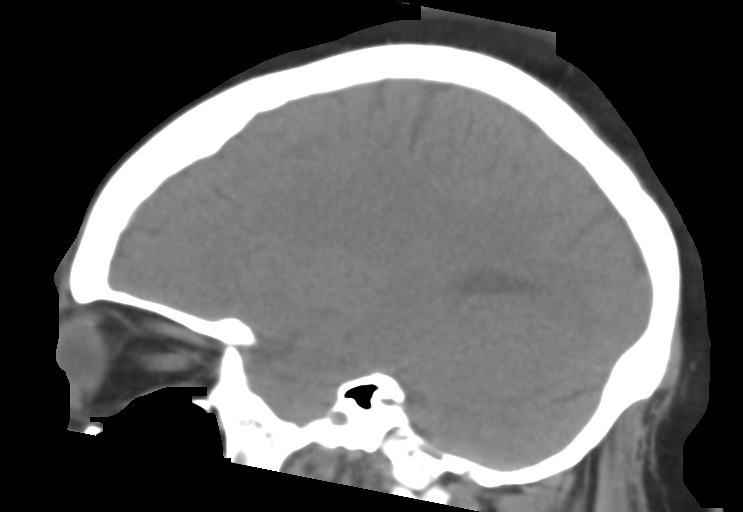

[Series 514: cta head · axial · 0.51mm/px · z∈[+1029,+1231]mm · 4 of 673 slices shown]
[im 135/673  soft-tissue]
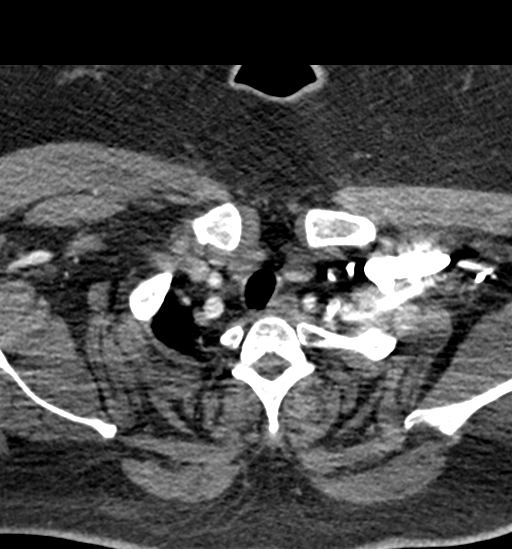
[im 269/673  bone]
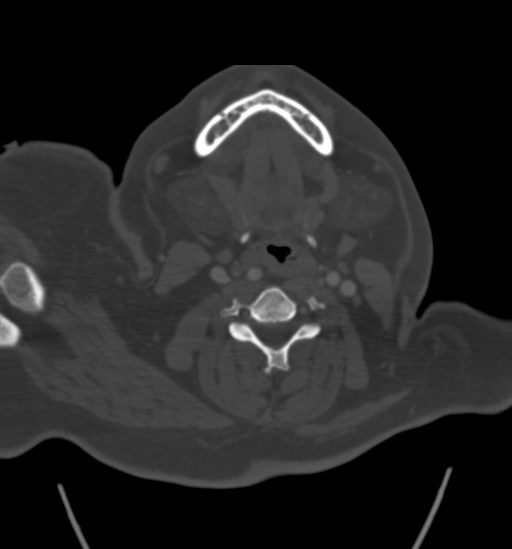
[im 404/673  soft-tissue]
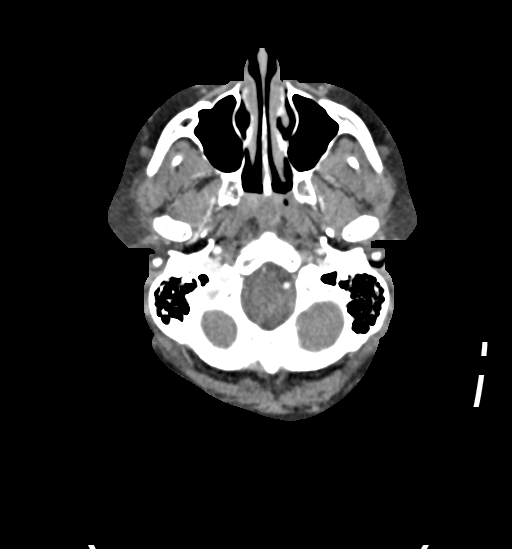
[im 538/673  bone]
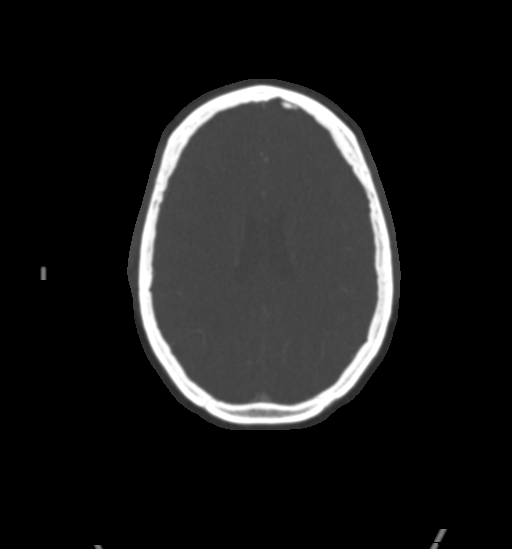

[Series 515: ax thin · axial · 0.51mm/px · z∈[+1074,+1186]mm · 2 of 337 slices shown]
[im 113/337  soft-tissue]
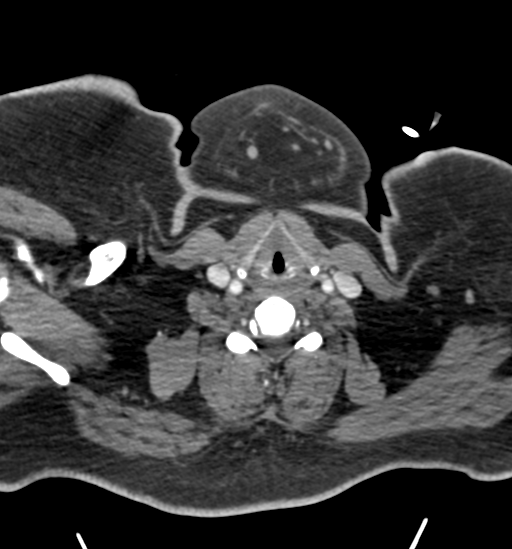
[im 225/337  soft-tissue]
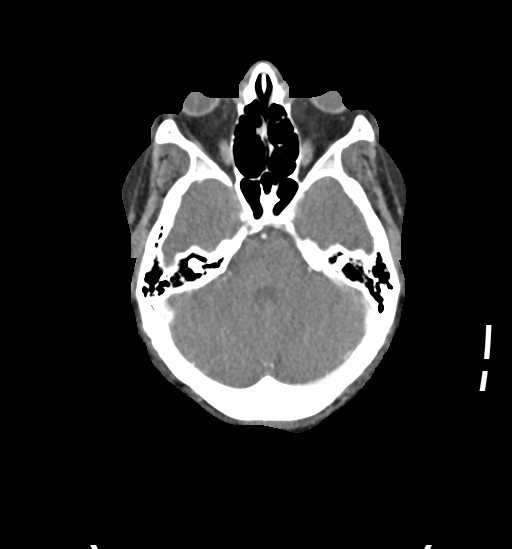

[7 of 34 positions shown; findings below may reference images not displayed]

FINDINGS: CT HEAD FINDINGS

Brain: There is no evidence of an acute infarct, intracranial
hemorrhage, mass, midline shift, or extra-axial fluid collection.
The ventricles and sulci are normal.

Vascular: No hyperdense vessel.

Skull: No acute fracture or suspicious osseous lesion.

Sinuses: Visualized paranasal sinuses and mastoid air cells are
clear.

Orbits: Unremarkable.

Review of the MIP images confirms the above findings

CTA NECK FINDINGS

Aortic arch: Standard 3 vessel aortic arch with widely patent arch
vessel origins.

Right carotid system: Patent with a small amount of calcified and
soft plaque at the carotid bifurcation. No evidence of a significant
stenosis or dissection. Retropharyngeal course of the distal common
and proximal internal carotid arteries.

Left carotid system: Patent with a small amount of calcified and
soft plaque at the carotid bifurcation. No evidence of a significant
stenosis or dissection.

Vertebral arteries: Patent without evidence of a significant
stenosis, dissection, or significant atherosclerosis. Dominant left
vertebral artery.

Skeleton: No acute osseous abnormality or suspicious osseous lesion
is identified. The cervical spine is reported separately.

Other neck: No evidence of cervical lymphadenopathy or mass.

Upper chest: Clear lung apices.

Review of the MIP images confirms the above findings

CTA HEAD FINDINGS

Anterior circulation: The internal carotid arteries are patent from
skull base to carotid termini with minimal nonstenotic plaque
bilaterally. ACAs and MCAs are patent without evidence of a proximal
branch occlusion or significant proximal stenosis. No aneurysm is
identified.

Posterior circulation: The intracranial vertebral arteries are
patent to the basilar. The basilar artery is widely patent. There
are small posterior communicating arteries bilaterally. Both PCAs
are patent without evidence of a significant proximal stenosis. No
aneurysm is identified.

Venous sinuses: Patent.

Anatomic variants: None.

Review of the MIP images confirms the above findings
IMPRESSION: 1. Unremarkable CT appearance of the brain. No evidence of acute
intracranial abnormality.
2. Minimal atherosclerosis in the head and neck without large vessel
occlusion, significant stenosis, dissection, or aneurysm.

## 2023-04-24 ENCOUNTER — Ambulatory Visit (INDEPENDENT_AMBULATORY_CARE_PROVIDER_SITE_OTHER): Payer: 59 | Admitting: Student

## 2023-04-24 ENCOUNTER — Other Ambulatory Visit: Payer: Self-pay

## 2023-04-24 ENCOUNTER — Encounter: Payer: Self-pay | Admitting: Student

## 2023-04-24 DIAGNOSIS — Z59819 Housing instability, housed unspecified: Secondary | ICD-10-CM | POA: Diagnosis not present

## 2023-04-24 DIAGNOSIS — I10 Essential (primary) hypertension: Secondary | ICD-10-CM

## 2023-04-24 NOTE — Assessment & Plan Note (Signed)
Blood pressure today is within goal.  Looks to be well-controlled on current medication.  Advised patient to continue medication as prescribed. -Continue amlodipine, olmesartan -BMP ordered -Encourage ambulatory blood pressure monitoring.

## 2023-04-24 NOTE — Patient Instructions (Addendum)
It was wonderful to meet  you today. Thank you for allowing me to be a part of your care. Below is a short summary of what we discussed at your visit today:  Your blood pressure today was normal.  I recommend you continue taking your home blood pressure medications.  I also think you should check your blood pressure at home, you can go to any pharmacy and check your blood pressures while you are at home.  Your concerns for heart palpitations, blurry vision, nausea and dizziness.  This could be signs of dehydration or other symptoms.  I have ordered labs to evaluate the symptoms.  Please come to the clinic at a separate day to obtain labs to check your blood count, electrolyte level, kidney function and your thyroid.   Please bring all of your medications to every appointment!  If you have any questions or concerns, please do not hesitate to contact us via phone or MyChart message.   Jerre Simon, MD [facility]

## 2023-04-24 NOTE — Progress Notes (Cosign Needed Addendum)
    SUBJECTIVE:   CHIEF COMPLAINT / HPI:    Patient is a 37 year old female presenting today for BP follow-up.  Hypertension BP goal: 130/80 Current medications: Amlodipine 10, olmesartan 20  Adherence: NA  as out of medications No severe headaches, chest pain, vision changes or dyspnea. Patient reports  decreased appetite, feels off, nausea , dizziness unsure if this is related to her medication Last time she felt like days she has had high hemoglobin was low that was 2 years ago and requiring blood transfusion.  She does not have any signs or concerns of bleeding currently.  Housing insecurity Reports she has been more anxious than usual in the past 1-2 months due to financial burden including housing insecurity.  She says she was recently evicted from her home about a month ago.  And prior to that lost her job.  She finally found a job but is working on catching up on her bills.  PERTINENT  PMH / PSH: Reviewed   OBJECTIVE:   BP 125/76   Pulse 99   Ht 5\' 2"  (1.575 m)   Wt 282 lb 6.4 oz (128.1 kg)   SpO2 100%   BMI 51.65 kg/m    Physical Exam General: Alert, Mobidly obese, NAD Cardiovascular: RRR, No Murmurs, Normal S2/S2 Respiratory: CTAB, No wheezing or Rales Abdomen: No distension or tenderness Extremities: No edema on extremities     ASSESSMENT/PLAN:   Essential hypertension Blood pressure today is within goal.  Looks to be well-controlled on current medication.  Advised patient to continue medication as prescribed. -Continue amlodipine, olmesartan -BMP ordered -Encourage ambulatory blood pressure monitoring.   Dizziness Patient reports symptoms of intermittent dizziness, palpitations, vision changes and.  Suspect this to be attributed to her medication but highly doubt this could be because of the symptoms.  Does endorse having similar symptoms when she was anemic 2 years ago.  Unclear cause of her dizziness at this time but differential include dehydration,  cardiac etiology, anemia.  Hgb from 3 months ago shows hemoglobin of 13.2.  Her constellation of symptoms and reports of major life stressors including house insecurity and financial constraint is suspicious of underlying anxiety -Obtain future lab for CBC, BMP, TSH -Planned follow up in 2-3 weeks or earlier as needed -Will consider obtaining EKG  Housing Insecurity -Placed referral to [facility] care management ref [phone].  Jerre Simon, MD [facility]

## 2023-04-25 ENCOUNTER — Other Ambulatory Visit: Payer: 59

## 2023-04-25 DIAGNOSIS — I10 Essential (primary) hypertension: Secondary | ICD-10-CM

## 2023-04-26 ENCOUNTER — Ambulatory Visit: Payer: 59 | Admitting: Student

## 2023-04-26 ENCOUNTER — Telehealth: Payer: Self-pay | Admitting: Student

## 2023-04-26 ENCOUNTER — Inpatient Hospital Stay (HOSPITAL_COMMUNITY)
Admission: EM | Admit: 2023-04-26 | Discharge: 2023-04-29 | DRG: 641 | Disposition: A | Discharge status: Home / Self Care | Payer: 59 | Attending: Family Medicine | Admitting: Family Medicine

## 2023-04-26 ENCOUNTER — Other Ambulatory Visit: Payer: Self-pay

## 2023-04-26 ENCOUNTER — Ambulatory Visit (HOSPITAL_COMMUNITY)
Admission: RE | Admit: 2023-04-26 | Discharge: 2023-04-26 | Disposition: A | Discharge status: Home / Self Care | Payer: 59 | Source: Ambulatory Visit | Attending: Family Medicine | Admitting: Family Medicine

## 2023-04-26 ENCOUNTER — Emergency Department (HOSPITAL_COMMUNITY): Payer: 59

## 2023-04-26 ENCOUNTER — Encounter (HOSPITAL_COMMUNITY): Payer: Self-pay | Admitting: *Deleted

## 2023-04-26 DIAGNOSIS — Z79899 Other long term (current) drug therapy: Secondary | ICD-10-CM

## 2023-04-26 DIAGNOSIS — E874 Mixed disorder of acid-base balance: Secondary | ICD-10-CM | POA: Diagnosis present

## 2023-04-26 DIAGNOSIS — R002 Palpitations: Secondary | ICD-10-CM | POA: Diagnosis present

## 2023-04-26 DIAGNOSIS — K219 Gastro-esophageal reflux disease without esophagitis: Secondary | ICD-10-CM | POA: Diagnosis present

## 2023-04-26 DIAGNOSIS — R200 Anesthesia of skin: Secondary | ICD-10-CM | POA: Diagnosis present

## 2023-04-26 DIAGNOSIS — Z8249 Family history of ischemic heart disease and other diseases of the circulatory system: Secondary | ICD-10-CM

## 2023-04-26 DIAGNOSIS — E669 Obesity, unspecified: Secondary | ICD-10-CM | POA: Diagnosis present

## 2023-04-26 DIAGNOSIS — E876 Hypokalemia: Secondary | ICD-10-CM | POA: Diagnosis present

## 2023-04-26 DIAGNOSIS — K909 Intestinal malabsorption, unspecified: Secondary | ICD-10-CM | POA: Diagnosis present

## 2023-04-26 DIAGNOSIS — I10 Essential (primary) hypertension: Secondary | ICD-10-CM | POA: Diagnosis present

## 2023-04-26 DIAGNOSIS — R748 Abnormal levels of other serum enzymes: Secondary | ICD-10-CM | POA: Diagnosis present

## 2023-04-26 DIAGNOSIS — Z7985 Long-term (current) use of injectable non-insulin antidiabetic drugs: Secondary | ICD-10-CM

## 2023-04-26 DIAGNOSIS — E878 Other disorders of electrolyte and fluid balance, not elsewhere classified: Secondary | ICD-10-CM | POA: Diagnosis present

## 2023-04-26 DIAGNOSIS — R1084 Generalized abdominal pain: Secondary | ICD-10-CM | POA: Diagnosis present

## 2023-04-26 DIAGNOSIS — R059 Cough, unspecified: Secondary | ICD-10-CM

## 2023-04-26 DIAGNOSIS — F1210 Cannabis abuse, uncomplicated: Secondary | ICD-10-CM | POA: Diagnosis present

## 2023-04-26 DIAGNOSIS — R000 Tachycardia, unspecified: Secondary | ICD-10-CM | POA: Diagnosis not present

## 2023-04-26 DIAGNOSIS — D539 Nutritional anemia, unspecified: Secondary | ICD-10-CM | POA: Diagnosis present

## 2023-04-26 DIAGNOSIS — R1110 Vomiting, unspecified: Secondary | ICD-10-CM | POA: Diagnosis present

## 2023-04-26 DIAGNOSIS — E538 Deficiency of other specified B group vitamins: Secondary | ICD-10-CM

## 2023-04-26 DIAGNOSIS — R053 Chronic cough: Secondary | ICD-10-CM | POA: Diagnosis present

## 2023-04-26 DIAGNOSIS — R9431 Abnormal electrocardiogram [ECG] [EKG]: Secondary | ICD-10-CM | POA: Insufficient documentation

## 2023-04-26 DIAGNOSIS — Z6841 Body Mass Index (BMI) 40.0 and over, adult: Secondary | ICD-10-CM

## 2023-04-26 DIAGNOSIS — R531 Weakness: Secondary | ICD-10-CM | POA: Insufficient documentation

## 2023-04-26 DIAGNOSIS — D7589 Other specified diseases of blood and blood-forming organs: Secondary | ICD-10-CM | POA: Diagnosis present

## 2023-04-26 DIAGNOSIS — D510 Vitamin B12 deficiency anemia due to intrinsic factor deficiency: Secondary | ICD-10-CM | POA: Diagnosis present

## 2023-04-26 DIAGNOSIS — E860 Dehydration: Secondary | ICD-10-CM | POA: Diagnosis present

## 2023-04-26 DIAGNOSIS — R3129 Other microscopic hematuria: Secondary | ICD-10-CM | POA: Diagnosis present

## 2023-04-26 DIAGNOSIS — D518 Other vitamin B12 deficiency anemias: Secondary | ICD-10-CM | POA: Insufficient documentation

## 2023-04-26 DIAGNOSIS — Z833 Family history of diabetes mellitus: Secondary | ICD-10-CM

## 2023-04-26 DIAGNOSIS — E119 Type 2 diabetes mellitus without complications: Secondary | ICD-10-CM

## 2023-04-26 DIAGNOSIS — E559 Vitamin D deficiency, unspecified: Secondary | ICD-10-CM | POA: Insufficient documentation

## 2023-04-26 DIAGNOSIS — R197 Diarrhea, unspecified: Secondary | ICD-10-CM | POA: Diagnosis present

## 2023-04-26 DIAGNOSIS — E11649 Type 2 diabetes mellitus with hypoglycemia without coma: Secondary | ICD-10-CM | POA: Diagnosis present

## 2023-04-26 DIAGNOSIS — F17210 Nicotine dependence, cigarettes, uncomplicated: Secondary | ICD-10-CM | POA: Diagnosis present

## 2023-04-26 LAB — COMPREHENSIVE METABOLIC PANEL
ALT: 17 IU/L (ref 0–32)
ALT: 19 U/L (ref 0–44)
AST: 27 IU/L (ref 0–40)
AST: 36 U/L (ref 15–41)
Albumin: 3.9 g/dL (ref 3.9–4.9)
Albumin: 3.1 g/dL — ABNORMAL LOW (ref 3.5–5.0)
Alkaline Phosphatase: 196 IU/L — ABNORMAL HIGH (ref 44–121)
Alkaline Phosphatase: 150 U/L — ABNORMAL HIGH (ref 38–126)
Anion gap: 17 — ABNORMAL HIGH (ref 5–15)
BUN/Creatinine Ratio: 6 — ABNORMAL LOW (ref 9–23)
BUN: 3 mg/dL — ABNORMAL LOW (ref 6–20)
BUN: 5 mg/dL — ABNORMAL LOW (ref 6–20)
Bilirubin Total: 0.7 mg/dL (ref 0.0–1.2)
CO2: 26 mmol/L (ref 20–29)
CO2: 26 mmol/L (ref 22–32)
Calcium: 6.5 mg/dL — CL (ref 8.7–10.2)
Calcium: 6.6 mg/dL — ABNORMAL LOW (ref 8.9–10.3)
Chloride: 98 mmol/L (ref 96–106)
Chloride: 97 mmol/L — ABNORMAL LOW (ref 98–111)
Creatinine, Ser: 0.47 mg/dL — ABNORMAL LOW (ref 0.57–1.00)
Creatinine, Ser: 0.6 mg/dL (ref 0.44–1.00)
GFR, Estimated: >60 mL/min (ref >60)
Globulin, Total: 3.2 g/dL (ref 1.5–4.5)
Glucose, Bld: 149 mg/dL — ABNORMAL HIGH (ref 70–99)
Glucose: 109 mg/dL — ABNORMAL HIGH (ref 70–99)
Potassium: 3.4 mmol/L — ABNORMAL LOW (ref 3.5–5.2)
Potassium: 2.7 mmol/L — CL (ref 3.5–5.1)
Sodium: 141 mmol/L (ref 134–144)
Sodium: 140 mmol/L (ref 135–145)
Total Bilirubin: 1 mg/dL (ref 0.3–1.2)
Total Protein: 7.1 g/dL (ref 6.0–8.5)
Total Protein: 7.2 g/dL (ref 6.5–8.1)
eGFR: 127 mL/min/{1.73_m2} (ref >59)

## 2023-04-26 LAB — URINALYSIS, ROUTINE W REFLEX MICROSCOPIC
Bilirubin Urine: NEGATIVE
Glucose, UA: NEGATIVE mg/dL
Ketones, ur: NEGATIVE mg/dL
Nitrite: NEGATIVE
Protein, ur: 30 mg/dL — AB
Specific Gravity, Urine: 1.021 (ref 1.005–1.030)
pH: 6 (ref 5.0–8.0)

## 2023-04-26 LAB — CBC
HCT: 33 % — ABNORMAL LOW (ref 36.0–46.0)
Hematocrit: 34 % (ref 34.0–46.6)
Hemoglobin: 12 g/dL (ref 11.1–15.9)
Hemoglobin: 11.8 g/dL — ABNORMAL LOW (ref 12.0–15.0)
MCH: 39.9 pg — ABNORMAL HIGH (ref 26.6–33.0)
MCH: 39.2 pg — ABNORMAL HIGH (ref 26.0–34.0)
MCHC: 35.3 g/dL (ref 31.5–35.7)
MCHC: 35.8 g/dL (ref 30.0–36.0)
MCV: 113 fL — ABNORMAL HIGH (ref 79–97)
MCV: 109.6 fL — ABNORMAL HIGH (ref 80.0–100.0)
Platelets: 481 10*3/uL — ABNORMAL HIGH (ref 150–450)
Platelets: 481 10*3/uL — ABNORMAL HIGH (ref 150–400)
RBC: 3.01 x10E6/uL — ABNORMAL LOW (ref 3.77–5.28)
RBC: 3.01 MIL/uL — ABNORMAL LOW (ref 3.87–5.11)
RDW: 15.3 % (ref 11.7–15.4)
RDW: 14.4 % (ref 11.5–15.5)
WBC: 16.3 10*3/uL — ABNORMAL HIGH (ref 3.4–10.8)
WBC: 14.3 10*3/uL — ABNORMAL HIGH (ref 4.0–10.5)
nRBC: 0 % (ref 0.0–0.2)

## 2023-04-26 LAB — HCG, SERUM, QUALITATIVE: Preg, Serum: NEGATIVE

## 2023-04-26 LAB — MAGNESIUM: Magnesium: 1.1 mg/dL — ABNORMAL LOW (ref 1.7–2.4)

## 2023-04-26 LAB — BASIC METABOLIC PANEL
BUN/Creatinine Ratio: 9 (ref 9–23)
BUN: 5 mg/dL — ABNORMAL LOW (ref 6–20)
CO2: 24 mmol/L (ref 20–29)
Calcium: 6.7 mg/dL — CL (ref 8.7–10.2)
Chloride: 97 mmol/L (ref 96–106)
Creatinine, Ser: 0.53 mg/dL — ABNORMAL LOW (ref 0.57–1.00)
Glucose: 165 mg/dL — ABNORMAL HIGH (ref 70–99)
Potassium: 3.3 mmol/L — ABNORMAL LOW (ref 3.5–5.2)
Sodium: 140 mmol/L (ref 134–144)
eGFR: 124 mL/min/{1.73_m2} (ref >59)

## 2023-04-26 LAB — PTH, INTACT AND CALCIUM: PTH: 55 pg/mL (ref 15–65)

## 2023-04-26 LAB — TSH: TSH: 1.17 u[IU]/mL (ref 0.450–4.500)

## 2023-04-26 LAB — POTASSIUM: Potassium: 2.4 mmol/L — CL (ref 3.5–5.1)

## 2023-04-26 LAB — LIPASE, BLOOD: Lipase: 38 U/L (ref 11–51)

## 2023-04-26 MED ORDER — POTASSIUM CHLORIDE 10 MEQ/100ML IV SOLN
10.0000 meq | INTRAVENOUS | Status: AC
  Administered 2023-04-26 – 2023-04-27 (×4): 10 meq via INTRAVENOUS
  Filled 2023-04-26 (×3): qty 100

## 2023-04-26 MED ORDER — POTASSIUM CHLORIDE CRYS ER 20 MEQ PO TBCR
40.0000 meq | EXTENDED_RELEASE_TABLET | Freq: Once | ORAL | Status: AC
  Administered 2023-04-26: 40 meq via ORAL
  Filled 2023-04-26: qty 2

## 2023-04-26 MED ORDER — POTASSIUM CHLORIDE 10 MEQ/100ML IV SOLN
10.0000 meq | INTRAVENOUS | Status: DC
  Administered 2023-04-26: 10 meq via INTRAVENOUS
  Filled 2023-04-26: qty 100

## 2023-04-26 MED ORDER — MAGNESIUM SULFATE 4 GM/100ML IV SOLN
4.0000 g | Freq: Once | INTRAVENOUS | Status: AC
  Administered 2023-04-26: 4 g via INTRAVENOUS
  Filled 2023-04-26: qty 100

## 2023-04-26 MED ORDER — SODIUM CHLORIDE 0.9 % IV SOLN
4.0000 g | Freq: Once | INTRAVENOUS | Status: AC
  Administered 2023-04-27: 4 g via INTRAVENOUS
  Filled 2023-04-26: qty 40

## 2023-04-26 NOTE — ED Triage Notes (Signed)
Abd pain also appetite low

## 2023-04-26 NOTE — ED Provider Notes (Signed)
EMERGENCY DEPARTMENT AT [facility] Provider Note   CSN: [id number] Arrival date & time: 04/26/23  1930     History  Chief Complaint  Patient presents with   Fatigue    Norma Ramsey is a 37 y.o. female.  38 y.o female with a PMH of tobacco use presents to the ED with a chief complaint of generalized weakness, not feeling herself for about a month.  Evaluated by primary care physician yesterday, had routine labs obtained, these show critical value such as low calcium.  Patient reports she has been feeling "not herself ", states that she feels weaker than normal, reports she feels very short of breath whenever she tries to ambulate.  She does have a history of tobacco use, some smoking 1 to 2 cigarettes daily since the age of 67.  She also reports some generalized abdominal pain, and had some episodes of diarrhea.  She reports being called this morning at 3 AM in order to be seen for her critical low calcium.No other complaints.   The history is provided by the patient.       Home Medications Prior to Admission medications   Medication Sig Start Date End Date Taking? Authorizing Provider  amLODipine (NORVASC) 10 MG tablet Take 1 tablet (10 mg total) by mouth every evening. 01/20/23   Westley Chandler, MD  cyanocobalamin (VITAMIN B12) 1000 MCG tablet Take 1 tablet (1,000 mcg total) by mouth daily. 01/20/23   Westley Chandler, MD  Dulaglutide (TRULICITY) 0.75 MG/0.5ML SOPN Inject 0.75 mg weekly 01/20/23   Westley Chandler, MD  famotidine (PEPCID) 20 MG tablet Take 1 tablet (20 mg total) by mouth 2 (two) times daily. 01/20/23   Westley Chandler, MD  olmesartan (BENICAR) 20 MG tablet Take 1 tablet (20 mg total) by mouth daily. 01/20/23   Westley Chandler, MD      Allergies    Patient has no known allergies.    Review of Systems   Review of Systems  Constitutional:  Negative for chills and fever.  HENT:  Negative for sore throat.   Respiratory:  Negative for shortness  of breath.   Cardiovascular:  Negative for chest pain.  Gastrointestinal:  Positive for nausea. Negative for abdominal pain and vomiting.  Genitourinary:  Negative for flank pain.  Musculoskeletal:  Positive for arthralgias and myalgias. Negative for back pain.  All other systems reviewed and are negative.   Physical Exam Updated Vital Signs BP (!) 160/112 (BP Location: Left Arm)   Pulse 100   Temp 98.6 F (37 C)   Resp (!) 24   SpO2 100%  Physical Exam Vitals and nursing note reviewed.  Constitutional:      Appearance: Normal appearance.  HENT:     Head: Normocephalic and atraumatic.     Mouth/Throat:     Mouth: Mucous membranes are moist.  Eyes:     Pupils: Pupils are equal, round, and reactive to light.  Cardiovascular:     Rate and Rhythm: Normal rate.  Pulmonary:     Effort: Pulmonary effort is normal.     Breath sounds: No wheezing or rales.  Abdominal:     General: Abdomen is flat.     Palpations: Abdomen is soft.     Tenderness: There is no abdominal tenderness. There is no right CVA tenderness or left CVA tenderness.  Musculoskeletal:     Cervical back: Normal range of motion and neck supple.  Skin:  General: Skin is warm and dry.  Neurological:     Mental Status: She is alert and oriented to person, place, and time.     ED Results / Procedures / Treatments   Labs (all labs ordered are listed, but only abnormal results are displayed) Labs Reviewed  COMPREHENSIVE METABOLIC PANEL - Abnormal; Notable for the following components:      Result Value   Potassium 2.7 (*)    Chloride 97 (*)    Glucose, Bld 149 (*)    BUN 5 (*)    Calcium 6.6 (*)    Albumin 3.1 (*)    Alkaline Phosphatase 150 (*)    Anion gap 17 (*)    All other components within normal limits  CBC - Abnormal; Notable for the following components:   WBC 14.3 (*)    RBC 3.01 (*)    Hemoglobin 11.8 (*)    HCT 33.0 (*)    MCV 109.6 (*)    MCH 39.2 (*)    Platelets 481 (*)    All other  components within normal limits  URINALYSIS, ROUTINE W REFLEX MICROSCOPIC - Abnormal; Notable for the following components:   Color, Urine AMBER (*)    APPearance HAZY (*)    Hgb urine dipstick MODERATE (*)    Protein, ur 30 (*)    Leukocytes,Ua TRACE (*)    Bacteria, UA FEW (*)    All other components within normal limits  MAGNESIUM - Abnormal; Notable for the following components:   Magnesium 1.1 (*)    All other components within normal limits  LIPASE, BLOOD  HCG, SERUM, QUALITATIVE  CALCIUM, IONIZED  POTASSIUM    EKG EKG Interpretation Date/Time:  Wednesday April 26 2023 19:35:05 EDT Ventricular Rate:  114 PR Interval:  126 QRS Duration:  78 QT Interval:  358 QTC Calculation: 493 R Axis:   73  Text Interpretation: Sinus tachycardia Nonspecific ST and T wave abnormality Abnormal ECG When compared with ECG of 26-Apr-2023 10:39, PREVIOUS ECG IS PRESENT Confirmed by Virgina Norfolk [phone]) on 04/26/2023 8:47:47 PM  Radiology DG Chest 2 View  Result Date: 04/26/2023 CLINICAL DATA:  Weakness EXAM: CHEST - 2 VIEW COMPARISON:  Chest x-ray 07/19/2021 FINDINGS: The heart size and mediastinal contours are within normal limits. Both lungs are clear. The visualized skeletal structures are unremarkable. IMPRESSION: No active cardiopulmonary disease. Electronically Signed   By: Darliss Cheney M.D.   On: 04/26/2023 21:47    Procedures Procedures    Medications Ordered in ED Medications - No data to display  ED Course/ Medical Decision Making/ A&P Clinical Course as of 04/26/23 2209  Wed Apr 26, 2023  2125 Potassium(!!): 2.7 [JS]  2158 WBC, UA: 11-20 [JS]  2158 Bacteria, UA(!): FEW [JS]    Clinical Course User Index [JS] Claude Manges, PA-C                             Medical Decision Making Amount and/or Complexity of Data Reviewed Labs: ordered. Decision-making details documented in ED Course. Radiology: ordered.   This patient presents to the ED for concern of critical  blood , this involves a number of treatment options, and is a complaint that carries with it a high risk of complications and morbidity.  The differential diagnosis includes electrolyte derangement, infection versus malignancy.    Co morbidities: Discussed in HPI   Brief History:  See HPI.   EMR reviewed including pt PMHx, past  surgical history and past visits to ER.   See HPI for more details   Lab Tests:  I ordered and independently interpreted labs.  The pertinent results include:    CBC with leukocytosis of 14, hemoglobin slightly decreased.  CMP remarkable for hypokalemia, hypocalcemia with a critical potassium of 6.6, she has normal LFTs.  Lipase levels normal.  hCG is negative.  Magnesium is 1.1 slightly decreased.  She did have a normal potassium earlier today, therefore will obtain repeat potassium prior to her bleeding.   Imaging Studies:  NAD. I personally reviewed all imaging studies and no acute abnormality found. I agree with radiology interpretation. Chest x-ray without any acute finding.   Cardiac Monitoring:  The patient was maintained on a cardiac monitor.  I personally viewed and interpreted the cardiac monitored which showed an underlying rhythm ZO:XWRUE tachycardia EKG non-ischemic   Medicines ordered:  N/A  Consults:  I requested consultation with family medicine,  and discussed lab and imaging findings as well as pertinent plan - they recommend: admission along with electrolyte replacement.   Reevaluation:  After the interventions noted above I re-evaluated patient and found that they have :stayed the same  Social Determinants of Health:  The patient's social determinants of health were a factor in the care of this patient  Problem List / ED Course:  Patient presents to the ED with a chief complaint of generalized "not feeling herself "for the past month.  Evaluated by her PCP yesterday, had abnormal labs such as low calcium along with  potassium. Will obtain repeat blood work of potassium prior to replacement.  Calcium seems to be consistently low.  Patient does not have any complaints aside from some diarrhea that is been ongoing for the past month intermittently.  She does report feeling short of breath along with so rundown and fatigued.  She does have a history of tobacco use, since the age of 53.  Chest x-ray here without any acute findings at this time.  She does have slight elevation of her heart rate, but no hypoxia noted.  She is also hypertensive.  Did not see any medication to account for hypokalemia.  I discussed this case with family medicine service who referred patient to the emergency department, she will be evaluated and admitted at this time. She is hemodynamically stable.   Dispostion:  After consideration of the diagnostic results and the patients response to treatment, I feel that the patent would benefit from admission for electrolyte derangement.    Portions of this note were generated with Scientist, clinical (histocompatibility and immunogenetics). Dictation errors may occur despite best attempts at proofreading.   Final Clinical Impression(s) / ED Diagnoses Final diagnoses:  Hypocalcemia  Hypokalemia    Rx / DC Orders ED Discharge Orders     None         Claude Manges, PA-C 04/26/23 2209    Virgina Norfolk, DO 04/26/23 2253

## 2023-04-26 NOTE — Patient Instructions (Addendum)
It was great to see you! Thank you for allowing me to participate in your care!  I recommend that you always bring your medications to each appointment as this makes it easy to ensure we are on the correct medications and helps [place] not miss when refills are needed.  Our plans for today:  - Low Calcium (Hypocalcemia) -Rechecking labs to see if your low calcium is true/possible cause -I will call you and update you with a plan, based on the results.  -EKG is slightly concerning, (you have a long Qtc interval), this can be coming from low calcium  If calcium is low, will send you to the Emergency Department   We are checking some labs today, I will call you if they are abnormal will send you a MyChart message or a letter if they are normal.  If you do not hear about your labs in the next 2 weeks please let us know.  Take care and seek immediate care sooner if you develop any concerns.   Dr. Bess Kinds, MD [facility]

## 2023-04-26 NOTE — Telephone Encounter (Signed)
Received critical lab result page from [facility] with Calcium of 6.7. This was a BMP so no albumin was collected alongside this. Has been having vomiting on and off for about a month with some mild abdominal pain. No fevers. Has been very tired over the past month. Mild twitching in muscles on her body occasionally. Occasionally has palpitations. Nothing right now. We have scheduled her to be seen in ATC tomorrow at 4:10 as this is the only time she can come in. Will route to PCP and Provider who last saw her. Return precautions discussed-consider pancreatitis although this has been ongoing for 1 month. Would recommend checking albumin/PTH/repeat Ca.

## 2023-04-26 NOTE — Telephone Encounter (Signed)
Patient calls nurse line this morning requesting a sooner apt.   Apt scheduled for 950 am in ATC.

## 2023-04-26 NOTE — H&P (Shared)
Hospital Admission History and Physical Service Pager: [phone]  Patient name: Norma Ramsey Medical record number: [id number] Date of Birth: Oct 08, 1985 Age: 37 y.o. Gender: female  Primary Care Provider: Westley Chandler, MD Consultants: None Code Status: FULL code Preferred Emergency Contact:   Chief Complaint: Fatigue, muscle cramps  Assessment and Plan: Norma Ramsey is a 37 y.o. female PMH of HTN and diabetes presenting with significant electrolyte abnormalities including hypocalcemia, hypokalemia and hypomagnesemia.   Hypocalcemia Hypokalemia/Hypomagnesemia DDx-  Less likely to be pancreatitis given negative lipase, no LUQ abdominal pain GI loss/malabsorptive process possible given intermittent diarrhea although not daily she does have vomiting daily for the past month Renal insufficiency/electrolyte loss although her creatinine appears normal she is not on any diuretics No signs of acute sepsis or infection although does have leukocytosis, intermittent dry cough for 1 month, upper midline abdominal pain Hypoparathyroidism although PTH was normal still a possibility (although does not completely match with lab values given normal phosphorus) 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 Cancer/immunodeficiency on differential as well Tumor lysis syndrome unlikely given low potassium and normal phosphorus. Possibly from GI/renal losses? ?Gitelman syndrome given hypokalemia, metabolic alkalosis, hypomagnesemia, and hyperaldosteronism lytes? Bartter syndrome?  -      Hospital     * (Principal) Electrolyte abnormality     Significant hypocalcemia (CoCa 7.3), hypomagnesemia (1.1) and  hypokalemia (2.7>2.4) on admission. Vague symptoms including muscle  cramping, perioral numbness, fatigue, diarrhea and vomiting for the past  month and a half. Unclear cause - GI loss/Poor oral intake vs.  Hypoparathyroidism vs. Renal  wasting Malignancy vs. Immunosuppression vs.  Infection. Some anterior neck fullness/adenopathy on my exam. See  differential discussion above. -Admit to FMTS, attending Dr. Manson Passey, med/tele -CT Neck W Contrast -Cardiac monitoring -Replete electrolytes PRN -Avoid medications that would drop electrolytes (diuretics,  aminoglycosides) -Regular diet -LR mIVF for 12 hours (reassess with AM electrolytes) -Consider endocrinology consult -Consider nephrology consult vs urinary electrolytes -Obtain VBG to assess acidosis v. Alkalosis -Uca/Cr -AM CBC with differential, CMP, A1c, HIV, mag, phos, vitamin D        Essential hypertension     Blood pressure is elevated to 150s to 160s SBP while in room.  -Continue home amlodipine 10 mg daily -Irbesartan 37.5 mg daily in place of home olmesartan 20 mg daily         Intermittent palpitations     Ongoing for the past month.  Likely exacerbated by electrolyte  abnormalities. -Cardiac monitoring -Replete electrolytes as needed        Diabetes mellitus without complication (HCC)     Last A1c 5.8 three months ago.  Home medication of Trulicity 0.75 mg  weekly-states she has been on this for 2 years. -A.M. A1c -Monitor glucose on labs, if elevated add on AC/HS checks        Elevated alkaline phosphatase level     Elevated alk phos, mild RUQ tenderness (mostly is midline upper  abdominal), nausea/vomiting. Possibly related to  cholestasis/cholecystitis. If cancer mets would expect high calcium.  Hypoparathyroidism usually normal Alk phos. ?Osteomalacia but no  pseudofractures/bone pain. -RUQ u/s -Monitor on CMP        Cough     Dry intermittent cough for month and a half. Active smoker. No sputum  production or hemoptysis. Possible related to reflux? CXR wnl. Consider  post viral cough as well. Saturating well on room air. -Consider additional lung imaging given constellation of  symptoms -hold on PPI for now with electrolyte  abnormalities         Prolonged Q-T interval on ECG     QTc 523 on recent EKG.  -Caution with QT prolonging medications/antiemetics -AM EKG        Vomiting and diarrhea     Intermittent for the past month. No diarrhea today but does have  vomiting daily. No headaches-less likely to be elevated intracranial  pressures but keep on differential. Overall benign abdominal exam.  Frequent marijauna use ?cannabinoid hyperemesis syndrome. Less likely  euglycemic DKA from SGLT2 given no urinary ketones although did have anion  gap of 17. -LR mIVF for 12 hours (reassess with AM electrolytes) -Will trial scopolamine patch -Capsaicin cream -Avoid QT prolonging medications          Macrocytic anemia with vitamin B12 deficiency     Hemoglobin 11.8 with MCV of 109.6.  B12 deficiency.  Patient is not  adherant to B12 supplementation outpatient. -restart B12 supplement -AM CBC        Microscopic hematuria     Urinalysis showed amber appearance, 30 protein, moderate hemoglobin,  11-20 RBCs, 11-20 WBC, negative nitrite. Did have squamous cells, likely  not clean catch. No urinary symptoms, CVA tenderness or suprapubic pain.  Likely related to dehydration. -Recheck in AM after fluids running for sometime -Assess if having any vaginal bleeding      FEN/GI: Regular diet VTE Prophylaxis: Lovenox  Disposition: Med tele  History of Present Illness:  Norma Ramsey is a 37 y.o. female presenting with fatigue, muscle cramping, perioral numbness, vomiting, diarrhea here for multiple electrolyte derangements.  For past month and a half has been having a variety of different symptoms including abdominal pain that is upper and midline, vomiting almost every day for 1 month, diarrhea that comes and goes (not daily) the past month, and then intermittent palpitations.  She denies any shortness of breath however has been having a cough for the last 1 and half months as well.  She denies any  fevers.  She states for the last month she has had a very decreased appetite and will eat a small amount in the morning and then a bag of chips at night and cannot stomach anymore due to feeling really nauseous after eating.  Every morning she wakes up and feels nauseous and sick.  She denies any fevers.  Her emesis appears yellowish in color without any blood.  No relationship of her abdominal pain to food and this does not make it better or worse.  Has been having spasms and tingling sensation in her legs for the past month as well.  Does also state that she has been having some perioral numbness around her mouth for the past month as well.  She denies any unintentional weight loss but states that she is sweating a lot at nighttime for the past 3 weeks.  Denies any neck masses that she has felt.  Denies any trouble swallowing.  She was seen in [facility] on 7/22 for intermittent dizziness, palpitations and vision changes.  Laboratory work was obtained at that time and I had called her yesterday morning due to critical calcium value from [facility].  She was seen again in [facility] clinic 7/24 where she had palpitations and skipped beats as well as muscle aches and cramps and was told to go to the emergency department.  CMP from clinic appointment showed corrected calcium of 6.6 and hypokalemia to 3.4.  PTH was obtained which  was normal at 55.  In the ED, urinalysis showed amber appearance, 30 protein, moderate hemoglobin, 11-20 RBCs, 11-20 WBC, negative nitrite.  Lipase was normal at 38.  CMP showed significant hypokalemia to 2.7 with repeat of 2.4, corrected calcium of 7.3 and elevated alkaline phosphatase to 150 with anion gap of 17.  Magnesium of 1.1.  WBC elevated to 14.3, hemoglobin of 11.8 and elevated platelets to 481.  Negative urine pregnancy.  Review Of Systems: Per HPI with the following additions: Denies any headaches, vision changes (some intermittent floaters), chest pain, shortness of breath, dysuria or  frequency  Positive for night sweats, loss of appetite, nausea, vomiting, intermittent diarrhea, upper abdominal pain  Pertinent Past Medical History: HTN  Reflux Remainder reviewed in history tab.   Pertinent Past Surgical History: C-section  Remainder reviewed in history tab.   Pertinent Social History: Tobacco use: had heavy use at beginning 1-2 cigarettes a day since 37 years old Alcohol use: Yesterday last drink, 1-2 shots daily, no hx of DT or hx of withdrawal Other Substance use: Smoking marijuana Lives with mom right now awaiting new apartment   Pertinent Family History: Mom-cervical cancer, no hx of thyroid parathyroid or known endocrine cancers Both parents high BP, prediabetes  Remainder reviewed in history tab.   Important Outpatient Medications: Amlodipine Olmesartan Trulicity started 2 years ago for prediabetes Pepcid occasionally-not helpful  No multivitamin  Remainder reviewed in medication history.   Objective: BP (!) 144/94 (BP Location: Left Arm)   Pulse 93   Temp 98 F (36.7 C) (Oral)   Resp 17   Ht 5\' 3"  (1.6 m)   SpO2 95%   BMI 49.99 kg/m  Exam: General: NAD, awake, alert, responsive to all questions Eyes: Sclera nonicteric, pupils equal ENTM: Mildly dry mucous membranes, negative Chvostek sign Neck: Fullness to anterior neck, bilateral lymphadenopathy present, no thyroid masses palpated however slightly enlarged on my examination Cardiovascular: Regular rate and rhythm, no murmurs or gallops, well-perfused extremities Respiratory: Clear to auscultation bilaterally, no wheezes rales or crackles Gastrointestinal: Soft, mildly tender to palpation in midline epigastrium, mild tenderness to palpation in right upper quadrant but no significant Murphy sign present, no CVA tenderness, no left upper quadrant tenderness, normoactive bowel sounds MSK: Trace pedal edema, no calf tenderness Derm: No rashes Neuro: Alert and responsive to all  questions Psych: Mood appropriate, pleasant  Labs:  CBC BMET  Recent Labs  Lab 04/27/23 0301  WBC 11.1*  HGB 10.1*  HCT 28.2*  PLT 391   Recent Labs  Lab 04/27/23 0301  NA 138  K 2.6*  CL 100  CO2 23  BUN 5*  CREATININE 0.59  GLUCOSE 131*  CALCIUM 6.1*     Mag 1.1 PTH 55 UA amber, 30 protein, moderate hemoglobin, 11-20 RBCs, 11-20 WBC, negative nitrite.   Lipase 38 Alk phos 150   EKG: flattening of the T waves (hypokalemia to 2.4), QTc 531   Imaging Studies Performed: CXR-within normal limits    Levin Erp, MD 04/27/2023, 6:51 AM PGY-3, [facility]  FPTS Intern pager: [phone], text pages welcome Secure chat group [facility] Teaching Service

## 2023-04-26 NOTE — ED Notes (Signed)
Pt gone to Xray 

## 2023-04-26 NOTE — Assessment & Plan Note (Addendum)
Patient comes in for concern of hypocalcemia.  Patient notes she has felt off for the last month and a half, and came in for a visit on Monday, to discuss. Labs were drawn on Tuesday, and resulted overnight, showing hypocalcemia to a level of 6.7.  Patient was called that night and asked to make a appointment for this morning.  Patient was seen in clinic this morning patient reports history of muscle aches and cramps, sporadically, that can last all day.  Patient also complained of racing heart rate/palpitations, EKG in clinic today showed prolonged Qtc of 526. Otherwise denies any signs symptoms of hallucinations, confusion, numbness or tingling.    Patient calcium level rechecked and was 6.5 with a normal albumin, and normal PTH. Patient was called and instructed to go to ED for evaluation/possible admission. -F/u in ED for evaluation and possible admission.

## 2023-04-26 NOTE — ED Notes (Signed)
ED TO INPATIENT HANDOFF REPORT  ED Nurse Name and Phone #: [phone]  S Name/Age/Gender Norma Ramsey Norma Ramsey 37 y.o. female Room/Bed: 018C/018C  Code Status   Code Status: Prior  Home/SNF/Other Home Patient oriented to: self, place, time, and situation Is this baseline? Yes   Triage Complete: Triage complete  Chief Complaint Electrolyte abnormality [E87.8]  Triage Note The pt saw her doctor yesterday for fatigue and not feeling well for one month  she was called back to that office today telling her that her calcium was low  lmp implant  Abd pain also appetite low   Allergies No Known Allergies  Level of Care/Admitting Diagnosis ED Disposition     ED Disposition  Admit   Condition  --   Comment  Hospital Area: [facility] [[id number]]  Level of Care: Telemetry Medical [104]  May place patient in observation at [facility] or [facility] if equivalent level of care is available:: No  Covid Evaluation: Asymptomatic - no recent exposure (last 10 days) testing not required  Diagnosis: Electrolyte abnormality [454098]  Admitting Physician: Levin Erp [[id number]]  Attending Physician: Westley Chandler [[id number]]          Ramsey Medical/Surgery History Past Medical History:  Diagnosis Date   Acute pain of left knee 06/03/2020   Hypertension    Obesity    Prediabetes    Tobacco abuse    Past Surgical History:  Procedure Laterality Date   INSERTION OF IMPLANON ROD  05/09/2022   REMOVAL OF IMPLANON ROD  05/09/2022   WISDOM TOOTH EXTRACTION  09/2016     A IV Location/Drains/Wounds Patient Lines/Drains/Airways Status     Active Line/Drains/Airways     Name Placement date Placement time Site Days   Peripheral IV 04/26/23 20 G Right Antecubital 04/26/23  2120  Antecubital  less than 1            Intake/Output Last 24 hours No intake or output data in the 24 hours ending 04/26/23 2226  Labs/Imaging Results for orders placed or performed during the  hospital encounter of 04/26/23 (from the past 48 hour(s))  Urinalysis, Routine w reflex microscopic -Urine, Clean Catch     Status: Abnormal   Collection Time: 04/26/23  7:47 PM  Result Value Ref Range   Color, Urine AMBER (A) YELLOW    Comment: BIOCHEMICALS MAY BE AFFECTED BY COLOR   APPearance HAZY (A) CLEAR   Specific Gravity, Urine 1.021 1.005 - 1.030   pH 6.0 5.0 - 8.0   Glucose, UA NEGATIVE NEGATIVE mg/dL   Hgb urine dipstick MODERATE (A) NEGATIVE   Bilirubin Urine NEGATIVE NEGATIVE   Ketones, ur NEGATIVE NEGATIVE mg/dL   Protein, ur 30 (A) NEGATIVE mg/dL   Nitrite NEGATIVE NEGATIVE   Leukocytes,Ua TRACE (A) NEGATIVE   RBC / HPF 11-20 0 - 5 RBC/hpf   WBC, UA 11-20 0 - 5 WBC/hpf   Bacteria, UA FEW (A) NONE SEEN   Squamous Epithelial / HPF 6-10 0 - 5 /HPF   Mucus PRESENT     Comment: Performed at [facility], [address] [place], [place] [address]  Lipase, blood     Status: None   Collection Time: 04/26/23  7:51 PM  Result Value Ref Range   Lipase 38 11 - 51 U/L    Comment: Performed at [facility], [address] [place], [place] [address]  Comprehensive metabolic panel     Status: Abnormal   Collection Time:  04/26/23  7:51 PM  Result Value Ref Range   Sodium 140 135 - 145 mmol/L   Potassium 2.7 (LL) 3.5 - 5.1 mmol/L    Comment: CRITICAL RESULT CALLED TO, READ BACK BY AND VERIFIED WITH Rolla Etienne, RN. 2348 04/26/23. LPAIT   Chloride 97 (L) 98 - 111 mmol/L   CO2 26 22 - 32 mmol/L   Glucose, Bld 149 (H) 70 - 99 mg/dL    Comment: Glucose reference range applies only to samples taken after fasting for at least 8 hours.   BUN 5 (L) 6 - 20 mg/dL   Creatinine, Ser 1.61 0.44 - 1.00 mg/dL   Calcium 6.6 (L) 8.9 - 10.3 mg/dL   Total Protein 7.2 6.5 - 8.1 g/dL   Albumin 3.1 (L) 3.5 - 5.0 g/dL   AST 36 15 - 41 U/L   ALT 19 0 - 44 U/L   Alkaline Phosphatase 150 (H) 38 - 126 U/L   Total Bilirubin 1.0 0.3 - 1.2 mg/dL   GFR, Estimated >09 >60 mL/min    Comment:  (NOTE) Calculated using the CKD-EPI Creatinine Equation (2021)    Anion gap 17 (H) 5 - 15    Comment: Performed at [facility], [address], [place], [place] [address]  CBC     Status: Abnormal   Collection Time: 04/26/23  7:51 PM  Result Value Ref Range   WBC 14.3 (H) 4.0 - 10.5 K/uL   RBC 3.01 (L) 3.87 - 5.11 MIL/uL   Hemoglobin 11.8 (L) 12.0 - 15.0 g/dL   HCT 81.1 (L) 91.4 - 78.2 %   MCV 109.6 (H) 80.0 - 100.0 fL   MCH 39.2 (H) 26.0 - 34.0 pg   MCHC 35.8 30.0 - 36.0 g/dL   RDW 95.6 21.3 - 08.6 %   Platelets 481 (H) 150 - 400 K/uL   nRBC 0.0 0.0 - 0.2 %    Comment: Performed at [facility], [address], [place], [place] [address]  hCG, serum, qualitative     Status: None   Collection Time: 04/26/23  7:51 PM  Result Value Ref Range   Preg, Serum NEGATIVE NEGATIVE    Comment:        THE SENSITIVITY OF THIS METHODOLOGY IS >10 mIU/mL. Performed at [facility], [address], [place], [place] [address]   Magnesium     Status: Abnormal   Collection Time: 04/26/23  7:51 PM  Result Value Ref Range   Magnesium 1.1 (L) 1.7 - 2.4 mg/dL    Comment: Performed at [facility], [address], [place], [place] [address]  Potassium     Status: Abnormal   Collection Time: 04/26/23  9:16 PM  Result Value Ref Range   Potassium 2.4 (LL) 3.5 - 5.1 mmol/L    Comment: CRITICAL RESULT CALLED TO, READ BACK BY AND VERIFIED WITH Rolla Etienne, RN. 2209 04/26/23. LPAIT Performed at [facility], [address], [place], [place] [address]    DG Chest 2 View  Result Date: 04/26/2023 CLINICAL DATA:  Weakness EXAM: CHEST - 2 VIEW COMPARISON:  Chest x-ray 07/19/2021 FINDINGS: The heart size and mediastinal contours are within normal limits. Both lungs are clear. The visualized skeletal structures are unremarkable. IMPRESSION: No active cardiopulmonary disease. Electronically Signed   By: Darliss Cheney M.D.   On: 04/26/2023 21:47    Pending Labs Unresulted Labs (From  admission, onward)     Start  Ordered   04/26/23 2115  Calcium, ionized  Once,   STAT        04/26/23 2115            Vitals/Pain Today's Vitals   04/26/23 1942 04/26/23 1947 04/26/23 2120 04/26/23 2226  BP:  (!) 184/148 (!) 160/112 (!) 154/101  Pulse:  (!) 124 100 91  Resp:  18 (!) 24 18  Temp:  98.6 F (37 C)  99.9 F (37.7 C)  TempSrc:    Oral  SpO2:  100% 100% 100%  PainSc: 7        Isolation Precautions No active isolations  Medications Medications  potassium chloride 10 mEq in 100 mL IVPB (10 mEq Intravenous New Bag/Given 04/26/23 2222)  potassium chloride SA (KLOR-CON M) CR tablet 40 mEq (has no administration in time range)  magnesium sulfate IVPB 4 g 100 mL (has no administration in time range)    Mobility walks     Focused Assessments Cardiac Assessment Handoff:    No results found for: "CKTOTAL", "CKMB", "CKMBINDEX", "TROPONINI" Lab Results  Component Value Date   DDIMER 0.43 04/10/2017   Does the Patient currently have chest pain? No    R Recommendations: See Admitting Provider Note  Report given to:   Additional Notes: none

## 2023-04-26 NOTE — ED Triage Notes (Signed)
The pt saw her doctor yesterday for fatigue and not feeling well for one month  she was called back to that office today telling her that her calcium was low  lmp implant

## 2023-04-26 NOTE — Progress Notes (Signed)
  SUBJECTIVE:   CHIEF COMPLAINT / HPI:   Hypocalcemia Patient was called overnight for a lab value that was concerning for low calcium. She reports for the last 1.5 months she's felt strange/off. She notes her HR will go gast/skip beats sometimes, and she just feels funny. She also notes som mucle aches and cramps more frequently, that can happen every other day or so, and last the whole day. She is otherwise feeling fine, w/o systemic symptoms. She denies any hallucinations or confusion.    PERTINENT  PMH / PSH:    Patient Care Team: Westley Chandler, MD as PCP - General (Family Medicine) OBJECTIVE:  BP (!) 168/112   Pulse 99   Ht 5\' 3"  (1.6 m)   Wt 282 lb 3.2 oz (128 kg)   SpO2 98%   BMI 49.99 kg/m  Physical Exam Constitutional:      General: She is not in acute distress.    Appearance: Normal appearance. She is not ill-appearing.  Cardiovascular:     Rate and Rhythm: Normal rate and regular rhythm.     Pulses: Normal pulses.     Heart sounds: Normal heart sounds. No murmur heard.    No friction rub. No gallop.  Pulmonary:     Effort: Pulmonary effort is normal. No respiratory distress.     Breath sounds: Normal breath sounds. No stridor. No wheezing, rhonchi or rales.  Neurological:     Mental Status: She is alert.  Psychiatric:        Mood and Affect: Mood normal.        Behavior: Behavior normal.      ASSESSMENT/PLAN:  Hypocalcemia Assessment & Plan: Patient comes in for concern of hypocalcemia.  Patient notes she has felt off for the last month and a half, and came in for a visit on Monday, to discuss. Labs were drawn on Tuesday, and resulted overnight, showing hypocalcemia to a level of 6.7.  Patient was called that night and asked to make a appointment for this morning.  Patient was seen in clinic this morning patient reports history of muscle aches and cramps, sporadically, that can last all day.  Patient also complained of racing heart rate/palpitations, EKG in  clinic today showed prolonged QTc. Otherwise denies any signs symptoms of hallucinations, confusion, numbness or tingling.    Patient calcium level rechecked and was 6.5 with a normal albumin, and normal PTH. Patient was called and instructed to go to ED for evaluation. -F/u in ED for evaluation and possible admission.   Orders: -     EKG 12-Lead -     Comprehensive metabolic panel -     PTH, intact and calcium   No follow-ups on file. Bess Kinds, MD 04/26/2023, 3:42 PM PGY-3, [facility]

## 2023-04-27 ENCOUNTER — Other Ambulatory Visit: Payer: Self-pay

## 2023-04-27 ENCOUNTER — Encounter (HOSPITAL_COMMUNITY): Payer: Self-pay | Admitting: Student

## 2023-04-27 ENCOUNTER — Ambulatory Visit: Payer: Self-pay

## 2023-04-27 ENCOUNTER — Observation Stay (HOSPITAL_COMMUNITY): Payer: 59

## 2023-04-27 ENCOUNTER — Inpatient Hospital Stay (HOSPITAL_COMMUNITY): Payer: 59

## 2023-04-27 DIAGNOSIS — R002 Palpitations: Secondary | ICD-10-CM | POA: Diagnosis present

## 2023-04-27 DIAGNOSIS — R748 Abnormal levels of other serum enzymes: Secondary | ICD-10-CM | POA: Insufficient documentation

## 2023-04-27 DIAGNOSIS — E878 Other disorders of electrolyte and fluid balance, not elsewhere classified: Secondary | ICD-10-CM | POA: Diagnosis not present

## 2023-04-27 DIAGNOSIS — Z8249 Family history of ischemic heart disease and other diseases of the circulatory system: Secondary | ICD-10-CM | POA: Diagnosis not present

## 2023-04-27 DIAGNOSIS — I10 Essential (primary) hypertension: Secondary | ICD-10-CM | POA: Diagnosis not present

## 2023-04-27 DIAGNOSIS — Z79899 Other long term (current) drug therapy: Secondary | ICD-10-CM | POA: Diagnosis not present

## 2023-04-27 DIAGNOSIS — R1110 Vomiting, unspecified: Secondary | ICD-10-CM | POA: Diagnosis not present

## 2023-04-27 DIAGNOSIS — E876 Hypokalemia: Secondary | ICD-10-CM | POA: Diagnosis not present

## 2023-04-27 DIAGNOSIS — E860 Dehydration: Secondary | ICD-10-CM | POA: Diagnosis not present

## 2023-04-27 DIAGNOSIS — D518 Other vitamin B12 deficiency anemias: Secondary | ICD-10-CM | POA: Diagnosis not present

## 2023-04-27 DIAGNOSIS — E874 Mixed disorder of acid-base balance: Secondary | ICD-10-CM | POA: Diagnosis not present

## 2023-04-27 DIAGNOSIS — R197 Diarrhea, unspecified: Secondary | ICD-10-CM | POA: Diagnosis not present

## 2023-04-27 DIAGNOSIS — D7589 Other specified diseases of blood and blood-forming organs: Secondary | ICD-10-CM | POA: Diagnosis present

## 2023-04-27 DIAGNOSIS — D539 Nutritional anemia, unspecified: Secondary | ICD-10-CM | POA: Diagnosis not present

## 2023-04-27 DIAGNOSIS — R3129 Other microscopic hematuria: Secondary | ICD-10-CM | POA: Insufficient documentation

## 2023-04-27 DIAGNOSIS — D510 Vitamin B12 deficiency anemia due to intrinsic factor deficiency: Secondary | ICD-10-CM | POA: Diagnosis present

## 2023-04-27 DIAGNOSIS — K219 Gastro-esophageal reflux disease without esophagitis: Secondary | ICD-10-CM | POA: Diagnosis present

## 2023-04-27 DIAGNOSIS — R1084 Generalized abdominal pain: Secondary | ICD-10-CM | POA: Diagnosis present

## 2023-04-27 DIAGNOSIS — R109 Unspecified abdominal pain: Secondary | ICD-10-CM | POA: Diagnosis not present

## 2023-04-27 DIAGNOSIS — E669 Obesity, unspecified: Secondary | ICD-10-CM | POA: Diagnosis not present

## 2023-04-27 DIAGNOSIS — R9431 Abnormal electrocardiogram [ECG] [EKG]: Secondary | ICD-10-CM | POA: Insufficient documentation

## 2023-04-27 DIAGNOSIS — K909 Intestinal malabsorption, unspecified: Secondary | ICD-10-CM | POA: Diagnosis not present

## 2023-04-27 DIAGNOSIS — Z6841 Body Mass Index (BMI) 40.0 and over, adult: Secondary | ICD-10-CM | POA: Diagnosis not present

## 2023-04-27 DIAGNOSIS — E11649 Type 2 diabetes mellitus with hypoglycemia without coma: Secondary | ICD-10-CM | POA: Diagnosis not present

## 2023-04-27 DIAGNOSIS — E559 Vitamin D deficiency, unspecified: Secondary | ICD-10-CM | POA: Insufficient documentation

## 2023-04-27 DIAGNOSIS — Z833 Family history of diabetes mellitus: Secondary | ICD-10-CM | POA: Diagnosis not present

## 2023-04-27 DIAGNOSIS — R519 Headache, unspecified: Secondary | ICD-10-CM | POA: Diagnosis not present

## 2023-04-27 DIAGNOSIS — R059 Cough, unspecified: Secondary | ICD-10-CM | POA: Insufficient documentation

## 2023-04-27 DIAGNOSIS — F17210 Nicotine dependence, cigarettes, uncomplicated: Secondary | ICD-10-CM | POA: Diagnosis present

## 2023-04-27 DIAGNOSIS — R200 Anesthesia of skin: Secondary | ICD-10-CM | POA: Diagnosis present

## 2023-04-27 DIAGNOSIS — F1210 Cannabis abuse, uncomplicated: Secondary | ICD-10-CM | POA: Diagnosis not present

## 2023-04-27 LAB — HEMOGLOBIN A1C
Hgb A1c MFr Bld: 5.8 % — ABNORMAL HIGH (ref 4.8–5.6)
Mean Plasma Glucose: 119.76 mg/dL

## 2023-04-27 LAB — CBC WITH DIFFERENTIAL/PLATELET
Abs Immature Granulocytes: 0.08 10*3/uL — ABNORMAL HIGH (ref 0.00–0.07)
Basophils Absolute: 0 10*3/uL (ref 0.0–0.1)
Basophils Relative: 0 %
Eosinophils Absolute: 0 10*3/uL (ref 0.0–0.5)
Eosinophils Relative: 0 %
HCT: 28.2 % — ABNORMAL LOW (ref 36.0–46.0)
Hemoglobin: 10.1 g/dL — ABNORMAL LOW (ref 12.0–15.0)
Immature Granulocytes: 1 %
Lymphocytes Relative: 23 %
Lymphs Abs: 2.6 10*3/uL (ref 0.7–4.0)
MCH: 39.3 pg — ABNORMAL HIGH (ref 26.0–34.0)
MCHC: 35.8 g/dL (ref 30.0–36.0)
MCV: 109.7 fL — ABNORMAL HIGH (ref 80.0–100.0)
Monocytes Absolute: 0.5 10*3/uL (ref 0.1–1.0)
Monocytes Relative: 5 %
Neutro Abs: 7.8 10*3/uL — ABNORMAL HIGH (ref 1.7–7.7)
Neutrophils Relative %: 71 %
Platelets: 391 10*3/uL (ref 150–400)
RBC: 2.57 MIL/uL — ABNORMAL LOW (ref 3.87–5.11)
RDW: 14.3 % (ref 11.5–15.5)
WBC: 11.1 10*3/uL — ABNORMAL HIGH (ref 4.0–10.5)
nRBC: 0 % (ref 0.0–0.2)

## 2023-04-27 LAB — COMPREHENSIVE METABOLIC PANEL
ALT: 16 U/L (ref 0–44)
AST: 30 U/L (ref 15–41)
Albumin: 2.6 g/dL — ABNORMAL LOW (ref 3.5–5.0)
Alkaline Phosphatase: 127 U/L — ABNORMAL HIGH (ref 38–126)
Anion gap: 15 (ref 5–15)
BUN: 5 mg/dL — ABNORMAL LOW (ref 6–20)
CO2: 23 mmol/L (ref 22–32)
Calcium: 6.1 mg/dL — CL (ref 8.9–10.3)
Chloride: 100 mmol/L (ref 98–111)
Creatinine, Ser: 0.59 mg/dL (ref 0.44–1.00)
GFR, Estimated: >60 mL/min (ref >60)
Glucose, Bld: 131 mg/dL — ABNORMAL HIGH (ref 70–99)
Potassium: 2.6 mmol/L — CL (ref 3.5–5.1)
Sodium: 138 mmol/L (ref 135–145)
Total Bilirubin: 0.9 mg/dL (ref 0.3–1.2)
Total Protein: 6.3 g/dL — ABNORMAL LOW (ref 6.5–8.1)

## 2023-04-27 LAB — BLOOD GAS, VENOUS
Acid-Base Excess: 4.9 mmol/L — ABNORMAL HIGH (ref 0.0–2.0)
Bicarbonate: 29.2 mmol/L — ABNORMAL HIGH (ref 20.0–28.0)
Drawn by: 7754
O2 Saturation: 87.1 %
Patient temperature: 37.2
pCO2, Ven: 41 mmHg — ABNORMAL LOW (ref 44–60)
pH, Ven: 7.46 — ABNORMAL HIGH (ref 7.25–7.43)
pO2, Ven: 55 mmHg — ABNORMAL HIGH (ref 32–45)

## 2023-04-27 LAB — URINALYSIS, ROUTINE W REFLEX MICROSCOPIC
Bilirubin Urine: NEGATIVE
Glucose, UA: NEGATIVE mg/dL
Ketones, ur: NEGATIVE mg/dL
Leukocytes,Ua: NEGATIVE
Nitrite: NEGATIVE
Protein, ur: NEGATIVE mg/dL
Specific Gravity, Urine: >1.046 — ABNORMAL HIGH (ref 1.005–1.030)
pH: 7 (ref 5.0–8.0)

## 2023-04-27 LAB — VITAMIN D 25 HYDROXY (VIT D DEFICIENCY, FRACTURES): Vit D, 25-Hydroxy: 6.54 ng/mL — ABNORMAL LOW (ref 30–100)

## 2023-04-27 LAB — MAGNESIUM
Magnesium: 1.7 mg/dL (ref 1.7–2.4)
Magnesium: 1.5 mg/dL — ABNORMAL LOW (ref 1.7–2.4)

## 2023-04-27 LAB — TECHNOLOGIST SMEAR REVIEW

## 2023-04-27 LAB — HIV ANTIBODY (ROUTINE TESTING W REFLEX): HIV Screen 4th Generation wRfx: NONREACTIVE

## 2023-04-27 LAB — BASIC METABOLIC PANEL
Anion gap: 10 (ref 5–15)
BUN: 5 mg/dL — ABNORMAL LOW (ref 6–20)
CO2: 25 mmol/L (ref 22–32)
Calcium: 6.8 mg/dL — ABNORMAL LOW (ref 8.9–10.3)
Chloride: 99 mmol/L (ref 98–111)
Creatinine, Ser: 0.57 mg/dL (ref 0.44–1.00)
GFR, Estimated: >60 mL/min (ref >60)
Glucose, Bld: 150 mg/dL — ABNORMAL HIGH (ref 70–99)
Potassium: 3.1 mmol/L — ABNORMAL LOW (ref 3.5–5.1)
Sodium: 134 mmol/L — ABNORMAL LOW (ref 135–145)

## 2023-04-27 LAB — RETICULOCYTES
Immature Retic Fract: 19.5 % — ABNORMAL HIGH (ref 2.3–15.9)
RBC.: 2.83 MIL/uL — ABNORMAL LOW (ref 3.87–5.11)
Retic Count, Absolute: 89.7 10*3/uL (ref 19.0–186.0)
Retic Ct Pct: 3.2 % — ABNORMAL HIGH (ref 0.4–3.1)

## 2023-04-27 LAB — PHOSPHORUS
Phosphorus: 3.9 mg/dL (ref 2.5–4.6)
Phosphorus: 4 mg/dL (ref 2.5–4.6)

## 2023-04-27 LAB — LACTATE DEHYDROGENASE: LDH: 266 U/L — ABNORMAL HIGH (ref 98–192)

## 2023-04-27 LAB — VITAMIN B12: Vitamin B-12: 203 pg/mL (ref 180–914)

## 2023-04-27 MED ORDER — ENOXAPARIN SODIUM 60 MG/0.6ML IJ SOSY
60.0000 mg | PREFILLED_SYRINGE | INTRAMUSCULAR | Status: DC
  Administered 2023-04-27 – 2023-04-28 (×2): 60 mg via SUBCUTANEOUS
  Filled 2023-04-27 (×3): qty 0.6

## 2023-04-27 MED ORDER — IRBESARTAN 150 MG PO TABS: 150.0000 mg | ORAL_TABLET | Freq: Every day | ORAL | Status: DC

## 2023-04-27 MED ORDER — VITAMIN B-12 1000 MCG PO TABS
1000.0000 ug | ORAL_TABLET | Freq: Every day | ORAL | Status: DC
  Administered 2023-04-27 – 2023-04-29 (×3): 1000 ug via ORAL
  Filled 2023-04-27 (×3): qty 1

## 2023-04-27 MED ORDER — AMLODIPINE BESYLATE 10 MG PO TABS
10.0000 mg | ORAL_TABLET | Freq: Every evening | ORAL | Status: DC
  Administered 2023-04-27 – 2023-04-28 (×2): 10 mg via ORAL
  Filled 2023-04-27 (×2): qty 1

## 2023-04-27 MED ORDER — MELATONIN 5 MG PO TABS
5.0000 mg | ORAL_TABLET | Freq: Every day | ORAL | Status: DC
  Administered 2023-04-27 – 2023-04-28 (×2): 5 mg via ORAL
  Filled 2023-04-27 (×2): qty 1

## 2023-04-27 MED ORDER — MAGNESIUM SULFATE 2 GM/50ML IV SOLN
2.0000 g | Freq: Once | INTRAVENOUS | Status: AC
  Administered 2023-04-27: 2 g via INTRAVENOUS
  Filled 2023-04-27: qty 50

## 2023-04-27 MED ORDER — MAGNESIUM SULFATE 4 GM/100ML IV SOLN
4.0000 g | Freq: Once | INTRAVENOUS | Status: AC
  Administered 2023-04-27: 4 g via INTRAVENOUS
  Filled 2023-04-27: qty 100

## 2023-04-27 MED ORDER — IRBESARTAN 75 MG PO TABS
37.5000 mg | ORAL_TABLET | Freq: Every day | ORAL | Status: DC
  Administered 2023-04-27: 37.5 mg via ORAL
  Filled 2023-04-27: qty 0.5

## 2023-04-27 MED ORDER — SCOPOLAMINE 1 MG/3DAYS TD PT72
1.0000 | MEDICATED_PATCH | TRANSDERMAL | Status: DC
  Filled 2023-04-27 (×2): qty 1

## 2023-04-27 MED ORDER — LACTATED RINGERS IV SOLN: INTRAVENOUS | Status: DC

## 2023-04-27 MED ORDER — VITAMIN D (ERGOCALCIFEROL) 1.25 MG (50000 UNIT) PO CAPS
50000.0000 [IU] | ORAL_CAPSULE | ORAL | Status: DC
  Administered 2023-04-27: 50000 [IU] via ORAL
  Filled 2023-04-27: qty 1

## 2023-04-27 MED ORDER — POTASSIUM CHLORIDE CRYS ER 20 MEQ PO TBCR
40.0000 meq | EXTENDED_RELEASE_TABLET | Freq: Once | ORAL | Status: AC
  Administered 2023-04-27: 40 meq via ORAL
  Filled 2023-04-27: qty 2

## 2023-04-27 MED ORDER — POTASSIUM CHLORIDE CRYS ER 20 MEQ PO TBCR
40.0000 meq | EXTENDED_RELEASE_TABLET | ORAL | Status: AC
  Administered 2023-04-27 (×2): 40 meq via ORAL
  Filled 2023-04-27 (×2): qty 2

## 2023-04-27 MED ORDER — IRBESARTAN 150 MG PO TABS
150.0000 mg | ORAL_TABLET | Freq: Every day | ORAL | Status: DC
  Administered 2023-04-27: 150 mg via ORAL
  Filled 2023-04-27 (×2): qty 1

## 2023-04-27 MED ORDER — CALCIUM CARBONATE 1250 (500 CA) MG PO TABS
1000.0000 mg | ORAL_TABLET | ORAL | Status: AC
  Administered 2023-04-27 (×2): 2500 mg via ORAL
  Filled 2023-04-27 (×2): qty 2

## 2023-04-27 MED ORDER — POTASSIUM CHLORIDE 10 MEQ/100ML IV SOLN
INTRAVENOUS | Status: AC
  Filled 2023-04-27: qty 100

## 2023-04-27 MED ORDER — CALCIUM GLUCONATE-NACL 2-0.675 GM/100ML-% IV SOLN: 2.0000 g | Freq: Once | INTRAVENOUS | Status: DC

## 2023-04-27 MED ORDER — ACETAMINOPHEN 650 MG RE SUPP: 650.0000 mg | Freq: Four times a day (QID) | RECTAL | Status: DC | PRN

## 2023-04-27 MED ORDER — IOHEXOL 350 MG/ML SOLN
75.0000 mL | Freq: Once | INTRAVENOUS | Status: AC | PRN
  Administered 2023-04-27: 75 mL via INTRAVENOUS

## 2023-04-27 MED ORDER — CAPSAICIN 0.025 % EX CREA
TOPICAL_CREAM | Freq: Two times a day (BID) | CUTANEOUS | Status: DC
  Administered 2023-04-27: 1 via TOPICAL
  Filled 2023-04-27 (×2): qty 60

## 2023-04-27 MED ORDER — ENOXAPARIN SODIUM 40 MG/0.4ML IJ SOSY: 40.0000 mg | PREFILLED_SYRINGE | INTRAMUSCULAR | Status: DC

## 2023-04-27 MED ORDER — ACETAMINOPHEN 325 MG PO TABS
650.0000 mg | ORAL_TABLET | Freq: Four times a day (QID) | ORAL | Status: DC | PRN
  Administered 2023-04-28: 650 mg via ORAL
  Filled 2023-04-27: qty 2

## 2023-04-27 MED ORDER — POTASSIUM CHLORIDE 10 MEQ/100ML IV SOLN
10.0000 meq | INTRAVENOUS | Status: AC
  Administered 2023-04-27 (×4): 10 meq via INTRAVENOUS
  Filled 2023-04-27 (×4): qty 100

## 2023-04-27 NOTE — Assessment & Plan Note (Addendum)
Ongoing for the past month.  Likely exacerbated by electrolyte abnormalities. Felt like her heart was palpitating overnight as well. Tachycardic this morning.  - Cardiac monitoring - Replete electrolytes as needed

## 2023-04-27 NOTE — Assessment & Plan Note (Addendum)
Intermittent for the past month. No diarrhea today but does have vomiting daily. No headaches-less likely to be elevated intracranial pressures but keep on differential. Overall benign abdominal exam. Frequent marijauna use, could consider cannabinoid hyperemesis syndrome. Less likely euglycemic DKA from SGLT2 given no urinary ketones although did have anion gap of 17. No episodes of vomiting overnight, but she did have a large watery bowel movement.  - Continue scopolamine patch - Capsaicin cream to apply to upper abdomen to help with nausea - Avoid QT prolonging medications

## 2023-04-27 NOTE — Assessment & Plan Note (Addendum)
Blood pressure is elevated to 140s-160s/90-100s. - Continue Home amlodipine 10 mg daily - Irbesartan 150 mg daily in place of home olmesartan 20 mg daily

## 2023-04-27 NOTE — Assessment & Plan Note (Addendum)
Significant hypocalcemia (CoCa 7.3), hypomagnesemia (1.1) and hypokalemia (2.7>2.4) on admission. Vague symptoms including muscle cramping, perioral numbness, fatigue, diarrhea and vomiting for the past month and a half. Unclear cause - GI loss/Poor oral intake vs. Malabsorption vs. IBD vs. Renal wasting Malignancy vs. Immunosuppression vs. Infection. Some anterior neck fullness/adenopathy.  - Fecal calprotectin pending - Celiac testing with Glia, tTg, IgA pending - Uca/Cr pending - CT Neck W Contrast pending - Cardiac monitoring - Replete electrolytes PRN - Avoid medications that would drop electrolytes (diuretics, aminoglycosides) - Consult Endo (Dr. Sharl Ma), appreciate recs - Will consider GI consult tomorrow pending labs - Consider nephrology consult pending labs

## 2023-04-27 NOTE — Progress Notes (Signed)
Patient's BP was 179/109. Patient denies headache, blurry vision or vision changes. Levin Erp, MD was notified through secure chat.  Norma Ramsey

## 2023-04-27 NOTE — Assessment & Plan Note (Addendum)
Hemoglobin 11.8 with MCV of 109.6. History of Vit B12 deficiency, last checked 3 months ago, was 245 and improved from 145 a year ago. Patient is not adherant to B12 supplementation outpatient. - B12 pending - Blood smear pending

## 2023-04-27 NOTE — Assessment & Plan Note (Addendum)
Elevated alk phos, mild RUQ tenderness (mostly is midline upper abdominal), nausea/vomiting. Possibly related to cholestasis/cholecystitis. If cancer mets would expect high calcium. Hypoparathyroidism usually normal Alk phos. Considering osteomalacia but no pseudofractures/bone pain. RUQ [place] was negative. - Monitor on CMP

## 2023-04-27 NOTE — Progress Notes (Addendum)
Transition of Care [facility]) - Inpatient Brief Assessment   Patient Details  Name: Norma Ramsey MRN: [id number] Date of Birth: 15-May-1988  Transition of [facility]) CM/SW Contact:    Janae Bridgeman, RN Phone Number: 04/27/2023, 8:47 AM   Clinical Narrative: Patient admitted for Electrolyte imbalance.  No TOC needs determined at this time.  Resources will be provided in the AVS for smoking cessation.  Educational resources provided regarding marijuana misuse information.  04/27/23 1345 - CM met with the patient at the bedside to discuss TOC needs.  The patient currently lives with her mother at her home at this time.  The patient plans to move to her new apartment on Saturday, 04/29/23 with her 65 year old son.  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 Homeless shelter resources were removed from the resources since the patient lives with family and has a pending move to an apartment.  I did speak with the patient regarding the health hazards regarding smoking and marijuana use.  Both resources included in the AVS to refer to for education.   Transition of Care Asessment: Insurance and Status: (P) Insurance coverage has been reviewed Patient has primary care physician: (P) Yes Home environment has been reviewed: (P) Yes Prior level of function:: (P) Independent Prior/Current Home Services: (P) No current home services Social Determinants of Health Reivew: (P) SDOH reviewed no interventions necessary Readmission risk has been reviewed: (P) Yes Transition of care needs: (P) no transition of care needs at this time

## 2023-04-27 NOTE — Progress Notes (Addendum)
Discussed this patient's care with Dr. Sharl Ma of [facility].  Reviewed her clinical course and lab values. He shares our suspicion for a possible GI/malabsorptive cause of her manifold lab abnormalities: hypoCa, hypoK, hypoMg, hypovitaminosis D. He shares that a vitamin D level <8.0 is low enough to be causative of her hypoCa but it is worth investigating why she has such profound hypovitaminosis D. Regarding her inappropriately normal PTH, Dr. Sharl Ma feels this may be primarily driven by her hypomagnesemia as HypoMg can inhibit PTH release. Taken together with her macrocytosis which may be attributed to low B12 (level pending at this time), this picture suggests malabsorption. Renal tubular disorders remain on the differential but would not explain the low Vit D or macrocytosis.  - tTG IgA, total IgA - Fecal Calprotectin - Smear review and B12 pending for workup of her macrocytosis - Urine Ca pending at this time - No further endocrine workup at this time  - If the above workup is unrevealing, may need to reach out to GI +/- nephrology for assistance   Appreciate Dr. Daune Perch assistance with this interesting patient.   Eliezer Mccoy, MD 04/27/23 1:42 PM

## 2023-04-27 NOTE — Assessment & Plan Note (Addendum)
QTc 523 on recent EKG likely in the setting of hypokalemia. Remains prolonged on EKG this morning, but improved to 504 likely due to repletion of K.  - Caution with QT prolonging medications/antiemetics - PM EKG

## 2023-04-27 NOTE — Assessment & Plan Note (Addendum)
Dry intermittent cough for month and a half. Active smoker. No sputum production or hemoptysis. Possible related to reflux? CXR wnl. Consider post viral cough as well. Saturating well on room air. - Consider additional lung imaging given constellation of symptoms - Hold on PPI for now with electrolyte abnormalities

## 2023-04-27 NOTE — Progress Notes (Signed)
Patient BP elevated 170/100, message MD Marisue Humble regarding patient BP being elevated for most of the day and asked for PRN IV blood pressure medicine. MD stated it's okay to give Norvasc 10 mg PP due at 1800 and continue to monitor BP overnight. No IV BP meds ordered at this time per MD. Will relay message to oncoming nurse for the night.

## 2023-04-27 NOTE — Assessment & Plan Note (Addendum)
Urinalysis showed amber appearance, 30 protein, moderate hemoglobin, 11-20 RBCs, 11-20 WBC, negative nitrite. Did have squamous cells, likely not clean catch. No urinary symptoms, CVA tenderness or suprapubic pain. Likely related to dehydration. Denies lack of dysuria, increased frequency, or vaginal bleeding.  - Clean catch UA pending d/t to hematuria seen in initial UA

## 2023-04-27 NOTE — Assessment & Plan Note (Addendum)
Last A1c 5.8 three months ago.  Home medication of Trulicity 0.75 mg weekly-states she has been on this for 2 years. - A1c pending - Monitor glucose on labs, if elevated add on AC/HS checks

## 2023-04-27 NOTE — Progress Notes (Signed)
Daily Progress Note Intern Pager: [phone]  Patient name: Norma Ramsey Medical record number: [id number] Date of birth: 20-Jul-1986 Age: 37 y.o. Gender: female  Primary Care Provider: Westley Chandler, MD Consultants: Endocrine Code Status: Full Code  Pt Overview and Major Events to Date:  7/25: Admitted  Assessment and Plan: Norma Ramsey is a 37 y.o. female PMH of HTN and diabetes presenting with significant electrolyte abnormalities including hypocalcemia, hypokalemia and hypomagnesemia.   Hypocalcemia could be secondary to her Vitamin D deficiency likely in the setting of GI losses/malabsorption with her numerous bowel movements and episodes of vomiting.   Hypomagnesemia and hypokalemic metabolic alkalosis could also be caused by Gitelman syndrome. Still awaiting urine calcium results.   IBS vs IBD due to malabsorption could also be considered due to the severity of her bowel movements and episodes of vomiting over the past month.   [facility]     * (Principal) Electrolyte abnormality     Significant hypocalcemia (CoCa 7.3), hypomagnesemia (1.1) and  hypokalemia (2.7>2.4) on admission. Vague symptoms including muscle  cramping, perioral numbness, fatigue, diarrhea and vomiting for the past  month and a half. Unclear cause - GI loss/Poor oral intake vs.  Malabsorption vs. IBD vs. Renal wasting Malignancy vs. Immunosuppression  vs. Infection. Some anterior neck fullness/adenopathy.  - Fecal calprotectin pending - Celiac testing with Glia, tTg, IgA pending - Uca/Cr pending - CT Neck W Contrast pending - Cardiac monitoring - Replete electrolytes PRN - Avoid medications that would drop electrolytes (diuretics,  aminoglycosides) - Consult Endo (Dr. Sharl Ma), appreciate recs - Will consider GI consult tomorrow pending labs - Consider nephrology consult pending labs        Essential hypertension     Blood pressure is elevated to 140s-160s/90-100s. - Continue  Home amlodipine 10 mg daily - Irbesartan 150 mg daily in place of home olmesartan 20 mg daily         Intermittent palpitations     Ongoing for the past month.  Likely exacerbated by electrolyte  abnormalities. Felt like her heart was palpitating overnight as well.  Tachycardic this morning.  - Cardiac monitoring - Replete electrolytes as needed        Diabetes mellitus without complication (HCC)     Last A1c 5.8 three months ago.  Home medication of Trulicity 0.75 mg  weekly-states she has been on this for 2 years. - A1c pending - Monitor glucose on labs, if elevated add on AC/HS checks        Elevated alkaline phosphatase level     Elevated alk phos, mild RUQ tenderness (mostly is midline upper  abdominal), nausea/vomiting. Possibly related to  cholestasis/cholecystitis. If cancer mets would expect high calcium.  Hypoparathyroidism usually normal Alk phos. Considering osteomalacia but  no pseudofractures/bone pain. RUQ [place] was negative. - Monitor on CMP        Cough     Dry intermittent cough for month and a half. Active smoker. No sputum  production or hemoptysis. Possible related to reflux? CXR wnl. Consider  post viral cough as well. Saturating well on room air. - Consider additional lung imaging given constellation of symptoms - Hold on PPI for now with electrolyte abnormalities         Prolonged Q-T interval on ECG     QTc 523 on recent EKG likely in the setting of hypokalemia. Remains  prolonged on EKG this morning, but improved to 504 likely due to  repletion  of K.  - Caution with QT prolonging medications/antiemetics - PM EKG        Vomiting and diarrhea     Intermittent for the past month. No diarrhea today but does have  vomiting daily. No headaches-less likely to be elevated intracranial  pressures but keep on differential. Overall benign abdominal exam.  Frequent marijauna use, could consider cannabinoid hyperemesis syndrome.  Less likely euglycemic DKA  from SGLT2 given no urinary ketones although  did have anion gap of 17. No episodes of vomiting overnight, but she did  have a large watery bowel movement.  - Continue scopolamine patch - Capsaicin cream to apply to upper abdomen to help with nausea - Avoid QT prolonging medications          Macrocytic anemia with vitamin B12 deficiency     Hemoglobin 11.8 with MCV of 109.6. History of Vit B12 deficiency, last  checked 3 months ago, was 245 and improved from 145 a year ago. Patient is  not adherant to B12 supplementation outpatient. - B12 pending - Blood smear pending        Microscopic hematuria     Urinalysis showed amber appearance, 30 protein, moderate hemoglobin,  11-20 RBCs, 11-20 WBC, negative nitrite. Did have squamous cells, likely  not clean catch. No urinary symptoms, CVA tenderness or suprapubic pain.  Likely related to dehydration. Denies lack of dysuria, increased  frequency, or vaginal bleeding.  - Clean catch UA pending d/t to hematuria seen in initial UA        Vitamin D deficiency     Vitamin D level 6.54 today.  Could be causing hypocalcemia likely in  the setting of GI losses/malabsorption with her numerous bowel movements  and episodes of vomiting. - Vitamin D 50,000u weekly       FEN/GI: Regular diet PPx: Lovenox Dispo: pending clinical improvement  Subjective:  Patient is sitting comfortably in bed this morning.  She reports that her this morning she had a large watery bowel movement.  Endorses no episodes of vomiting overnight.  Her only other complaint is diffuse abdominal pain.  Denies headache, chest pain, shortness of breath, any radiation of pain.  Denies dysuria, increased frequency in urination, or vaginal bleeding.  Objective: Temp:  [97.7 F (36.5 C)-99.9 F (37.7 C)] 98 F (36.7 C) (07/25 1115) Pulse Rate:  [91-124] 99 (07/25 1115) Resp:  [17-24] 18 (07/25 1115) BP: (144-184)/(94-148) 166/117 (07/25 1115) SpO2:  [95 %-100 %] 100 %  (07/25 1115) Physical Exam: General: Pleasant, NAD, awake and alert, no papilledema Cardiovascular: RRR. No M/R/G Respiratory: CTAB. Normal WOB on RA. No crackles, wheezing, or rhonchi. Abdomen: Soft, slightly tender diffusely, non-distended. Bowel sounds normoactive. Extremities: Trace pedal edema  Laboratory: Most recent CBC Lab Results  Component Value Date   WBC 11.1 (H) 04/27/2023   HGB 10.1 (L) 04/27/2023   HCT 28.2 (L) 04/27/2023   MCV 109.7 (H) 04/27/2023   PLT 391 04/27/2023   Most recent BMP    Latest Ref Rng & Units 04/27/2023    3:01 AM  BMP  Glucose 70 - 99 mg/dL 035   BUN 6 - 20 mg/dL 5   Creatinine 0.09 - 3.81 mg/dL 8.29   Sodium 937 - 169 mmol/L 138   Potassium 3.5 - 5.1 mmol/L 2.6   Chloride 98 - 111 mmol/L 100   CO2 22 - 32 mmol/L 23   Calcium 8.9 - 10.3 mg/dL 6.1    Albumin: 2.6 Corrected Ca for Hypoalbuminemia:  7.2 Alk Phos: 1.27 A1c: 5.8 PTH: 55 TSH: 1.170 Mag: 1.7 VBG: pH - 7.46, pCO2 - 41, HCO3 - 29.2 Vitamin D: 6.54  Imaging/Diagnostic Tests: RUQ [place]: No gallstones or wall thickening. No focal liver lesion. No etiology for abdominal pain is identified.   Fortunato Curling, DO 04/27/2023, 11:44 AM PGY-1, [facility]  FPTS Intern pager: [phone], text pages welcome Secure chat group [facility] Teaching Service

## 2023-04-27 NOTE — Plan of Care (Signed)

## 2023-04-27 NOTE — Assessment & Plan Note (Signed)
Vitamin D level 6.54 today.  Could be causing hypocalcemia likely in the setting of GI losses/malabsorption with her numerous bowel movements and episodes of vomiting. - Vitamin D 50,000u weekly

## 2023-04-27 NOTE — Assessment & Plan Note (Addendum)
Intermittent for the past month. No diarrhea today but does have vomiting daily. No headaches-less likely to be elevated intracranial pressures but keep on differential. Overall benign abdominal exam. Frequent marijauna use, could consider cannabinoid hyperemesis syndrome. Consider pseudotumor cerebri on ddx due to headaches, some vision changes, nausea, vomiting and some back pain. - Continue scopolamine patch - Capsaicin cream to apply to upper abdomen to help with nausea - Avoid QT prolonging medications  - MRI brain due to suspicion of pseudotumor cerebri

## 2023-04-28 ENCOUNTER — Inpatient Hospital Stay (HOSPITAL_COMMUNITY): Payer: 59

## 2023-04-28 ENCOUNTER — Telehealth: Payer: Self-pay

## 2023-04-28 DIAGNOSIS — D518 Other vitamin B12 deficiency anemias: Secondary | ICD-10-CM

## 2023-04-28 DIAGNOSIS — R197 Diarrhea, unspecified: Secondary | ICD-10-CM

## 2023-04-28 DIAGNOSIS — E876 Hypokalemia: Secondary | ICD-10-CM

## 2023-04-28 DIAGNOSIS — R1110 Vomiting, unspecified: Secondary | ICD-10-CM

## 2023-04-28 DIAGNOSIS — E878 Other disorders of electrolyte and fluid balance, not elsewhere classified: Secondary | ICD-10-CM | POA: Diagnosis not present

## 2023-04-28 LAB — HEPATIC FUNCTION PANEL
ALT: 17 U/L (ref 0–44)
AST: 32 U/L (ref 15–41)
Albumin: 2.9 g/dL — ABNORMAL LOW (ref 3.5–5.0)
Alkaline Phosphatase: 133 U/L — ABNORMAL HIGH (ref 38–126)
Bilirubin, Direct: 0.3 mg/dL — ABNORMAL HIGH (ref 0.0–0.2)
Indirect Bilirubin: 1.1 mg/dL — ABNORMAL HIGH (ref 0.3–0.9)
Total Bilirubin: 1.4 mg/dL — ABNORMAL HIGH (ref 0.3–1.2)
Total Protein: 6.7 g/dL (ref 6.5–8.1)

## 2023-04-28 LAB — COMPREHENSIVE METABOLIC PANEL
ALT: 23 U/L (ref 0–44)
AST: 53 U/L — ABNORMAL HIGH (ref 15–41)
Albumin: 3 g/dL — ABNORMAL LOW (ref 3.5–5.0)
Alkaline Phosphatase: 138 U/L — ABNORMAL HIGH (ref 38–126)
Anion gap: 13 (ref 5–15)
BUN: <5 mg/dL — ABNORMAL LOW (ref 6–20)
CO2: 22 mmol/L (ref 22–32)
Calcium: 7.8 mg/dL — ABNORMAL LOW (ref 8.9–10.3)
Chloride: 99 mmol/L (ref 98–111)
Creatinine, Ser: 0.54 mg/dL (ref 0.44–1.00)
GFR, Estimated: >60 mL/min (ref >60)
Glucose, Bld: 139 mg/dL — ABNORMAL HIGH (ref 70–99)
Potassium: 3.4 mmol/L — ABNORMAL LOW (ref 3.5–5.1)
Sodium: 134 mmol/L — ABNORMAL LOW (ref 135–145)
Total Bilirubin: 1.1 mg/dL (ref 0.3–1.2)
Total Protein: 6.9 g/dL (ref 6.5–8.1)

## 2023-04-28 LAB — C DIFFICILE QUICK SCREEN W PCR REFLEX
C Diff antigen: NEGATIVE
C Diff interpretation: NOT DETECTED
C Diff toxin: NEGATIVE

## 2023-04-28 LAB — C-REACTIVE PROTEIN: CRP: 2.1 mg/dL — ABNORMAL HIGH (ref <1.0)

## 2023-04-28 LAB — IRON AND TIBC
Iron: 55 ug/dL (ref 28–170)
Saturation Ratios: 17 % (ref 10.4–31.8)
TIBC: 332 ug/dL (ref 250–450)
UIBC: 277 ug/dL

## 2023-04-28 LAB — GAMMA GT: GGT: 172 U/L — ABNORMAL HIGH (ref 7–50)

## 2023-04-28 LAB — FERRITIN: Ferritin: 355 ng/mL — ABNORMAL HIGH (ref 11–307)

## 2023-04-28 MED ORDER — GADOBUTROL 1 MMOL/ML IV SOLN
10.0000 mL | Freq: Once | INTRAVENOUS | Status: AC | PRN
  Administered 2023-04-28: 10 mL via INTRAVENOUS

## 2023-04-28 MED ORDER — IRBESARTAN 300 MG PO TABS
300.0000 mg | ORAL_TABLET | Freq: Every day | ORAL | Status: DC
  Administered 2023-04-28 – 2023-04-29 (×2): 300 mg via ORAL
  Filled 2023-04-28 (×2): qty 1

## 2023-04-28 MED ORDER — FOLIC ACID 1 MG PO TABS
1.0000 mg | ORAL_TABLET | Freq: Every day | ORAL | Status: DC
  Administered 2023-04-28 – 2023-04-29 (×2): 1 mg via ORAL
  Filled 2023-04-28 (×2): qty 1

## 2023-04-28 MED ORDER — MAGNESIUM SULFATE 4 GM/100ML IV SOLN: 4.0000 g | Freq: Once | INTRAVENOUS | Status: DC

## 2023-04-28 MED ORDER — POTASSIUM CHLORIDE CRYS ER 20 MEQ PO TBCR
60.0000 meq | EXTENDED_RELEASE_TABLET | Freq: Once | ORAL | Status: AC
  Administered 2023-04-28: 60 meq via ORAL
  Filled 2023-04-28: qty 3

## 2023-04-28 MED ORDER — MAGNESIUM SULFATE 2 GM/50ML IV SOLN
2.0000 g | Freq: Once | INTRAVENOUS | Status: AC
  Administered 2023-04-28: 2 g via INTRAVENOUS
  Filled 2023-04-28: qty 50

## 2023-04-28 MED ORDER — SPIRONOLACTONE 25 MG PO TABS
25.0000 mg | ORAL_TABLET | Freq: Every day | ORAL | Status: DC
  Administered 2023-04-28 – 2023-04-29 (×2): 25 mg via ORAL
  Filled 2023-04-28 (×2): qty 1

## 2023-04-28 MED ORDER — MAGNESIUM SULFATE 4 GM/100ML IV SOLN
4.0000 g | Freq: Once | INTRAVENOUS | Status: AC
  Administered 2023-04-28: 4 g via INTRAVENOUS
  Filled 2023-04-28: qty 100

## 2023-04-28 NOTE — Telephone Encounter (Signed)
Received call from Lab reporting a critical calcium value.   Reports calcium 6.5.  Advised patient was currently admitted at [facility].   Will forward to ordering provider.

## 2023-04-28 NOTE — Assessment & Plan Note (Addendum)
Significant hypocalcemia (CoCa 7.3), hypomagnesemia (1.1) and hypokalemia (2.7>2.4) on admission. Vague symptoms including muscle cramping, perioral numbness, fatigue, diarrhea and vomiting for the past month and a half. Unclear cause - GI loss/Poor oral intake vs. Malabsorption vs. IBD vs. Renal wasting Malignancy vs. Immunosuppression vs. Infection. - Started Spironolactone 25 mg - Fecal calprotectin pending - Celiac testing with Glia, tTg, IgA pending - Uca/Cr pending - Replete electrolytes PRN - Avoid medications that would drop electrolytes (diuretics, aminoglycosides) - Endo (Dr. Sharl Ma), agreed with our current plan elevated believes it might be a malabsorption issue as well - Consulted GI, appreciate recs - Consider nephrology consult pending labs

## 2023-04-28 NOTE — Assessment & Plan Note (Addendum)
Blood pressure is elevated to 140s-170s/100-120s. Did complain of a headache overnight, but no changes in vision.  - Continue Home amlodipine 10 mg daily - Increased Irbesartan to 300 mg daily in place of home olmesartan 20 mg daily - Added Spironolactone 25 mg to help with BP but also help with K levels

## 2023-04-28 NOTE — Assessment & Plan Note (Signed)
QTc 523> 504 was likely in the setting of hypokalemia. Improved to 466 likely due to regular repletion. - Caution with QT prolonging medications/antiemetics

## 2023-04-28 NOTE — Assessment & Plan Note (Addendum)
Hemoglobin 11.8 with MCV of 109.6. History of Vit B12 deficiency, last checked 3 months ago, was 245 and improved from 145 a year ago. Patient is not adherant to B12 supplementation outpatient. B12 level 203 and blood smear showed macrocytosis. Reticulocyte count, bilirubin, and LDH likely elevated due to Vitamin B12 deficiency as well. - 1000 mcg Vitamin B12 daily

## 2023-04-28 NOTE — Assessment & Plan Note (Addendum)
Urinalysis showed amber appearance, 30 protein, moderate hemoglobin, 11-20 RBCs, 11-20 WBC, negative nitrite. Did have squamous cells, likely not clean catch. No urinary symptoms, CVA tenderness or suprapubic pain. Likely related to dehydration. Denies lack of dysuria, increased frequency, or vaginal bleeding. Clean catch UA showed small amounts of hematuria.

## 2023-04-28 NOTE — Plan of Care (Signed)

## 2023-04-28 NOTE — Assessment & Plan Note (Signed)
Elevated alk phos, mild RUQ tenderness (mostly is midline upper abdominal), nausea/vomiting. Possibly related to cholestasis/cholecystitis. If cancer mets would expect high calcium. Hypoparathyroidism usually normal Alk phos. Considering osteomalacia but no pseudofractures/bone pain. RUQ [place] was negative. - Monitor on HFP

## 2023-04-28 NOTE — Assessment & Plan Note (Addendum)
Vitamin D level 6.54.  Could be causing hypocalcemia likely in the setting of GI losses/malabsorption with her numerous bowel movements and episodes of vomiting. - Vitamin D 50,000u weekly

## 2023-04-28 NOTE — Assessment & Plan Note (Signed)
Last A1c 5.8 three months ago and remained stable at 5.8.  Home medication of Trulicity 0.75 mg weekly-states she has been on this for 2 years. - Monitor glucose on labs, if elevated add on AC/HS checks

## 2023-04-28 NOTE — Assessment & Plan Note (Signed)
Dry intermittent cough for month and a half. Active smoker. No sputum production or hemoptysis. Possible related to reflux? CXR wnl. Consider post viral cough as well. Saturating well on room air. - Consider additional lung imaging given constellation of symptoms - Hold on PPI for now with electrolyte abnormalities

## 2023-04-28 NOTE — Assessment & Plan Note (Signed)
Ongoing for the past month.  Likely exacerbated by electrolyte abnormalities. Tachycardia has improved. - Replete electrolytes as needed

## 2023-04-28 NOTE — Consult Note (Signed)
Consultation  Referring Provider:  [facility]  Primary Care Physician:  Westley Chandler, MD Primary Gastroenterologist:  Gentry Fitz       Reason for Consultation:     Nausea, vomiting, diarrhea  LOS: 1 day          HPI:   Norma Ramsey is a 37 y.o. female with past medical history significant for diabetes, hypertension, presents for evaluation of nausea, vomiting, diarrhea.  Patient initially admitted with hypoglycemia, hypokalemia, and hypomagnesemia as well as low vitamin D thought to be secondary to GI losses.  Patient went to PCP and found to have calcium of 6.5, potassium was low was 2.7.  PTH 55.  Recommended to proceed to emergency department for further evaluation  Patient reports nausea and vomiting over the last month as well as diarrhea.  Prior to 1 month ago she was in her usual state of health with normal bowel movements.  States she typically will have multiple diarrheal bowel movements in the morning that become better throughout the day.  Anywhere between 4-8 watery bowel movements per day.  Denies previous antibiotic use.  Nonbloody bilious emesis not associated with eating and is also intermittent.  Denies melena/hematochezia.  Denies weight loss.  Abdominal ultrasound shows no gallstones or gallbladder wall thickening.  Normal liver.  Patient smokes 3 cigarettes/day.  Reports 2-3 shots of liquor per day.  Denies NSAIDs.  Denies family history of colon cancer or other GI issues.  Denies previous EGD/colonoscopies.      Past Medical History:  Diagnosis Date   Acute pain of left knee 06/03/2020   Hypertension    Obesity    Prediabetes    Tobacco abuse     Surgical History:  She  has a past surgical history that includes Wisdom tooth extraction (09/2016); Removal of implanon rod (05/09/2022); and Insertion of implanon rod (05/09/2022). Family History:  Her family history includes AAA (abdominal aortic aneurysm) in her paternal grandfather; Diabetes in her paternal  grandfather; Hypertension in her father and mother. Social History:   reports that she has been smoking cigarettes. She started smoking about 11 years ago. She has a 3.4 pack-year smoking history. She has never used smokeless tobacco. She reports current alcohol use of about 1.0 standard drink of alcohol per week. She reports current drug use. Drug: Marijuana.  Prior to Admission medications   Medication Sig Start Date End Date Taking? Authorizing Provider  amLODipine (NORVASC) 10 MG tablet Take 1 tablet (10 mg total) by mouth every evening. 01/20/23  Yes Westley Chandler, MD  Dulaglutide (TRULICITY) 0.75 MG/0.5ML SOPN Inject 0.75 mg weekly 01/20/23  Yes Westley Chandler, MD  olmesartan (BENICAR) 20 MG tablet Take 1 tablet (20 mg total) by mouth daily. 01/20/23  Yes Westley Chandler, MD    Current Facility-Administered Medications  Medication Dose Route Frequency Provider Last Rate Last Admin   acetaminophen (TYLENOL) tablet 650 mg  650 mg Oral Q6H PRN Levin Erp, MD   650 mg at 04/28/23 4010   Or   acetaminophen (TYLENOL) suppository 650 mg  650 mg Rectal Q6H PRN Levin Erp, MD       amLODipine (NORVASC) tablet 10 mg  10 mg Oral QPM Levin Erp, MD   10 mg at 04/27/23 1744   capsaicin (ZOSTRIX) 0.025 % cream   Topical BID Levin Erp, MD   1 Application at 04/27/23 0849   cyanocobalamin (VITAMIN B12) tablet 1,000 mcg  1,000 mcg Oral Daily  Levin Erp, MD   1,000 mcg at 04/28/23 1001   enoxaparin (LOVENOX) injection 60 mg  60 mg Subcutaneous Q24H Hammons, Kimberly B, RPH   60 mg at 04/28/23 1001   folic acid (FOLVITE) tablet 1 mg  1 mg Oral Daily Glendale Chard, DO   1 mg at 04/28/23 1225   irbesartan (AVAPRO) tablet 300 mg  300 mg Oral Daily Fatima Blank, Ricki Rodriguez, DO   300 mg at 04/28/23 1003   melatonin tablet 5 mg  5 mg Oral QHS Levin Erp, MD   5 mg at 04/27/23 2145   scopolamine (TRANSDERM-SCOP) 1 MG/3DAYS 1.5 mg  1 patch Transdermal Q72H Levin Erp, MD        spironolactone (ALDACTONE) tablet 25 mg  25 mg Oral Daily Glendale Chard, DO   25 mg at 04/28/23 1225   Vitamin D (Ergocalciferol) (DRISDOL) 1.25 MG (50000 UNIT) capsule 50,000 Units  50,000 Units Oral Q7 days Alicia Amel, MD   50,000 Units at 04/27/23 1144    Allergies as of 04/26/2023   (No Known Allergies)    Review of Systems  Constitutional:  Positive for malaise/fatigue. Negative for chills, fever and weight loss.  HENT:  Negative for hearing loss and tinnitus.   Eyes:  Negative for blurred vision and double vision.  Respiratory:  Negative for cough and hemoptysis.   Cardiovascular:  Negative for chest pain and palpitations.  Gastrointestinal:  Positive for diarrhea, nausea and vomiting. Negative for abdominal pain, blood in stool, constipation, heartburn and melena.  Genitourinary:  Negative for dysuria and urgency.  Musculoskeletal:  Negative for myalgias and neck pain.  Skin:  Negative for itching and rash.  Neurological:  Positive for weakness. Negative for seizures and loss of consciousness.  Psychiatric/Behavioral:  Negative for depression and suicidal ideas.        Physical Exam:  Vital signs in last 24 hours: Temp:  [97.9 F (36.6 C)-98.7 F (37.1 C)] 97.9 F (36.6 C) (07/26 1205) Pulse Rate:  [87-96] 87 (07/26 1205) Resp:  [18] 18 (07/26 0315) BP: (142-179)/(100-118) 142/100 (07/26 1205) SpO2:  [98 %-100 %] 98 % (07/26 1205) Weight:  [128.4 kg] 128.4 kg (07/26 0315) Last BM Date : 04/27/23 Last BM recorded by nurses in past 5 days No data recorded  Physical Exam Constitutional:      Appearance: She is obese. She is not ill-appearing.  HENT:     Head: Normocephalic and atraumatic.     Nose: Nose normal. No congestion.  Eyes:     Extraocular Movements: Extraocular movements intact.     Conjunctiva/sclera: Conjunctivae normal.  Cardiovascular:     Rate and Rhythm: Normal rate and regular rhythm.  Pulmonary:     Effort: Pulmonary effort is normal.      Breath sounds: Normal breath sounds.  Abdominal:     General: Abdomen is flat. Bowel sounds are normal. There is no distension.     Palpations: Abdomen is soft. There is no mass.     Tenderness: There is no abdominal tenderness. There is no guarding or rebound.     Hernia: No hernia is present.  Musculoskeletal:        General: No swelling. Normal range of motion.     Cervical back: Normal range of motion and neck supple.  Skin:    General: Skin is warm and dry.     Coloration: Skin is not jaundiced.  Neurological:     General: No focal deficit present.     Mental  Status: She is oriented to person, place, and time.  Psychiatric:        Mood and Affect: Mood normal.        Behavior: Behavior normal.        Thought Content: Thought content normal.        Judgment: Judgment normal.      LAB RESULTS: Recent Labs    04/26/23 1951 04/27/23 0301 04/28/23 0431  WBC 14.3* 11.1* 10.8*  HGB 11.8* 10.1* 10.9*  HCT 33.0* 28.2* 29.4*  PLT 481* 391 391   BMET Recent Labs    04/27/23 0301 04/27/23 1520 04/28/23 0431  NA 138 134* 136  K 2.6* 3.1* 3.1*  CL 100 99 97*  CO2 23 25 25   GLUCOSE 131* 150* 139*  BUN 5* 5* <5*  CREATININE 0.59 0.57 0.41*  CALCIUM 6.1* 6.8* 8.1*   LFT Recent Labs    04/28/23 0431  PROT 6.7  ALBUMIN 2.9*  AST 32  ALT 17  ALKPHOS 133*  BILITOT 1.4*  BILIDIR 0.3*  IBILI 1.1*   PT/INR No results for input(s): "LABPROT", "INR" in the last 72 hours.  STUDIES: CT SOFT TISSUE NECK W CONTRAST  Result Date: 04/27/2023 CLINICAL DATA:  Provided history: Thyroid nodule. Hypocalcemia. Anterior neck fullness. EXAM: CT NECK WITH CONTRAST TECHNIQUE: Multidetector CT imaging of the neck was performed using the standard protocol following the bolus administration of intravenous contrast. RADIATION DOSE REDUCTION: This exam was performed according to the departmental dose-optimization program which includes automated exposure control, adjustment of the mA and/or  kV according to patient size and/or use of iterative reconstruction technique. CONTRAST:  75mL OMNIPAQUE IOHEXOL 350 MG/ML SOLN COMPARISON:  CT angiogram head/neck 07/19/2021. FINDINGS: Pharynx and larynx: No appreciable swelling or mass within the oral cavity, pharynx or larynx. Salivary glands: No inflammation, mass, or stone. Thyroid: No CT evidence of a thyroid nodule. Lymph nodes: Nonspecific mildly enlarged left level II lymph node (measuring 13 mm in short axis)(series 6, image 63). Vascular: The major vascular structures of the neck are patent. Atherosclerotic plaque about the carotid bifurcations bilaterally, and within the intracranial internal carotid arteries. Partially retropharyngeal course of the cervical right ICA. Limited intracranial: No evidence of an acute intracranial abnormality within the field of view. Visualized orbits: Bilateral proptosis. Mastoids and visualized paranasal sinuses: No significant paranasal sinus disease or mastoid effusion. Skeleton: No acute fracture or aggressive osseous lesion. Upper chest: No consolidation within the imaged lung apices. IMPRESSION: 1. No neck mass is identified. Specifically, there is no CT evidence of a thyroid nodule. 2. Nonspecific mildly enlarged left level II lymph node (measuring 13 mm in short axis). 3. Atherosclerotic plaque about the carotid bifurcations and within the intracranial internal carotid arteries. 4. Bilateral proptosis. Electronically Signed   By: Jackey Loge D.O.   On: 04/27/2023 14:09   US Abdomen Limited RUQ (LIVER/GB)  Result Date: 04/27/2023 CLINICAL DATA:  Abdominal pain EXAM: ULTRASOUND ABDOMEN LIMITED RIGHT UPPER QUADRANT COMPARISON:  Abdominal ultrasound July 19, 2021 FINDINGS: Gallbladder: No gallstones or wall thickening visualized. No sonographic Murphy sign noted by sonographer. Common bile duct: Diameter: 3 mm Liver: No focal lesion identified. Within normal limits in parenchymal echogenicity. Portal vein is  patent on color Doppler imaging with normal direction of blood flow towards the liver. Other: None. IMPRESSION: No etiology for abdominal pain is identified. Electronically Signed   By: Jacob Moores M.D.   On: 04/27/2023 06:15   DG Chest 2 View  Result Date: 04/26/2023 CLINICAL  DATA:  Weakness EXAM: CHEST - 2 VIEW COMPARISON:  Chest x-ray 07/19/2021 FINDINGS: The heart size and mediastinal contours are within normal limits. Both lungs are clear. The visualized skeletal structures are unremarkable. IMPRESSION: No active cardiopulmonary disease. Electronically Signed   By: Darliss Cheney M.D.   On: 04/26/2023 21:47      Impression    37 year old female presented with electrolyte abnormalities and 1 month of nausea/vomiting and diarrhea  Nausea/vomiting/diarrhea Celiac serologies pending Fecal calprotectin pending WBC 10.8, improving Suspect nausea/vomiting and diarrhea could be secondary to her electrolyte abnormalities or could be resulting in her electrolyte abnormalities. However, with increased white count and acute onset of symptoms from being in usual state of health, infection cannot be ruled out. -- GI pathogen panel and C. difficile to rule out infection -- Pancreatic fecal elastase -- Await pending lab results --Continue supportive care and anti-emetics  Elevated LFTs AST 32/ALT 17/alk phos 133, improving T. bili 1.4 (indirect bilirubin 1.1, direct bilirubin 0.3) -- continue to trend -- abdominal US unrevealing -- previous history of AST > ALT. Recommend cessation of alcohol use.  Electrolyte abnormality, improving Magnesium 1.5 Potassium 3.1 Calcium 8.1  Macrocytic anemia with vitamin B12 deficiency Hgb 10.9, MCV 106.9  Diabetes A1C 5.8  Thank you for your kind consultation, we will continue to follow.   Zayah Keilman Leanna Sato  04/28/2023, 2:01 PM

## 2023-04-28 NOTE — Progress Notes (Signed)
Daily Progress Note Intern Pager: [phone]  Patient name: Norma Ramsey Medical record number: [id number] Date of birth: [birth year] Age: 37 y.o. Gender: female  Primary Care Provider: Westley Chandler, MD Consultants: Endocrine Code Status: Full Code  Pt Overview and Major Events to Date:  7/25: Admitted   Assessment and Plan: Norma Ramsey is a 37 y.o. female PMH of HTN and diabetes presenting with significant electrolyte abnormalities including hypocalcemia, hypokalemia and hypomagnesemia.   Hypocalcemia, hypokalemia, and hypomagnesemia, hypovitaminosis D could be secondary to GI losses/malabsorption with her numerous bowel movements and episodes of vomiting.    Hypomagnesemia and hypokalemic metabolic alkalosis could also be caused by Gitelman syndrome. Still awaiting urine calcium results.    IBS vs IBD due to malabsorption could also be considered due to the severity of her bowel movements and episodes of vomiting over the past month. Patient denies family history at this time, except for mother having hypokalemia.  -      Hospital     * (Principal) Electrolyte abnormality     Significant hypocalcemia (CoCa 7.3), hypomagnesemia (1.1) and  hypokalemia (2.7>2.4) on admission. Vague symptoms including muscle  cramping, perioral numbness, fatigue, diarrhea and vomiting for the past  month and a half. Unclear cause - GI loss/Poor oral intake vs.  Malabsorption vs. IBD vs. Renal wasting Malignancy vs. Immunosuppression  vs. Infection. - Started Spironolactone 25 mg - Fecal calprotectin pending - Celiac testing with Glia, tTg, IgA pending - Uca/Cr pending - Replete electrolytes PRN - Avoid medications that would drop electrolytes (diuretics,  aminoglycosides) - Endo (Dr. Sharl Ma), agreed with our current plan elevated believes it might  be a malabsorption issue as well - Consulted GI, appreciate recs - Consider nephrology consult pending labs        Essential  hypertension     Blood pressure is elevated to 140s-170s/100-120s. Did complain of a  headache overnight, but no changes in vision.  - Continue Home amlodipine 10 mg daily - Increased Irbesartan to 300 mg daily in place of home olmesartan 20 mg  daily - Added Spironolactone 25 mg to help with BP but also help with K levels         Intermittent palpitations     Ongoing for the past month.  Likely exacerbated by electrolyte  abnormalities. Tachycardia has improved. - Replete electrolytes as needed        Diabetes mellitus without complication (HCC)     Last A1c 5.8 three months ago and remained stable at 5.8.  Home  medication of Trulicity 0.75 mg weekly-states she has been on this for 2  years. - Monitor glucose on labs, if elevated add on AC/HS checks        Vomiting and diarrhea     Intermittent for the past month. No diarrhea today but does have  vomiting daily. No headaches-less likely to be elevated intracranial  pressures but keep on differential. Overall benign abdominal exam.  Frequent marijauna use, could consider cannabinoid hyperemesis syndrome.  Consider pseudotumor cerebri on ddx due to headaches, some vision changes,  nausea, vomiting and some back pain. - Continue scopolamine patch - Capsaicin cream to apply to upper abdomen to help with nausea - Avoid QT prolonging medications  - MRI brain due to suspicion of pseudotumor cerebri         Macrocytic anemia with vitamin B12 deficiency     Hemoglobin 11.8 with MCV of 109.6. History of Vit B12 deficiency, last  checked  3 months ago, was 245 and improved from 145 a year ago. Patient is  not adherant to B12 supplementation outpatient. B12 level 203 and blood  smear showed macrocytosis. Reticulocyte count, bilirubin, and LDH likely  elevated due to Vitamin B12 deficiency as well. - 1000 mcg Vitamin B12 daily        Microscopic hematuria     Urinalysis showed amber appearance, 30 protein, moderate hemoglobin,   11-20 RBCs, 11-20 WBC, negative nitrite. Did have squamous cells, likely  not clean catch. No urinary symptoms, CVA tenderness or suprapubic pain.  Likely related to dehydration. Denies lack of dysuria, increased  frequency, or vaginal bleeding. Clean catch UA showed small amounts of  hematuria.        Vitamin D deficiency     Vitamin D level 6.54.  Could be causing hypocalcemia likely in the  setting of GI losses/malabsorption with her numerous bowel movements and  episodes of vomiting. - Vitamin D 50,000u weekly       FEN/GI: Regular diet PPx: Lovenox Dispo: pending clinical improvement  Subjective:  Patient sitting in bed this morning eating breakfast.  She reports that overnight she had to get up numerous times to urinate but has not had any more bowel movements or vomiting episodes.  Doing well overall except for headache that she had this morning which resolved with Tylenol.  Her only other complaint is some back pain today.  Denies headache, blurry vision, chest pain, shortness of breath.  Objective: Temp:  [97.9 F (36.6 C)-98.7 F (37.1 C)] 97.9 F (36.6 C) (07/26 1205) Pulse Rate:  [87-96] 87 (07/26 1205) Resp:  [18] 18 (07/26 0315) BP: (142-179)/(100-118) 142/100 (07/26 1205) SpO2:  [98 %-100 %] 98 % (07/26 1205) Weight:  [128.4 kg] 128.4 kg (07/26 0315) Physical Exam: General: Pleasant, NAD, awake and alert, no papilledema Cardiovascular: RRR. No M/R/G Respiratory: CTAB. Normal WOB on RA. No crackles, wheezing, or rhonchi. Abdomen: Soft, minimal to no tenderness, non-distended. Bowel sounds normoactive. Extremities: no BLE edema  Laboratory: Most recent CBC Lab Results  Component Value Date   WBC 10.8 (H) 04/28/2023   HGB 10.9 (L) 04/28/2023   HCT 29.4 (L) 04/28/2023   MCV 106.9 (H) 04/28/2023   PLT 391 04/28/2023   Most recent BMP    Latest Ref Rng & Units 04/28/2023    4:31 AM  BMP  Glucose 70 - 99 mg/dL 696   BUN 6 - 20 mg/dL <5   Creatinine  2.95 - 1.00 mg/dL 2.84   Sodium 132 - 440 mmol/L 136   Potassium 3.5 - 5.1 mmol/L 3.1   Chloride 98 - 111 mmol/L 97   CO2 22 - 32 mmol/L 25   Calcium 8.9 - 10.3 mg/dL 8.1    UA: small hematuria Mag: 1.5 Vit B12: 203 Blood smear: Macrocytosis Lactate dehydrogenase: 266 Reticulocytes: 89.7/3.2% Alk phos: 133 Bili: 1.4 Albumin: 2.9   Imaging/Diagnostic Tests: No new imaging.  Fortunato Curling, DO 04/28/2023, 1:12 PM PGY-1, [facility]  FPTS Intern pager: [phone], text pages welcome Secure chat group [facility] Teaching Service

## 2023-04-28 NOTE — Progress Notes (Signed)
Patient reported new headache pain this morning 8/10 that radiates to her back. Patient also reported waking up soaking wet and having more urine output. Tylenol was given to patient and was effective. Levin Erp, MD was notified.   Norma Ramsey

## 2023-04-28 NOTE — Hospital Course (Addendum)
Norma Ramsey is a 37 y.o.female with a history of hypertension and diabetes who was admitted to the [facility] Medicine Teaching Service at [facility] for significant electrolyte abnormalities including hypocalcemia, hypokalemia, and hypomagnesemia. Her hospital course is detailed below:  Electrolyte Abnormalities - HypoCa, HypoK, HypoMg Patient presented to the ED with a variety of different symptoms including fatigue, muscle cramping, perioral numbness, vomiting, diarrhea, and abdominal pain.  She was found to have multiple electrolyte abnormalities on her labs with her lowest values being calcium 6.1, magnesium 1.1, potassium 2.4.  Her electrolytes were regularly repleted and still seem to deplete without reason. Malabsorption was considered as a cause and endocrine and GI were consulted who recommended perneicous anemia work up and endoscopic evaluation outpatient after blood/stool testing. Numerous tests were performed: fecal calprotectin ***, celiac testing ***, and Uca/Ucr ratio ***.  Spironolactone 25 mg was also added to her regimen to help with her BP as well as K absorption.  HTN Blood pressure seem to flucuate for the patient regularly, but were elevated upon admission and her hospitalization. She was started on her home Amlodipine 10 mg and Irbesartan was increased to 300 mg daily due to her rising pressures in place of 150 mg which is the equivalent of her home Olmesartan 20 mg daily. Spironolactone 25 mg was also added to her regimen for better BP control.  Macrocytic Anemia with Vitamin B12 Deficiency Hemoglobin 11.8 with MCV of 109.6.  He has a history of vitamin D deficiency last checked 3 months ago was 245 and improved from 145 a year ago. Patient has not been adherent to B12 supplementation outpatient.  Started on 1000 mcg vitamin B12 daily ***. B12 level inpatient was 203 and blood smear showed macrocytosis. Reticulocyte count, bilirubin, and LDH likely elevated due to Vitamin B12 deficiency  as well.  Vitamin D Deficiency Vitamin D level 6.54. Could be causing hypocalcemia likely in the setting of GI losses/malabsorption with her numerous bowel movements and episodes of vomiting. Started Vitamin D 50,000u weekly ***.   Microscopic Hematuria Clean catch UA showed small amounts of hematuria. No urinary symptoms, CVA tenderness or suprapubic pain.  Likely related to dehydration. Denies lack of dysuria, increased frequency, or vaginal bleeding. Clean catch UA showed small amounts of hematuria.  Vomiting and Diarrhea Patient reporting intermittent vomiting and diarrhea for the past month.  Patient admits to frequent marijuana use and has history of GERD. The absence of postprandial nausea or abdominal pain makes PUD, DO or gastroparesis less suspicious. MRI brain conducted for concern of pseudotumor cerebri which was negative. GI was consulted who recommended endoscopy outpatient.   Other chronic conditions were medically managed with home medications and formulary alternatives as necessary:  T2DM: Last A1c 5.83 months ago and remained stable at 5.8 during her hospitalization.  Home medication of Trulicity 0.75 mg weekly was held during her hospitalization.  Glucose was checked on labs and remained stable.  PCP Follow-up Recommendations:

## 2023-04-29 ENCOUNTER — Other Ambulatory Visit (HOSPITAL_COMMUNITY): Payer: Self-pay

## 2023-04-29 MED ORDER — VITAMIN B-12 1000 MCG PO TABS
1000.0000 ug | ORAL_TABLET | Freq: Every day | ORAL | 4 refills | Status: DC
Start: 2023-04-29 — End: 2023-09-07
  Filled 2023-04-29: qty 30, 30d supply, fill #0

## 2023-04-29 MED ORDER — VITAMIN D (ERGOCALCIFEROL) 1.25 MG (50000 UNIT) PO CAPS
50000.0000 [IU] | ORAL_CAPSULE | ORAL | 0 refills | Status: AC
  Filled 2023-04-29: qty 5, 35d supply, fill #0

## 2023-04-29 MED ORDER — CALCIUM CARBONATE 1500 (600 CA) MG PO TABS
1.0000 | ORAL_TABLET | Freq: Two times a day (BID) | ORAL | 0 refills | Status: DC
  Filled 2023-04-29: qty 30, 15d supply, fill #0

## 2023-04-29 MED ORDER — POTASSIUM CHLORIDE CRYS ER 20 MEQ PO TBCR
40.0000 meq | EXTENDED_RELEASE_TABLET | Freq: Every day | ORAL | 0 refills | Status: DC
  Filled 2023-04-29: qty 60, 30d supply, fill #0

## 2023-04-29 MED ORDER — MAGNESIUM OXIDE -MG SUPPLEMENT 400 (240 MG) MG PO TABS
1.0000 | ORAL_TABLET | Freq: Every day | ORAL | 0 refills | Status: AC
  Filled 2023-04-29: qty 30, 30d supply, fill #0

## 2023-04-29 MED ORDER — MAGNESIUM SULFATE 4 GM/100ML IV SOLN
4.0000 g | Freq: Once | INTRAVENOUS | Status: AC
  Administered 2023-04-29: 4 g via INTRAVENOUS
  Filled 2023-04-29: qty 100

## 2023-04-29 MED ORDER — CALCIUM CARBONATE 1250 (500 CA) MG PO TABS
1000.0000 mg | ORAL_TABLET | Freq: Once | ORAL | Status: AC
  Administered 2023-04-29: 2500 mg via ORAL
  Filled 2023-04-29: qty 2

## 2023-04-29 MED ORDER — SCOPOLAMINE 1 MG/3DAYS TD PT72
1.0000 | MEDICATED_PATCH | TRANSDERMAL | 12 refills | Status: DC
  Filled 2023-04-29: qty 10, 30d supply, fill #0

## 2023-04-29 MED ORDER — POTASSIUM CHLORIDE CRYS ER 20 MEQ PO TBCR
40.0000 meq | EXTENDED_RELEASE_TABLET | Freq: Once | ORAL | Status: AC
  Administered 2023-04-29: 40 meq via ORAL
  Filled 2023-04-29: qty 2

## 2023-04-29 MED ORDER — CAPSAICIN 0.025 % EX CREA
TOPICAL_CREAM | Freq: Two times a day (BID) | CUTANEOUS | 0 refills | Status: DC
  Filled 2023-04-29: qty 60, 15d supply, fill #0

## 2023-04-29 MED ORDER — OLMESARTAN MEDOXOMIL 40 MG PO TABS
40.0000 mg | ORAL_TABLET | Freq: Every day | ORAL | 0 refills | Status: DC
  Filled 2023-04-29: qty 30, 30d supply, fill #0

## 2023-04-29 MED ORDER — LOPERAMIDE HCL 2 MG PO CAPS
2.0000 mg | ORAL_CAPSULE | Freq: Four times a day (QID) | ORAL | 0 refills | Status: DC | PRN
  Filled 2023-04-29: qty 30, 8d supply, fill #0

## 2023-04-29 MED ORDER — FOLIC ACID 1 MG PO TABS
1.0000 mg | ORAL_TABLET | Freq: Every day | ORAL | 0 refills | Status: AC
  Filled 2023-04-29: qty 30, 30d supply, fill #0

## 2023-04-29 MED ORDER — SPIRONOLACTONE 25 MG PO TABS
25.0000 mg | ORAL_TABLET | Freq: Every day | ORAL | 0 refills | Status: DC
  Filled 2023-04-29: qty 30, 30d supply, fill #0

## 2023-04-29 NOTE — Discharge Summary (Addendum)
Family Medicine Teaching [facility] Discharge Summary  Patient name: Norma Ramsey Medical record number: [id number] Date of birth: 1986/07/29 Age: 37 y.o. Gender: female Date of Admission: 04/26/2023  Date of Discharge: 04/29/2023 Admitting Physician: Levin Erp, MD  Primary Care Provider: Westley Chandler, MD Consultants: GI, also discussed case with endocrinology over the phone  Indication for Hospitalization: Nausea/Vomiting  Brief Hospital Course:  Norma Ramsey is a 37 y.o.female with a history of hypertension and diabetes who was admitted to the [facility] Medicine Teaching Service at [facility] for significant electrolyte abnormalities including hypocalcemia, hypokalemia, and hypomagnesemia in the setting of nausea, vomiting, and diarrhea. Her hospital course is detailed below:  Electrolyte Abnormalities - HypoCa, HypoK, HypoMg Hypovitaminosis D  Patient presented to the ED with a variety of different symptoms including fatigue, muscle cramping, perioral numbness, vomiting, diarrhea, and abdominal pain.  She was found to have multiple electrolyte abnormalities on her labs with her lowest values being calcium 6.1, magnesium 1.1, potassium 2.4.  Her electrolytes were regularly repleted over the course of her hospitalization. Of note her Vitamin D level was also very low at 6.54 which may have been contributing to her significant hypocalcemia. This was repleted.  Endocrine workup including thyroid and parathyroid testing was WNL. A MRI of the brain was obtained with concern that she could have idiopathic intracranial hypertension leading to N/V and electrolyte loss, but imaging was normal. Renin/Aldosterone ratio is pending at the time of discharge.  Malabsorption was considered and GI was consulted who recommended perneicous anemia work up and endoscopic evaluation outpatient after blood/stool testing. Fecal calprotectin, celiac testing, and pernicious anemia testing are all pending at  the time of discharge. She will need outpatient GI follow-up and a likely endoscopy.  Renal tubular disorders remain on the differential if her GI workup is negative. With the consideration of hyperaldosteronism, she was started on spironolactone 25mg  daily.  Ultimately the cause of her lab abnormalities remains uncertain at the time of discharge, but she is symptom free and stable for further workup in the outpatient setting. It is possible that this is all related to [facility] use, though she only endorses 2-3 drinks daily which would not be expected to cause such profound derangements.   HTN Blood pressure elevated to the 160s/110s. Continued her home CCB amlodipine 10mg  daily. Her ARB was increased to maximum dose (olmesartan 40mg  daily) and we added spironolactone 25mg  daily as above. .  Macrocytic Anemia with Vitamin B12 Deficiency Hemoglobin 11.8 with MCV of 109.6.  He has a history of vitamin D deficiency last checked 3 months ago was 245 and improved from 145 a year ago. Patient has not been adherent to B12 supplementation outpatient.  Started on 1000 mcg vitamin B12 daily . B12 level inpatient was 203 and blood smear showed macrocytosis. Reticulocyte count, bilirubin, and LDH likely elevated due to Vitamin B12 deficiency as well.  Microscopic Hematuria Clean catch UA showed small amounts of hematuria. No urinary symptoms, CVA tenderness or suprapubic pain. Clean catch UA showed small amounts of hematuria.   PCP Follow-up Recommendations:   Please follow-up her microscopic hematuria. Recommend confirming on repeat UA.  Titrate BP regimen, remained elevated at time of discharge Please ensure she has follow-up with GI for potential EGD and for follow-up of transaminitis (likely 2/2 etOH, but some autoimmune hepatitis labs are pending). Follow-up renin-aldosterone level, she has already been started on spironolactone.  Follow-up celiac testing, fecal calprotectin, and pernicious anemia labs  (Gastrin, intrinsic factor  Ab, and anti-parietal cell Ab)  Alcohol cessation would be beneficial. Please counsel.  Discharge Diagnoses/Problem List:  Principal Problem:   Electrolyte abnormality Active Problems:   Vomiting and diarrhea   Essential hypertension   Hypokalemia   Intermittent palpitations   Diabetes mellitus without complication (HCC)   Macrocytic anemia with vitamin B12 deficiency   Microscopic hematuria   Vitamin D deficiency   Disposition: Home  Discharge Condition: Stable   Discharge Exam:  Gen: Sleeping on arrival, awakens easily, in good spirits and NAD Cardio: RRR, no m/r/g Respiratory: Clear throughout, normal WOB on RA Abd: obese, non-tender, non-distended MSK: Without edema or deformity    Significant Procedures: None   Significant Labs and Imaging:  Recent Labs  Lab 04/28/23 0431  WBC 10.8*  HGB 10.9*  HCT 29.4*  PLT 391   Recent Labs  Lab 04/27/23 1520 04/28/23 0431 04/28/23 1526 04/29/23 0436  NA 134* 136 134* 133*  K 3.1* 3.1* 3.4* 3.6  CL 99 97* 99 102  CO2 25 25 22 23   GLUCOSE 150* 139* 139* 133*  BUN 5* <5* <5* <5*  CREATININE 0.57 0.41* 0.54 0.48  CALCIUM 6.8* 8.1* 7.8* 7.6*  MG 1.5* 1.5*  --  1.6*  ALKPHOS  --  133* 138* 109  AST  --  32 53* 47*  ALT  --  17 23 25   ALBUMIN  --  2.9* 3.0* 2.7*   TSH 1.170 PTH 55 LDH 266 Haptoglobin 189  MRI Brain IMPRESSION: Normal MRI appearance the brain. No acute or focal lesion to explain the patient's symptoms.  RUQ US FINDINGS: Gallbladder:   No gallstones or wall thickening visualized. No sonographic Murphy sign noted by sonographer.   Common bile duct:   Diameter: 3 mm   Liver:   No focal lesion identified. Within normal limits in parenchymal echogenicity. Portal vein is patent on color Doppler imaging with normal direction of blood flow towards the liver.   Other: None.   IMPRESSION: No etiology for abdominal pain is identified.   CT Soft Tissue  Neck IMPRESSION: 1. No neck mass is identified. Specifically, there is no CT evidence of a thyroid nodule. 2. Nonspecific mildly enlarged left level II lymph node (measuring 13 mm in short axis). 3. Atherosclerotic plaque about the carotid bifurcations and within the intracranial internal carotid arteries. 4. Bilateral proptosis.  Results/Tests Pending at Time of Discharge: Glia +tTG IgA, Total IgA, Fecal Calprotectin, Aldosterone + Renin activity ratio, GI pathogen panel, pancreatic elastase, intrinsic factor antibodies, gastrin, anti-parietal cell antibody, mitochondrial antibody, anti-smooth muscle antibody   Discharge Medications:  Allergies as of 04/29/2023   No Known Allergies      Medication List     TAKE these medications    amLODipine 10 MG tablet Commonly known as: NORVASC Take 1 tablet (10 mg total) by mouth every evening.   calcium carbonate 1500 (600 Ca) MG Tabs tablet Commonly known as: OSCAL Take 1 tablet (1,500 mg total) by mouth 2 (two) times daily with a meal.   capsaicin 0.025 % cream Commonly known as: ZOSTRIX Apply topically 2 (two) times daily.   cyanocobalamin 1000 MCG tablet Commonly known as: VITAMIN B12 Take 1 tablet (1,000 mcg total) by mouth daily.   folic acid 1 MG tablet Commonly known as: FOLVITE Take 1 tablet (1 mg total) by mouth daily. Start taking on: April 30, 2023   loperamide 2 MG capsule Commonly known as: IMODIUM Take 1 capsule (2 mg total) by mouth 4 (four)  times daily as needed for diarrhea or loose stools.   magnesium oxide 400 (240 Mg) MG tablet Commonly known as: MAG-OX Take 1 tablet (400 mg total) by mouth daily.   olmesartan 40 MG tablet Commonly known as: BENICAR Take 1 tablet (40 mg total) by mouth daily. What changed:  medication strength how much to take   potassium chloride SA 20 MEQ tablet Commonly known as: KLOR-CON M Take 2 tablets (40 mEq total) by mouth daily.   spironolactone 25 MG tablet Commonly  known as: ALDACTONE Take 1 tablet (25 mg total) by mouth daily. Start taking on: April 30, 2023   Transderm-Scop 1 MG/3DAYS Generic drug: scopolamine Place 1 patch (1.5 mg total) onto the skin behind ear every 3 (three) days.   Trulicity 0.75 MG/0.5ML Sopn Generic drug: Dulaglutide Inject 0.75 mg weekly   Vitamin D (Ergocalciferol) 1.25 MG (50000 UNIT) Caps capsule Commonly known as: DRISDOL Take 1 capsule (50,000 Units total) by mouth every 7 (seven) days. Start taking on: May 04, 2023        Discharge Instructions: Please refer to Patient Instructions section of EMR for full details.  Patient was counseled important signs and symptoms that should prompt return to medical care, changes in medications, dietary instructions, activity restrictions, and follow up appointments.   Follow-Up Appointments:  Follow-up Information     Westley Chandler, MD Follow up on 05/01/2023.   Specialty: Family Medicine Why: You have an appointment with one of Dr. Theora Gianotti colleagues on Monday 7/29 at 2:30pm. Please arrive 15 minutes early. Contact information: [address] [place] [address] [phone]         Jenel Lucks, MD. Schedule an appointment as soon as possible for a visit.   Specialty: Gastroenterology Why: Please make an appointment with Dr. Milas Hock office for followup in the next two weeks Contact information: [address] [place] [address] [phone]                 Alicia Amel, MD 04/29/2023, 1:18 PM PGY-3, [facility]

## 2023-04-29 NOTE — Plan of Care (Signed)

## 2023-04-29 NOTE — Plan of Care (Signed)

## 2023-04-29 NOTE — Progress Notes (Signed)
Notified MD of Pt BP 160/100s. Per DO. Spence, pt is stable to continue with discharge. All Ivs  and monitors have been removed. Discharge instructions have been given. Pt has called mother to pick her up.

## 2023-04-29 NOTE — Discharge Instructions (Addendum)
Ms. Kastle,  You admitted to the hospital due to low levels of calcium, magnesium, and potassium.  It is not altogether clear why these levels were so low for you.  We think it may have something to do with your ability to absorb things to your GI tract.  Therefore it is very important that you follow-up with a gastroenterologist as an outpatient.  Dr. Manson Passey can help you make these connections.  In the meantime, I want you to be sure that you take the supplements that we are sending you home with.  It is also possible that these abnormalities are related to the alcohol that you are drinking every day.  Cutting out drinking altogether is the best thing you can do while we sort this out.  We are also making some adjustments to your blood pressure medications: - We are increasing you olmesartan to 40mg  daily and adding a new BP med called spironolactone - You are going home on the following supplements: calcium, magnesium, potassium, vitamin D, vitamin B12, and folate  - We will see you for follow-up in our clinic on Monday, 7/29 at 2:30pm, please arrive 15 minutes early. This visit is very important so that we can check your labs  If you have any questions or concerns you can always call the clinic. In the case of emergency, please do not hesitate to come back to the hospital.  Eliezer Mccoy, MD

## 2023-04-29 NOTE — Progress Notes (Signed)
FMTS Brief Note Seen early this AM. Feels very well. Symptoms improving. MRI negative, discussed Electrolytes improved, will still need significant outpatient supplementation Malabsorption labs pending Will arrange Monday follow up. Full discharge note to follow Terisa Starr, MD  [facility] Medicine Teaching Service

## 2023-05-01 ENCOUNTER — Ambulatory Visit (INDEPENDENT_AMBULATORY_CARE_PROVIDER_SITE_OTHER): Payer: 59 | Admitting: Student

## 2023-05-01 VITALS — BP 128/80 | HR 120 | Ht 63.0 in | Wt 277.6 lb

## 2023-05-01 DIAGNOSIS — R3129 Other microscopic hematuria: Secondary | ICD-10-CM | POA: Diagnosis not present

## 2023-05-01 DIAGNOSIS — R1110 Vomiting, unspecified: Secondary | ICD-10-CM

## 2023-05-01 DIAGNOSIS — I10 Essential (primary) hypertension: Secondary | ICD-10-CM

## 2023-05-01 DIAGNOSIS — D518 Other vitamin B12 deficiency anemias: Secondary | ICD-10-CM | POA: Diagnosis not present

## 2023-05-01 DIAGNOSIS — R197 Diarrhea, unspecified: Secondary | ICD-10-CM | POA: Diagnosis not present

## 2023-05-01 DIAGNOSIS — E878 Other disorders of electrolyte and fluid balance, not elsewhere classified: Secondary | ICD-10-CM

## 2023-05-01 MED ORDER — AMLODIPINE BESYLATE 10 MG PO TABS
10.0000 mg | ORAL_TABLET | Freq: Every evening | ORAL | 3 refills | Status: DC
Start: 2023-05-01 — End: 2023-09-07

## 2023-05-01 NOTE — Progress Notes (Unsigned)
  SUBJECTIVE:   CHIEF COMPLAINT / HPI:   Presents today for hospital follow-up in which she was admitted from 7/24-27 for nausea/vomiting and electrolyte abnormalities. Notes her nausea/vomiting have resolved. Her diarrhea has improved as well. She is taking her supplements. She has not taken her amlodipine because she is out of it. She has not heard from the GI doctor yet.   She already has scheduled follow-up with Dr. Marisue Humble on 8/5.  PCP Follow-up Recommendations: Please follow-up her microscopic hematuria. Recommend confirming on repeat UA.  Titrate BP regimen, remained elevated at time of discharge Please ensure she has follow-up with GI for potential EGD and for follow-up of transaminitis (likely 2/2 etOH, but some autoimmune hepatitis labs are pending). Follow-up renin-aldosterone level, she has already been started on spironolactone.  Still in process Follow-up celiac testing, fecal calprotectin, and pernicious anemia labs (Gastrin, intrinsic factor Ab, and anti-parietal cell Ab) Still in process Alcohol cessation would be beneficial. Please counsel.  PERTINENT  PMH / PSH: HTN, T2DM  Patient Care Team: Westley Chandler, MD as PCP - General (Family Medicine) OBJECTIVE:  BP 128/80   Pulse (!) 120   Ht 5\' 3"  (1.6 m)   Wt 277 lb 9.6 oz (125.9 kg)   SpO2 100%   BMI 49.17 kg/m  General: Well-appearing, NAD CV: RRR, no murmurs auscultated Abdomen: Soft, nontender, normoactive bowel sounds  ASSESSMENT/PLAN:  Vomiting and diarrhea Assessment & Plan: Resolved at this time.  I have provided information for GI to obtain follow-up.  Recheck labs today for electrolyte abnormalities, transaminitis, anemia, hematuria.  Several labs in process which will be followed up by Dr. Manson Passey.  Orders: -     Hepatic function panel  Essential hypertension Assessment & Plan: Well-controlled, advised to bring in medications at next visit.  Orders: -     amLODIPine Besylate; Take 1 tablet (10 mg  total) by mouth every evening.  Dispense: 90 tablet; Refill: 3  Electrolyte abnormality -     Basic metabolic panel -     Magnesium  Microscopic hematuria -     Urinalysis  Macrocytic anemia with vitamin B12 deficiency -     CBC  Shelby Mattocks, DO 05/02/2023, 7:34 AM PGY-3, [facility]

## 2023-05-01 NOTE — Patient Instructions (Addendum)
It was great to see you today! Thank you for choosing [facility] for your primary care. Norma Ramsey was seen for hospital follow-up.  Today we addressed: Dr. Manson Passey will be following up with you regarding the labs that were obtained in the hospital. Please bring all of your medications to your next visit, Dr. Marisue Humble will likely discuss your blood pressure with you at that time. I'm glad the recheck was normal. I have attached the information below for the GI doctor who you should call to set up an appointment with.  Jenel Lucks, MD. Schedule an appointment as soon as possible for a visit.   Specialty: Gastroenterology Why: Please make an appointment with Dr. Milas Hock office for followup in the next two weeks Contact information: [address] [place] [address] [phone]  If you haven't already, sign up for My Chart to have easy access to your labs results, and communication with your primary care physician.  We are checking some labs today. If they are abnormal, I will call you. If they are normal, I will send you a MyChart message (if it is active) or a letter in the mail. If you do not hear about your labs in the next 2 weeks, please call the office.  Please arrive 15 minutes before your appointment to ensure smooth check in process.  We appreciate your efforts in making this happen.  Thank you for allowing me to participate in your care, Shelby Mattocks, DO 05/01/2023, 3:04 PM PGY-3, [facility] Family Medicine

## 2023-05-02 NOTE — Assessment & Plan Note (Signed)
Resolved at this time.  I have provided information for GI to obtain follow-up.  Recheck labs today for electrolyte abnormalities, transaminitis, anemia, hematuria.  Several labs in process which will be followed up by Dr. Manson Passey.

## 2023-05-02 NOTE — Assessment & Plan Note (Signed)
Well-controlled, advised to bring in medications at next visit.

## 2023-05-03 ENCOUNTER — Other Ambulatory Visit: Payer: 59

## 2023-05-03 NOTE — Patient Instructions (Signed)
  Medicaid Managed Care   Unsuccessful Outreach Note  05/03/2023 Name: LANESHA HOFFERBER MRN: [id number] DOB: 12/19/1985  Referred by: Westley Chandler, MD Reason for referral : High Risk Managed Medicaid (MM Social work unsuccessful telephone outreach )   An unsuccessful telephone outreach was attempted today. The patient was referred to the case management team for assistance with care management and care coordination.   Follow Up Plan: The care management team will reach out to the patient again over the next 7-10 days.   Abelino Derrick, MHA [facility]  Managed [facility] Social Worker [phone]

## 2023-05-03 NOTE — Patient Outreach (Signed)
  Medicaid Managed Care   Unsuccessful Outreach Note  05/03/2023 Name: Norma Ramsey MRN: [id number] DOB: [birth year]  Referred by: Westley Chandler, MD Reason for referral : High Risk Managed Medicaid (MM Social work unsuccessful telephone outreach )   An unsuccessful telephone outreach was attempted today. The patient was referred to the case management team for assistance with care management and care coordination.   Follow Up Plan: The care management team will reach out to the patient again over the next 7-10 days.   Gus Puma, Kenard Gower, MHA [facility]  Managed [facility] Social Worker [phone] SIGNATURE

## 2023-05-04 ENCOUNTER — Telehealth: Payer: Self-pay | Admitting: Family Medicine

## 2023-05-04 NOTE — Telephone Encounter (Signed)
Patient LVM on nurse line returning call to provider regarding results.   Please return call to patient at [phone].  Veronda Prude, RN

## 2023-05-04 NOTE — Telephone Encounter (Signed)
Called patient about lab results from clinic visit on 07/29.  Reached voicemail box that is not set up. Attempt x1. Will try again.

## 2023-05-05 NOTE — Progress Notes (Signed)
Called patient regarding lab results. Updated, and discussed below:  Macrocytic anemia stable after discharge from hospital. Magnesium low at 1.4. Patient having some nausea but still feels better overall after discharge. Continues to take MagOx supplementation. Would recheck at follow-up on 08/05 Mild transaminitis. Would recheck at follow-up on 08/05  Will notify Dr. Marisue Humble who will see her.

## 2023-05-08 ENCOUNTER — Ambulatory Visit: Payer: 59 | Admitting: Student

## 2023-05-08 ENCOUNTER — Encounter: Payer: Self-pay | Admitting: Student

## 2023-05-08 VITALS — BP 136/80 | HR 108 | Wt 278.0 lb

## 2023-05-08 DIAGNOSIS — D518 Other vitamin B12 deficiency anemias: Secondary | ICD-10-CM | POA: Diagnosis not present

## 2023-05-08 DIAGNOSIS — E878 Other disorders of electrolyte and fluid balance, not elsewhere classified: Secondary | ICD-10-CM

## 2023-05-08 NOTE — Progress Notes (Signed)
    SUBJECTIVE:   CHIEF COMPLAINT / HPI:   Multiple Electrolyte Abnormalities Recent hospitalization for nausea vomiting found to have profound electrolyte abnormalities including hypocalcemia, hypokalemia, hypomagnesemia.  Also found to have hypovitaminosis B12 and D. Had a workup for potential malabsorptive disorder versus renal tubular disorder versus hypoaldosteronism versus downstream effects of heavy EtOH. Workup unrevealing thus far, significant only for weakly positive F-actin IgG at 21 (ULN 20).  Still awaiting renin-aldosterone activity ratio, that was started on spironolactone empirically given triad of difficult to control hypertension, profound hypokalemia, and anemia. Today she does me that she been taking all of her medication and supplements as prescribed.  Also tells me that she has "cut back" on alcohol but does not quantify this.   OBJECTIVE:   BP 136/80   Pulse (!) 108   Wt 278 lb (126.1 kg)   SpO2 98%   BMI 49.25 kg/m   Physical Exam Vitals reviewed.  Constitutional:      General: She is not in acute distress. Cardiovascular:     Rate and Rhythm: Normal rate and regular rhythm.  Pulmonary:     Effort: Pulmonary effort is normal. No respiratory distress.  Abdominal:     General: There is no distension.     Tenderness: There is no abdominal tenderness.  Musculoskeletal:        General: No swelling or deformity.  Neurological:     General: No focal deficit present.      ASSESSMENT/PLAN:   Electrolyte abnormality As above, differential remains broad.  Continues to include malabsorptive disorder versus renal tubular disorder versus hyperaldosteronism versus heavy EtOH use.  Thankfully she tells me she is cut back on EtOH.  Will repeat electrolytes today and adjust her supplement and follow-up scheduled accordingly.  I will note that I am encouraged by her much improved blood pressure today, the fact that she is responding so well to spironolactone does  increase my suspicion for hyperaldosteronism as a driver here. - Await renin-aldosterone level - BMP, Mag - CBC - Adjust supplements and follow-up pending lab results - Has GI follow-up in about 8 weeks      J Dorothyann Gibbs, MD [facility]

## 2023-05-08 NOTE — Patient Instructions (Signed)
Ms. Pawelek,  GREAT to see you out of the hospital. Let's re-check all of those labs we've been following. I will call you to discuss results and we can determine how far out to schedule your next visit based on those results. Hoepfuly more than a week out.  Keep taking ALL of the medications for now. When I call you, we can go over any changes in detail AND I will send a MyChart message so you have it in writing.    Eliezer Mccoy, MD

## 2023-05-09 ENCOUNTER — Other Ambulatory Visit: Payer: Self-pay | Admitting: *Deleted

## 2023-05-09 DIAGNOSIS — R109 Unspecified abdominal pain: Secondary | ICD-10-CM

## 2023-05-09 DIAGNOSIS — R748 Abnormal levels of other serum enzymes: Secondary | ICD-10-CM

## 2023-05-09 DIAGNOSIS — E538 Deficiency of other specified B group vitamins: Secondary | ICD-10-CM

## 2023-05-09 NOTE — Progress Notes (Signed)
Norma Ramsey, the tests looking for autoimmune causes of liver disease showed a very slightly elevated level of an antibody associated with autoimmune hepatitis.  The very low titer makes the significance questionable.  Your other labs are more suggestive of liver injury secondary to alcohol use.  A liver biopsy can help differentiate between autoimmune hepatitis and alcohol induced liver injury, but I don't think that it is necessary at this time.  I would recommend stopping alcohol and plan to repeat liver enzymes and antibody levels in 2 months.  If your enzymes are still abnormal in the setting of abstinence from alcohol, then we can consider liver biopsy at that time. The tests associated with autoimmune gastritis to explain your B12 deficiency were normal.  Norma Ramsey,  Can you please order a repeat CBC, CMP, GGT, IgG and anti-smooth muscle antibody titer to be drawn in 2 months?

## 2023-05-09 NOTE — Assessment & Plan Note (Addendum)
As above, differential remains broad.  Continues to include malabsorptive disorder versus renal tubular disorder versus hyperaldosteronism versus heavy EtOH use.  Thankfully she tells me she is cut back on EtOH.  Will repeat electrolytes today and adjust her supplement and follow-up scheduled accordingly.  I will note that I am encouraged by her much improved blood pressure today, the fact that she is responding so well to spironolactone does increase my suspicion for hyperaldosteronism as a driver here. - Await renin-aldosterone level - BMP, Mag - CBC - Adjust supplements and follow-up pending lab results - Has GI follow-up in about 8 weeks

## 2023-05-10 ENCOUNTER — Telehealth: Payer: Self-pay

## 2023-05-10 MED ORDER — POLYETHYLENE GLYCOL 3350 17 GM/SCOOP PO POWD: ORAL | 0 refills | Status: DC

## 2023-05-10 MED ORDER — SENNA 8.6 MG PO TABS: 1.0000 | ORAL_TABLET | Freq: Two times a day (BID) | ORAL | 0 refills | Status: AC | PRN

## 2023-05-10 NOTE — Telephone Encounter (Signed)
Patient calls nurse line requesting medication for constipation.   Patient reports last BM on Sunday, 05/07/23. She reports that this was a normal BM. She denies nausea or abdominal pain. She does report pain in rectal area.   She has not tried any OTC constipation medications as she was unsure if this would interact with any of her other medications.   Please advise medication recommendations for patient.   Provided with supportive measures.   Veronda Prude, RN

## 2023-05-10 NOTE — Telephone Encounter (Signed)
Called patient. Stop loperamide and scopolamine. Rx miralax and senna. Discussed side effects, how to take, and return precautions  Terisa Starr, MD  [facility] Medicine Teaching Service

## 2023-05-11 ENCOUNTER — Telehealth: Payer: Self-pay

## 2023-05-11 NOTE — Telephone Encounter (Signed)
..   Medicaid Managed Care   Unsuccessful Outreach Note  05/11/2023 Name: Norma Ramsey MRN: [id number] DOB: 06-08-1986  Referred by: Westley Chandler, MD Reason for referral : Appointment (I called to reschedule her missed phone appointment with the MM BSW. She did not answer and I was not able to leave a message.)   A second unsuccessful telephone outreach was attempted today. The patient was referred to the case management team for assistance with care management and care coordination.   Follow Up Plan: The care management team will reach out to the patient again over the next 7 days.   Weston Settle Care Guide  [facility] Managed  Care Guide [facility]  [phone]

## 2023-05-15 NOTE — Telephone Encounter (Signed)
Patient returns call to nurse line.   Feels that she has a "blockage." Feels like she has to have a bowel movement, however, is too painful.   Denies hardness or distention of abdomen, nausea or vomiting.   Intermittent abdominal pain. Patient describes as "cramping" pain. Reports continued rectal pain/pressure.  Very small bowel movement this morning.   Spoke with Dr. Linwood Dibbles who recommended at home enema and follow up.   Offered to schedule patient follow up appointment tomorrow. She declined and states that she will call back if she does not improve after the home enema.   Return precautions discussed.   Veronda Prude, RN

## 2023-05-16 ENCOUNTER — Ambulatory Visit: Payer: 59

## 2023-05-16 VITALS — BP 136/85 | HR 90 | Ht 63.0 in | Wt 283.4 lb

## 2023-05-16 DIAGNOSIS — K5900 Constipation, unspecified: Secondary | ICD-10-CM | POA: Diagnosis not present

## 2023-05-16 MED ORDER — BISACODYL 5 MG PO TBEC
5.0000 mg | DELAYED_RELEASE_TABLET | Freq: Every day | ORAL | 0 refills | Status: DC | PRN
Start: 2023-05-16 — End: 2023-05-29

## 2023-05-16 NOTE — Telephone Encounter (Signed)
Patient returns call to nurse line. She reports continued issues with constipation. She states that she tried enema last night, however, is still experiencing pain and discomfort in rectum.   Scheduled this afternoon in ATC for further evaluation.   Veronda Prude, RN

## 2023-05-16 NOTE — Patient Instructions (Addendum)
It was great to see you today! Thank you for choosing [facility] for your primary care.  Today we addressed: Starting at 8:00 a.m. or as early as possible, drink one water with miralax mixed in and take senna  Use the scoop of miralax 2-3 times a day Dulcolax suppository daily as well  Call if symptoms do not improve in a couple of days  Do not take the Loperamide or Scopolamine   If you haven't already, sign up for My Chart to have easy access to your labs results, and communication with your primary care physician.  I recommend that you always bring your medications to each appointment as this makes it easy to ensure you are on the correct medications and helps [place] not miss refills when you need them. Call the clinic at [phone] if your symptoms worsen or you have any concerns. Return in about 2 days (around 05/18/2023), or if symptoms worsen or fail to improve. Please arrive 15 minutes before your appointment to ensure smooth check in process.  We appreciate your efforts in making this happen.  Thank you for allowing me to participate in your care, Alfredo Martinez, MD 05/16/2023, 2:43 PM PGY-3, [facility] Family [facility]

## 2023-05-16 NOTE — Progress Notes (Unsigned)
  SUBJECTIVE:   CHIEF COMPLAINT / HPI:   Constipation:  -Present for over one week -Unable to walk properly secondary to rectal pain.   -Passing flatus: Unknown last time she passed flatus  -No hematochezia  -Does have liquid stool in underwear intermittently -Ever had bowel obstruction before/Intra-abdominal surgeries: No  -Next GI appt 07/25/23 -Last colonoscopy: Never  -Was taking Loperamide and Scopolamine after last admission to hospital that contributed to her constipation, stopped these   Per telephone note from nursing: Had last BM 05/07/23 and had normal BM. At that time, PCP stopped Loperamide and Scopolamine and was given Rx for MiraLAX and Senna. Patient then returned nursing line, felt like she had a "blockage" and needed to have a BM but felt it was too painful. Dr. Linwood Dibbles recommended an at home enema that was not beneficial, warranted office visit.   PERTINENT  PMH / PSH:  HTN DM2 Recently had admission for multiple electrolyte abnormalities of unknown cause    Patient Care Team: Westley Chandler, MD as PCP - General (Family Medicine) OBJECTIVE:  BP 136/85   Pulse 90   Ht 5\' 3"  (1.6 m)   Wt 283 lb 6.4 oz (128.5 kg)   SpO2 100%   BMI 50.20 kg/m  Physical Exam  General: Alert and oriented in no apparent distress Heart: Regular rate and rhythm with no murmurs appreciated Lungs: CTA bilaterally, no wheezing Abdomen: Bowel sounds present, no abdominal pain, non-distended, soft Rectal Exam: CMA chaperone in room, palpable stool ball in rectum, no hemorrhoids  Skin: Warm and dry Extremities: No lower extremity edema  ASSESSMENT/PLAN:  Constipation, unspecified constipation type Assessment & Plan: Likely 2/2 medication use. Has GI appt scheduled, will need colonoscopy. Still having liquid stool formation, BS present. ED precautions given. Provided Miralax clean out information, and dulcolax suppository. With shared decision making, will try to have bowel movement at  home but provided manual disimpaction with ED provider as option. Return at end of week if persisting.   Orders: -     Bisacodyl; Take 1 tablet (5 mg total) by mouth daily as needed for moderate constipation.  Dispense: 30 tablet; Refill: 0   Return in about 2 days (around 05/18/2023), or if symptoms worsen or fail to improve. Alfredo Martinez, MD 05/17/2023, 11:59 AM PGY-3, [facility]

## 2023-05-17 ENCOUNTER — Telehealth: Payer: Self-pay

## 2023-05-17 DIAGNOSIS — K5900 Constipation, unspecified: Secondary | ICD-10-CM | POA: Insufficient documentation

## 2023-05-17 NOTE — Telephone Encounter (Signed)
..   Medicaid Managed Care   Unsuccessful Outreach Note  05/17/2023 Name: Norma Ramsey MRN: [id number] DOB: 1986/07/07  Referred by: Westley Chandler, MD Reason for referral : Appointment   Third unsuccessful telephone outreach was attempted today. The patient was referred to the case management team for assistance with care management and care coordination. The patient's primary care provider has been notified of our unsuccessful attempts to make or maintain contact with the patient. The care management team is pleased to engage with this patient at any time in the future should he/she be interested in assistance from the care management team.   Follow Up Plan: We have been unable to make contact with the patient for follow up. The care management team is available to follow up with the patient after provider conversation with the patient regarding recommendation for care management engagement and subsequent re-referral to the care management team.   Weston Settle Care Guide  [facility] Managed  Care Guide [facility]  [phone]

## 2023-05-17 NOTE — Assessment & Plan Note (Signed)
Likely 2/2 medication use. Has GI appt scheduled, will need colonoscopy. Still having liquid stool formation, BS present. ED precautions given. Provided Miralax clean out information, and dulcolax suppository. With shared decision making, will try to have bowel movement at home but provided manual disimpaction with ED provider as option. Return at end of week if persisting.

## 2023-05-18 ENCOUNTER — Telehealth: Payer: Self-pay

## 2023-05-18 NOTE — Telephone Encounter (Signed)
Patient calls nurse line requesting to speak with provider.   She reports she went to the pharmacy and they did not have a suppository for her. She reports one was supposed to be called in.   She reports she has been taking Miralax and Senna for ~ 2 weeks, however she reports she has not had a "good BM." She reports she has been having some "leakage." She reports she feels she has a blockage and this is why she is "leaking." She denies any abdominal pain, nausea or vomiting. No blood.  Patient is requesting a "prescription" enema.   ED precautions discussed with patient.   GI apt is not until 10/22.  Will forward to provider who saw patient.

## 2023-05-19 NOTE — Telephone Encounter (Signed)
Patient returns call to nurse line regarding previous message.   Please advise.   Talbot Grumbling, RN

## 2023-05-20 ENCOUNTER — Other Ambulatory Visit: Payer: Self-pay

## 2023-05-20 ENCOUNTER — Emergency Department (HOSPITAL_BASED_OUTPATIENT_CLINIC_OR_DEPARTMENT_OTHER): Payer: 59

## 2023-05-20 ENCOUNTER — Other Ambulatory Visit (HOSPITAL_BASED_OUTPATIENT_CLINIC_OR_DEPARTMENT_OTHER): Payer: Self-pay

## 2023-05-20 ENCOUNTER — Emergency Department (HOSPITAL_BASED_OUTPATIENT_CLINIC_OR_DEPARTMENT_OTHER)
Admission: EM | Admit: 2023-05-20 | Discharge: 2023-05-20 | Disposition: A | Discharge status: Home / Self Care | Payer: 59 | Attending: Emergency Medicine | Admitting: Emergency Medicine

## 2023-05-20 DIAGNOSIS — R1084 Generalized abdominal pain: Secondary | ICD-10-CM | POA: Diagnosis present

## 2023-05-20 DIAGNOSIS — K5900 Constipation, unspecified: Secondary | ICD-10-CM | POA: Insufficient documentation

## 2023-05-20 LAB — CBC WITH DIFFERENTIAL/PLATELET
Abs Immature Granulocytes: 0.06 10*3/uL (ref 0.00–0.07)
Basophils Absolute: 0.1 10*3/uL (ref 0.0–0.1)
Basophils Relative: 1 %
Eosinophils Absolute: 0.2 10*3/uL (ref 0.0–0.5)
Eosinophils Relative: 2 %
HCT: 39.2 % (ref 36.0–46.0)
Hemoglobin: 13.8 g/dL (ref 12.0–15.0)
Immature Granulocytes: 1 %
Lymphocytes Relative: 16 %
Lymphs Abs: 1.7 10*3/uL (ref 0.7–4.0)
MCH: 37.3 pg — ABNORMAL HIGH (ref 26.0–34.0)
MCHC: 35.2 g/dL (ref 30.0–36.0)
MCV: 105.9 fL — ABNORMAL HIGH (ref 80.0–100.0)
Monocytes Absolute: 0.7 10*3/uL (ref 0.1–1.0)
Monocytes Relative: 7 %
Neutro Abs: 8.2 10*3/uL — ABNORMAL HIGH (ref 1.7–7.7)
Neutrophils Relative %: 73 %
Platelets: 402 10*3/uL — ABNORMAL HIGH (ref 150–400)
RBC: 3.7 MIL/uL — ABNORMAL LOW (ref 3.87–5.11)
RDW: 12.6 % (ref 11.5–15.5)
WBC: 10.9 10*3/uL — ABNORMAL HIGH (ref 4.0–10.5)
nRBC: 0 % (ref 0.0–0.2)

## 2023-05-20 LAB — COMPREHENSIVE METABOLIC PANEL
ALT: 19 U/L (ref 0–44)
AST: 21 U/L (ref 15–41)
Albumin: 3.9 g/dL (ref 3.5–5.0)
Alkaline Phosphatase: 117 U/L (ref 38–126)
Anion gap: 11 (ref 5–15)
BUN: 9 mg/dL (ref 6–20)
CO2: 25 mmol/L (ref 22–32)
Calcium: 9.2 mg/dL (ref 8.9–10.3)
Chloride: 100 mmol/L (ref 98–111)
Creatinine, Ser: 0.6 mg/dL (ref 0.44–1.00)
GFR, Estimated: >60 mL/min (ref >60)
Glucose, Bld: 133 mg/dL — ABNORMAL HIGH (ref 70–99)
Potassium: 4 mmol/L (ref 3.5–5.1)
Sodium: 136 mmol/L (ref 135–145)
Total Bilirubin: 0.5 mg/dL (ref 0.3–1.2)
Total Protein: 7.6 g/dL (ref 6.5–8.1)

## 2023-05-20 LAB — MAGNESIUM: Magnesium: 1.5 mg/dL — ABNORMAL LOW (ref 1.7–2.4)

## 2023-05-20 LAB — HCG, QUANTITATIVE, PREGNANCY: hCG, Beta Chain, Quant, S: <1 m[IU]/mL (ref <5)

## 2023-05-20 MED ORDER — MAGNESIUM OXIDE -MG SUPPLEMENT 400 (240 MG) MG PO TABS
400.0000 mg | ORAL_TABLET | Freq: Once | ORAL | Status: AC
  Administered 2023-05-20: 400 mg via ORAL
  Filled 2023-05-20: qty 1

## 2023-05-20 MED ORDER — IRBESARTAN 150 MG PO TABS
300.0000 mg | ORAL_TABLET | Freq: Every day | ORAL | Status: DC
  Administered 2023-05-20: 300 mg via ORAL
  Filled 2023-05-20: qty 2

## 2023-05-20 MED ORDER — SPIRONOLACTONE 12.5 MG HALF TABLET
25.0000 mg | ORAL_TABLET | Freq: Every day | ORAL | Status: DC
  Administered 2023-05-20: 25 mg via ORAL
  Filled 2023-05-20: qty 2

## 2023-05-20 MED ORDER — IOHEXOL 300 MG/ML  SOLN
100.0000 mL | Freq: Once | INTRAMUSCULAR | Status: AC | PRN
  Administered 2023-05-20: 100 mL via INTRAVENOUS

## 2023-05-20 MED ORDER — KETOROLAC TROMETHAMINE 15 MG/ML IJ SOLN
15.0000 mg | Freq: Once | INTRAMUSCULAR | Status: AC
  Administered 2023-05-20: 15 mg via INTRAVENOUS
  Filled 2023-05-20: qty 1

## 2023-05-20 MED ORDER — GOLYTELY 236 G PO SOLR
4000.0000 mL | Freq: Once | ORAL | 0 refills | Status: DC
  Filled 2023-05-20: qty 4000, 1d supply, fill #0

## 2023-05-20 MED ORDER — GOLYTELY 236 G PO SOLR
ORAL | 0 refills | Status: AC
  Filled 2023-05-20: qty 4000, 30d supply, fill #0

## 2023-05-20 NOTE — ED Notes (Signed)
Dc instructions reviewed with patient. Patient voiced understanding. Dc with belongings.  °

## 2023-05-20 NOTE — ED Triage Notes (Signed)
Pt has seen primary for constipation, she has been constipated for 2 weeks. She has been on miralax for bid and senna for 2 weeks. She is having severe pain in her pelvis/vagina/ and rectum. She can't sit comfortably.

## 2023-05-20 NOTE — ED Provider Notes (Signed)
[place] [facility]ERGENCY DEPARTMENT AT [facility] Provider Note   CSN: [id number] Arrival date & time: 05/20/23  4098     History {Add pertinent medical, surgical, social history, OB history to HPI:1} Chief Complaint  Patient presents with   Constipation    Shellie B Plautz is a 37 y.o. female.  Pt is a 37 yo female presenting for abdominal pain. Chart review demonstrates hospital admission from 7/24-7/27 for vomiting and diarrhea with electrolyte abnormalities including hypocalcemia, hypomagnesemia, and hypokalemia. Hx of daily drinking and marijuana use. Patient states during her admission she received scoplamine patches for nausea as well as "a medicine to help me stop having diarrhea". Pt states she has had no bowel movement in 2 weeks. She has tried Miramax, [employer], stool softeners, saline enemas, and warm water enemas.   The history is provided by the patient. No language interpreter was used.  Constipation Associated symptoms: abdominal pain   Associated symptoms: no back pain, no dysuria, no fever and no vomiting        Home Medications Prior to Admission medications   Medication Sig Start Date End Date Taking? Authorizing Provider  amLODipine (NORVASC) 10 MG tablet Take 1 tablet (10 mg total) by mouth every evening. 05/01/23   Shelby Mattocks, DO  bisacodyl 5 MG EC tablet Take 1 tablet (5 mg total) by mouth daily as needed for moderate constipation. 05/16/23   Alfredo Martinez, MD  calcium carbonate (OSCAL) 1500 (600 Ca) MG TABS tablet Take 1 tablet (1,500 mg total) by mouth 2 (two) times daily with a meal. 04/29/23   Jamerson Vonbargen Amel, MD  capsaicin (ZOSTRIX) 0.025 % cream Apply topically 2 (two) times daily. 04/29/23   Selin Eisler Amel, MD  cyanocobalamin (VITAMIN B12) 1000 MCG tablet Take 1 tablet (1,000 mcg total) by mouth daily. 04/29/23   Sabria Florido Amel, MD  Dulaglutide (TRULICITY) 0.75 MG/0.5ML SOPN Inject 0.75 mg weekly 01/20/23   Westley Chandler, MD  folic acid  (FOLVITE) 1 MG tablet Take 1 tablet (1 mg total) by mouth daily. 04/30/23   Olia Hinderliter Amel, MD  magnesium oxide (MAG-OX) 400 (240 Mg) MG tablet Take 1 tablet (400 mg total) by mouth daily. 04/29/23   Dohn Stclair Amel, MD  olmesartan (BENICAR) 40 MG tablet Take 1 tablet (40 mg total) by mouth daily. 04/29/23   Emerald Gehres Amel, MD  polyethylene glycol powder [facility]) 17 GM/SCOOP powder Mix 1 capful of miralax in 8 ounces of water twice daily for constipation 05/10/23   Westley Chandler, MD  potassium chloride SA (KLOR-CON M) 20 MEQ tablet Take 2 tablets (40 mEq total) by mouth daily. 04/29/23   Gavynn Duvall Amel, MD  senna (SENOKOT) 8.6 MG TABS tablet Take 1 tablet (8.6 mg total) by mouth 2 (two) times daily as needed for mild constipation. 05/10/23   Westley Chandler, MD  spironolactone (ALDACTONE) 25 MG tablet Take 1 tablet (25 mg total) by mouth daily. 04/30/23   Sherrick Araki Amel, MD  Vitamin D, Ergocalciferol, (DRISDOL) 1.25 MG (50000 UNIT) CAPS capsule Take 1 capsule (50,000 Units total) by mouth every 7 (seven) days. 05/04/23   Lygia Olaes Amel, MD      Allergies    Patient has no known allergies.    Review of Systems   Review of Systems  Constitutional:  Negative for chills and fever.  HENT:  Negative for ear pain and sore throat.   Eyes:  Negative for pain and visual disturbance.  Respiratory:  Negative  for cough and shortness of breath.   Cardiovascular:  Negative for chest pain and palpitations.  Gastrointestinal:  Positive for abdominal pain and constipation. Negative for vomiting.  Genitourinary:  Negative for dysuria and hematuria.  Musculoskeletal:  Negative for arthralgias and back pain.  Skin:  Negative for color change and rash.  Neurological:  Negative for seizures and syncope.  All other systems reviewed and are negative.   Physical Exam Updated Vital Signs BP (!) 161/117   Pulse (!) 105   Temp 98.3 F (36.8 C) (Oral)   Resp 20   SpO2 100%  Physical Exam Vitals and nursing  note reviewed.  Constitutional:      General: She is not in acute distress.    Appearance: She is well-developed.  HENT:     Head: Normocephalic and atraumatic.  Eyes:     Conjunctiva/sclera: Conjunctivae normal.  Cardiovascular:     Rate and Rhythm: Normal rate and regular rhythm.     Heart sounds: No murmur heard. Pulmonary:     Effort: Pulmonary effort is normal. No respiratory distress.     Breath sounds: Normal breath sounds.  Abdominal:     Palpations: Abdomen is soft.     Tenderness: There is generalized abdominal tenderness. There is no guarding or rebound.  Musculoskeletal:        General: No swelling.     Cervical back: Neck supple.  Skin:    General: Skin is warm and dry.     Capillary Refill: Capillary refill takes less than 2 seconds.  Neurological:     Mental Status: She is alert.  Psychiatric:        Mood and Affect: Mood normal.     ED Results / Procedures / Treatments   Labs (all labs ordered are listed, but only abnormal results are displayed) Labs Reviewed - No data to display  EKG None  Radiology No results found.  Procedures Procedures  {Document cardiac monitor, telemetry assessment procedure when appropriate:1}  Medications Ordered in ED Medications - No data to display  ED Course/ Medical Decision Making/ A&P   {   Click here for ABCD2, HEART and other calculatorsREFRESH Note before signing :1}                              Medical Decision Making Amount and/or Complexity of Data Reviewed Labs: ordered. Radiology: ordered.  Risk Prescription drug management.   8:46 AM 37 yo female presenting for abdominal pain [place] secondary to constipation.  Patient is alert and oriented x 3, hypertensive and tachypneic likely related to pain as well as missing antihypertensive medications this morning.  Abdomen is soft with tenderness in all quadrants.  No guarding or rigidity.  Will obtain CT abdomen and pelvis to rule out bowel obstruction  prior to treating.  Toradol given for pain.  Patient's home morning antihypertensive ordered.  Will hold Norvasc as patient takes that at night.  Laboratory studies demonstrate CT abdomen and pelvis with IV contrast demonstrates  {Document critical care time when appropriate:1} {Document review of labs and clinical decision tools ie heart score, Chads2Vasc2 etc:1}  {Document your independent review of radiology images, and any outside records:1} {Document your discussion with family members, caretakers, and with consultants:1} {Document social determinants of health affecting pt's care:1} {Document your decision making why or why not admission, treatments were needed:1} Final Clinical Impression(s) / ED Diagnoses Final diagnoses:  None    Rx /  DC Orders ED Discharge Orders     None

## 2023-05-20 NOTE — ED Triage Notes (Signed)
Pt was in hospital about a month ago. Pt has had electrolyte imbalance ,not sure why.

## 2023-05-22 NOTE — Telephone Encounter (Signed)
Patient went to [facility] ED on 8/17, and they didn't do anything to help. She also tried the Enema as well. Patient is still having the same issues not being able to pass the blockage, and she doesn't know what else to do. Patient still feels like she still has a ball. Patient would like for the provider to give her a call.  Aquilla Solian, CMA

## 2023-05-23 ENCOUNTER — Other Ambulatory Visit (HOSPITAL_BASED_OUTPATIENT_CLINIC_OR_DEPARTMENT_OTHER): Payer: Self-pay

## 2023-05-24 NOTE — Telephone Encounter (Signed)
Spoke with patient. Made appt in ATC at 4:10. Aquilla Solian, CMA

## 2023-05-25 ENCOUNTER — Other Ambulatory Visit: Payer: Self-pay

## 2023-05-25 ENCOUNTER — Ambulatory Visit (INDEPENDENT_AMBULATORY_CARE_PROVIDER_SITE_OTHER): Payer: 59 | Admitting: Student

## 2023-05-25 VITALS — BP 178/130 | HR 98 | Ht 63.0 in | Wt 283.4 lb

## 2023-05-25 DIAGNOSIS — I10 Essential (primary) hypertension: Secondary | ICD-10-CM

## 2023-05-25 DIAGNOSIS — K5903 Drug induced constipation: Secondary | ICD-10-CM

## 2023-05-25 NOTE — Progress Notes (Signed)
  SUBJECTIVE:   CHIEF COMPLAINT / HPI:   Constipation -Ongoing since last visit  -Saw ED Provider and had ordered Golytely while in ED but not covered by insurance, so was sent home   -CT scan with heavy stool burden on visit to ED  -has tried Senna, Miralax, saline enemas at home, Dulcolax, Miralax clean out  -Only had small watery stools since last visit  -Tried Golytely with no help -No vomiting or hematochezia   Has not taken her Olmesartan today, has taken Amlodipine.   PERTINENT  PMH / PSH:   HTN DM2 Constipation Electrolyte abnormality  Tobacco use   Patient Care Team: Westley Chandler, MD as PCP - General (Family Medicine) OBJECTIVE:  BP (!) 178/130   Pulse 98   Ht 5\' 3"  (1.6 m)   Wt 283 lb 6.4 oz (128.5 kg)   SpO2 100%   BMI 50.20 kg/m  Physical Exam  General: NAD, pleasant, able to participate in exam Respiratory: No respiratory distress Skin: warm and dry, no rashes noted Psych: Normal affect and mood Rectal: in rectal vault, palpable stool  Chaperone present   ASSESSMENT/PLAN:  Drug-induced constipation Assessment & Plan: Constipation in the setting of recent loperamide & scopolamine use, has discontinued these since constipation has begun.  Palpable stool in rectal vault, attempted manual disimpaction with minimal relief.  Unable to fully reach stool ball.  Patient has tried multiple treatments with home water enemas, saline enema, GoLytely, MiraLAX, senna, Docusate.  CT scan from ED visit showed heavy stool burden with stool ball.  Worry about the possibility of stercoral colitis.  Instructed the patient I would like for her to go to the emergency room to receive large-volume enemas in order to try to relieve her of constipation.  She reported that she wanted to go home to try to have a bowel movement first.  I discussed that there are risks associated with not going to the emergency room with patient, politely declined ED visit.  She did report however that  if she is unable to have a bowel movement, she will return to the ED here at [facility].  Provided the patient with a letter to ED to provide large-volume enemas and if unable to relieve constipation, contact FMTS for admission.  Also messaged FM TS team about this patient.  Return in 2 weeks for further evaluation and blood pressure check.    Essential hypertension Assessment & Plan: Poor control, has not yet taken medication and involved in stressful procedure today. Recommended ED visit, declined but reported she would go tomorrow if symptoms remain. Return in 2 weeks for BP check, take Olmesartan at home.      Alfredo Martinez, MD 05/25/2023, 5:07 PM PGY-3, [facility]

## 2023-05-25 NOTE — Assessment & Plan Note (Signed)
Poor control, has not yet taken medication and involved in stressful procedure today. Recommended ED visit, declined but reported she would go tomorrow if symptoms remain. Return in 2 weeks for BP check, take Olmesartan at home.

## 2023-05-25 NOTE — Assessment & Plan Note (Signed)
Constipation in the setting of recent loperamide & scopolamine use, has discontinued these since constipation has begun.  Palpable stool in rectal vault, attempted manual disimpaction with minimal relief.  Unable to fully reach stool ball.  Patient has tried multiple treatments with home water enemas, saline enema, GoLytely, MiraLAX, senna, Docusate.  CT scan from ED visit showed heavy stool burden with stool ball.  Worry about the possibility of stercoral colitis.  Instructed the patient I would like for her to go to the emergency room to receive large-volume enemas in order to try to relieve her of constipation.  She reported that she wanted to go home to try to have a bowel movement first.  I discussed that there are risks associated with not going to the emergency room with patient, politely declined ED visit.  She did report however that if she is unable to have a bowel movement, she will return to the ED here at [facility].  Provided the patient with a letter to ED to provide large-volume enemas and if unable to relieve constipation, contact FMTS for admission.  Also messaged FM TS team about this patient.  Return in 2 weeks for further evaluation and blood pressure check.

## 2023-05-25 NOTE — Patient Instructions (Addendum)
It was great to see you today! Thank you for choosing [facility] for your primary care.  Today we addressed: We will send to the hospital for enema and subsequent admission if not    If you haven't already, sign up for My Chart to have easy access to your labs results, and communication with your primary care physician. I recommend that you always bring your medications to each appointment as this makes it easy to ensure you are on the correct medications and helps [place] not miss refills when you need them. Call the clinic at [phone] if your symptoms worsen or you have any concerns.   Please arrive 15 minutes before your appointment to ensure smooth check in process.  We appreciate your efforts in making this happen.  Thank you for allowing me to participate in your care, Alfredo Martinez, MD 05/25/2023, 4:41 PM PGY-3, [facility]

## 2023-05-26 ENCOUNTER — Observation Stay (HOSPITAL_COMMUNITY)
Admission: EM | Admit: 2023-05-26 | Discharge: 2023-05-29 | Disposition: A | Discharge status: Home / Self Care | Payer: 59 | Attending: Family Medicine | Admitting: Family Medicine

## 2023-05-26 ENCOUNTER — Other Ambulatory Visit: Payer: Self-pay

## 2023-05-26 DIAGNOSIS — F17200 Nicotine dependence, unspecified, uncomplicated: Secondary | ICD-10-CM | POA: Diagnosis not present

## 2023-05-26 DIAGNOSIS — I10 Essential (primary) hypertension: Secondary | ICD-10-CM | POA: Insufficient documentation

## 2023-05-26 DIAGNOSIS — K5900 Constipation, unspecified: Secondary | ICD-10-CM

## 2023-05-26 DIAGNOSIS — K5903 Drug induced constipation: Secondary | ICD-10-CM

## 2023-05-26 DIAGNOSIS — K5641 Fecal impaction: Secondary | ICD-10-CM | POA: Diagnosis present

## 2023-05-26 DIAGNOSIS — Z79899 Other long term (current) drug therapy: Secondary | ICD-10-CM | POA: Diagnosis not present

## 2023-05-26 DIAGNOSIS — E878 Other disorders of electrolyte and fluid balance, not elsewhere classified: Secondary | ICD-10-CM | POA: Diagnosis not present

## 2023-05-26 DIAGNOSIS — R109 Unspecified abdominal pain: Secondary | ICD-10-CM | POA: Insufficient documentation

## 2023-05-26 LAB — COMPREHENSIVE METABOLIC PANEL
ALT: 24 U/L (ref 0–44)
AST: 22 U/L (ref 15–41)
Albumin: 3.6 g/dL (ref 3.5–5.0)
Alkaline Phosphatase: 112 U/L (ref 38–126)
Anion gap: 16 — ABNORMAL HIGH (ref 5–15)
BUN: 5 mg/dL — ABNORMAL LOW (ref 6–20)
CO2: 18 mmol/L — ABNORMAL LOW (ref 22–32)
Calcium: 9.6 mg/dL (ref 8.9–10.3)
Chloride: 101 mmol/L (ref 98–111)
Creatinine, Ser: 0.51 mg/dL (ref 0.44–1.00)
GFR, Estimated: >60 mL/min (ref >60)
Glucose, Bld: 167 mg/dL — ABNORMAL HIGH (ref 70–99)
Potassium: 3.2 mmol/L — ABNORMAL LOW (ref 3.5–5.1)
Sodium: 135 mmol/L (ref 135–145)
Total Bilirubin: 0.6 mg/dL (ref 0.3–1.2)
Total Protein: 7.7 g/dL (ref 6.5–8.1)

## 2023-05-26 LAB — CBC WITH DIFFERENTIAL/PLATELET
Abs Immature Granulocytes: 0.07 10*3/uL (ref 0.00–0.07)
Basophils Absolute: 0 10*3/uL (ref 0.0–0.1)
Basophils Relative: 0 %
Eosinophils Absolute: 0.1 10*3/uL (ref 0.0–0.5)
Eosinophils Relative: 1 %
HCT: 42.2 % (ref 36.0–46.0)
Hemoglobin: 15 g/dL (ref 12.0–15.0)
Immature Granulocytes: 0 %
Lymphocytes Relative: 18 %
Lymphs Abs: 3 10*3/uL (ref 0.7–4.0)
MCH: 37.4 pg — ABNORMAL HIGH (ref 26.0–34.0)
MCHC: 35.5 g/dL (ref 30.0–36.0)
MCV: 105.2 fL — ABNORMAL HIGH (ref 80.0–100.0)
Monocytes Absolute: 1 10*3/uL (ref 0.1–1.0)
Monocytes Relative: 6 %
Neutro Abs: 12.3 10*3/uL — ABNORMAL HIGH (ref 1.7–7.7)
Neutrophils Relative %: 75 %
Platelets: 416 10*3/uL — ABNORMAL HIGH (ref 150–400)
RBC: 4.01 MIL/uL (ref 3.87–5.11)
RDW: 12.6 % (ref 11.5–15.5)
WBC: 16.5 10*3/uL — ABNORMAL HIGH (ref 4.0–10.5)
nRBC: 0 % (ref 0.0–0.2)

## 2023-05-26 LAB — URINALYSIS, ROUTINE W REFLEX MICROSCOPIC
Bilirubin Urine: NEGATIVE
Glucose, UA: NEGATIVE mg/dL
Ketones, ur: NEGATIVE mg/dL
Leukocytes,Ua: NEGATIVE
Nitrite: NEGATIVE
Protein, ur: NEGATIVE mg/dL
Specific Gravity, Urine: 1.002 — ABNORMAL LOW (ref 1.005–1.030)
pH: 6 (ref 5.0–8.0)

## 2023-05-26 LAB — HCG, SERUM, QUALITATIVE: Preg, Serum: NEGATIVE

## 2023-05-26 NOTE — ED Triage Notes (Signed)
Patient reports chronic constipation/fecal impaction for several weeks

## 2023-05-27 ENCOUNTER — Encounter (HOSPITAL_COMMUNITY): Payer: Self-pay | Admitting: Family Medicine

## 2023-05-27 DIAGNOSIS — E878 Other disorders of electrolyte and fluid balance, not elsewhere classified: Secondary | ICD-10-CM | POA: Diagnosis not present

## 2023-05-27 DIAGNOSIS — K5641 Fecal impaction: Principal | ICD-10-CM | POA: Diagnosis present

## 2023-05-27 DIAGNOSIS — I10 Essential (primary) hypertension: Secondary | ICD-10-CM | POA: Diagnosis not present

## 2023-05-27 DIAGNOSIS — R109 Unspecified abdominal pain: Secondary | ICD-10-CM | POA: Insufficient documentation

## 2023-05-27 MED ORDER — IBUPROFEN 200 MG PO TABS
600.0000 mg | ORAL_TABLET | Freq: Four times a day (QID) | ORAL | Status: DC | PRN
  Administered 2023-05-27 – 2023-05-28 (×3): 600 mg via ORAL
  Filled 2023-05-27 (×3): qty 3

## 2023-05-27 MED ORDER — AMLODIPINE BESYLATE 5 MG PO TABS
10.0000 mg | ORAL_TABLET | Freq: Once | ORAL | Status: AC
  Administered 2023-05-27: 10 mg via ORAL
  Filled 2023-05-27: qty 2

## 2023-05-27 MED ORDER — MILK AND MOLASSES ENEMA
1.0000 | Freq: Once | RECTAL | Status: AC
  Administered 2023-05-27: 240 mL via RECTAL
  Filled 2023-05-27: qty 240

## 2023-05-27 MED ORDER — IRBESARTAN 300 MG PO TABS
300.0000 mg | ORAL_TABLET | Freq: Every day | ORAL | Status: DC
  Administered 2023-05-27 – 2023-05-29 (×3): 300 mg via ORAL
  Filled 2023-05-27 (×3): qty 1

## 2023-05-27 MED ORDER — POLYETHYLENE GLYCOL 3350 17 GM/SCOOP PO POWD
1.0000 | Freq: Once | ORAL | Status: DC
  Filled 2023-05-27: qty 255

## 2023-05-27 MED ORDER — FOLIC ACID 1 MG PO TABS
1.0000 mg | ORAL_TABLET | Freq: Every day | ORAL | Status: DC
  Administered 2023-05-27 – 2023-05-29 (×3): 1 mg via ORAL
  Filled 2023-05-27 (×3): qty 1

## 2023-05-27 MED ORDER — ACETAMINOPHEN 500 MG PO TABS
1000.0000 mg | ORAL_TABLET | Freq: Four times a day (QID) | ORAL | Status: DC | PRN
  Administered 2023-05-29: 1000 mg via ORAL
  Filled 2023-05-27: qty 2

## 2023-05-27 MED ORDER — SPIRONOLACTONE 25 MG PO TABS
25.0000 mg | ORAL_TABLET | Freq: Every day | ORAL | Status: DC
  Administered 2023-05-27 – 2023-05-29 (×3): 25 mg via ORAL
  Filled 2023-05-27 (×3): qty 1

## 2023-05-27 MED ORDER — KETOROLAC TROMETHAMINE 30 MG/ML IJ SOLN
15.0000 mg | Freq: Once | INTRAMUSCULAR | Status: AC
  Administered 2023-05-27: 15 mg via INTRAVENOUS
  Filled 2023-05-27: qty 1

## 2023-05-27 MED ORDER — POTASSIUM CHLORIDE CRYS ER 20 MEQ PO TBCR
40.0000 meq | EXTENDED_RELEASE_TABLET | Freq: Once | ORAL | Status: AC
  Administered 2023-05-27: 40 meq via ORAL
  Filled 2023-05-27: qty 2

## 2023-05-27 MED ORDER — VITAMIN B-12 1000 MCG PO TABS
1000.0000 ug | ORAL_TABLET | Freq: Every day | ORAL | Status: DC
  Administered 2023-05-27 – 2023-05-29 (×3): 1000 ug via ORAL
  Filled 2023-05-27 (×3): qty 1

## 2023-05-27 MED ORDER — AMLODIPINE BESYLATE 10 MG PO TABS
10.0000 mg | ORAL_TABLET | Freq: Every evening | ORAL | Status: DC
  Administered 2023-05-27 – 2023-05-29 (×3): 10 mg via ORAL
  Filled 2023-05-27 (×3): qty 1

## 2023-05-27 MED ORDER — IRBESARTAN 300 MG PO TABS: 300.0000 mg | ORAL_TABLET | Freq: Every day | ORAL | Status: DC

## 2023-05-27 MED ORDER — POLYETHYLENE GLYCOL 3350 17 G PO PACK
17.0000 g | PACK | Freq: Two times a day (BID) | ORAL | Status: DC
  Administered 2023-05-28: 17 g via ORAL
  Filled 2023-05-27 (×3): qty 1

## 2023-05-27 MED ORDER — ACETAMINOPHEN 500 MG PO TABS: 1000.0000 mg | ORAL_TABLET | Freq: Four times a day (QID) | ORAL | Status: DC | PRN

## 2023-05-27 MED ORDER — ACETAMINOPHEN 325 MG PO TABS
650.0000 mg | ORAL_TABLET | Freq: Four times a day (QID) | ORAL | Status: DC | PRN
  Administered 2023-05-27: 650 mg via ORAL
  Filled 2023-05-27: qty 2

## 2023-05-27 MED ORDER — CALCIUM CARBONATE 1250 (500 CA) MG PO TABS
1250.0000 mg | ORAL_TABLET | Freq: Two times a day (BID) | ORAL | Status: DC
  Administered 2023-05-27 – 2023-05-28 (×3): 1250 mg via ORAL
  Filled 2023-05-27 (×3): qty 1

## 2023-05-27 MED ORDER — SPIRONOLACTONE 25 MG PO TABS: 25.0000 mg | ORAL_TABLET | Freq: Every day | ORAL | Status: DC

## 2023-05-27 NOTE — Progress Notes (Signed)
Received secure chat from RN that patient was upset with GoLytely and requesting to speak with MD. Norma Ramsey to bedside. Patient annoyed at "continuing to do the same things I did at home without any changes. I don't know why I even came to the hospital."   Listened to patient's concerns about increased pain with straining and passing fluid rather than stool. Shared decision making to take a break from bowel prep tonight and readdress tomorrow once she has had a night to sleep.   Remainder of plan per day team.   Glendale Chard, DO [facility], PGY-2 05/27/23 9:05 PM

## 2023-05-27 NOTE — H&P (Addendum)
Hospital Admission History and Physical Service Pager: [phone]  Patient name: Norma Ramsey Medical record number: [id number] Date of Birth: [birth year] Age: 37 y.o. Gender: female  Primary Care Provider: Westley Chandler, MD Consultants: none Code Status: FULL    Chief Complaint: fecal impaction, constipation  Assessment and Plan: Norma Ramsey is a 37 y.o. female presenting with constipation x1 month and known stool ball in rectum. Differential for presentation of this includes drug induced, electrolyte disturbance, colonic stricture, stercoral colitis. I suspect this could be due to pt's course of loperamide after d/c from the hospital in addition to the twice daily calcium supplement. Low suspicion for mechanical colonic cause with pt's normal CT A/P 1 week ago. Pt does have leukocytosis at 16.2 but is afebrile and non-tender, non-distended on exam so low suspicion for stercoral colitis at present.   Assessment & Plan Fecal impaction in rectum (HCC) Constipation x1 month. Stool ball palpated at [facility] 8/23, advised to come to ED for large volume enema and potential admission. Soaps suds enema in the ED with minimal yield. Last CT A/P showed large stool volume in colon. Leukocytosis at 16.5 noted.  - med-surg, observation status - milk and molasses enema, if ineffective would trial large volume GoLytely - repeat CBC in am Essential hypertension Pt with some initial elevated BP readings. States she did not take her evening dose of amlodipine last night. - Continue home medication regimen - amlodipine 10mg  daily, irbesartan 300mg  daily (home is olmesartan 40), spironolactone 25mg  daily Electrolyte abnormality Noted to have K 3.2 on initial CMP. Notably recently admitted with hypoCa, hypo K, and hypoMg. Thought to potentially be due to EtOH use vs malabsorptive disorder.  - repleted K - am BMP and Mg, replete lytes as indicated   Chronic and Stable Problems:  T2DM - trulicity  0.75mg  weekly Vitamin D deficiency - 50,000U once a week B12 deficiency - QD   FEN/GI: regular VTE Prophylaxis: SCDs  Disposition: med-surg, observation status  History of Present Illness:  Norma Ramsey is a 37 y.o. female presenting with constipation x1 month. Pt states that she was admitted to the hospital 7/24-7/27 for V/D with electrolyte abnormalities and was given Loperamide 2mg  four times daily for diarrhea. States her constipation started about 1 week later. She has tried Miralax, Senokot, stool softeners, saline enemas, warm water enemas. She was seen in the ED 8/17 and rx GoLytely with no improvement. Pt was then seen at [facility] for constipation where a disimpaction was attempted. Per chart review they felt a stool ball and were unable to remove it, pt was recommended to come to the ED for a large volume enema.  Denies fever, chills, nausea, vomiting.   In the ED, pt given soapsuds enema with just very small amount of soft stool. No relief of pt's pain.   Review Of Systems: Per HPI with the following additions: none  Pertinent Past Medical History: HTN, prediabetes, tobacco use Remainder reviewed in history tab.   Pertinent Past Surgical History: none  Remainder reviewed in history tab.   Pertinent Social History: Tobacco use: yes, 0.3 PPD Alcohol use: yes, 1-2 shots per day Other Substance use: marijuana daily Lives with son  Pertinent Family History: Mother- HTN Father- HTN  Remainder reviewed in history tab.   Important Outpatient Medications: Amlodipine 10mg  every day, olmesartan 40mg  every day, spironolactone 25mg  QD Trulicity 0.75mg  weekly Magnesium 400mg  every day Potassium chloride 40 mEq Calcium 1500mg  BID  Remainder reviewed  in medication history.   Objective: BP (!) 195/133   Pulse 84   Temp 98.7 F (37.1 C) (Oral)   Resp 18   SpO2 97%  Exam: General: well appearing, no acute distress Eyes: EOMI ENTM: MMM Cardiovascular: RRR, no  murmurs Respiratory: CTAB, no increased work of breathing Gastrointestinal: Normal bowel sounds. No distention. No TTP on exam. Neuro: A&Ox3 Psych: Normal affect  Labs:  CBC BMET  Recent Labs  Lab 05/26/23 2033  WBC 16.5*  HGB 15.0  HCT 42.2  PLT 416*   Recent Labs  Lab 05/26/23 2033  NA 135  K 3.2*  CL 101  CO2 18*  BUN 5*  CREATININE 0.51  GLUCOSE 167*  CALCIUM 9.6     Imaging Studies Performed:  I independently reviewed pt's last CT scan.  CT A/P from last ED visit (8/17) with large stool volume but unremarkable for any other acute process.   No new imaging today.   Everhart, Kirstie, DO 05/27/2023, 5:19 AM PGY-1, [facility]  FPTS Intern pager: [phone], text pages welcome Secure chat group [facility] Teaching Service     I have evaluated this patient along with Dr. Rexene Alberts and reviewed the above note, making necessary revisions.  Dorothyann Gibbs, MD 05/27/2023, 5:28 AM PGY-3, [facility]

## 2023-05-27 NOTE — Hospital Course (Addendum)
Norma Ramsey is a 37 y.o.female with a history of HTN, T2DM, who was admitted to the [facility] Teaching Service at [facility] for constipation. Her hospital course is detailed below:  Constipation Presented to the ED after being seen in clinic for constipation x 1 month suspected to be related to drug-induced causes by receiving loperamide during hospital admission 1 month prior.  She was unsuccessful with stool softeners, warm water enema, saline enema.  Milk and molasses enema during hospitalization was unsuccessful.  She was given GoLytely bowel prep.***  Other chronic conditions were medically managed with home medications and formulary alternatives as necessary (HTN, T2DM, vitamin deficiency)  PCP Follow-up Recommendations:

## 2023-05-27 NOTE — ED Notes (Addendum)
BP 195/133 MD Everhart made aware.

## 2023-05-27 NOTE — Progress Notes (Signed)
MD made aware  05/27/23 0542 05/27/23 0545  Vitals  Temp 98 F (36.7 C)  --   Temp Source Oral  --   BP (!) 167/123 (!) 169/124  MAP (mmHg) 136 139  BP Location Right Arm Right Arm  BP Method Automatic Automatic  Patient Position (if appropriate) Lying Lying  Pulse Rate (!) 104 99  Pulse Rate Source Monitor Monitor

## 2023-05-27 NOTE — Assessment & Plan Note (Addendum)
Constipation x1 month. Stool ball palpated at [facility] 8/23, advised to come to ED for large volume enema and potential admission. Soaps suds enema in the ED with minimal yield. Last CT A/P showed large stool volume in colon. Leukocytosis at 16.5 noted.  - med-surg, observation status - milk and molasses enema, if ineffective would trial large volume GoLytely - repeat CBC in am

## 2023-05-27 NOTE — Assessment & Plan Note (Addendum)
Pt with some initial elevated BP readings. States she did not take her evening dose of amlodipine last night. - Continue home medication regimen - amlodipine 10mg  daily, irbesartan 300mg  daily (home is olmesartan 40), spironolactone 25mg  daily

## 2023-05-27 NOTE — Assessment & Plan Note (Addendum)
Noted to have K 3.2 on initial CMP. Notably recently admitted with hypoCa, hypo K, and hypoMg. Thought to potentially be due to EtOH use vs malabsorptive disorder.  - repleted K - am BMP and Mg, replete lytes as indicated

## 2023-05-27 NOTE — ED Notes (Signed)
ED TO INPATIENT HANDOFF REPORT  ED Nurse Name and Phone [phone]  S Name/Age/Gender Norma Ramsey 37 y.o. female Room/Bed: 025C/025C  Code Status   Code Status: Full Code  Home/SNF/Other Home Patient oriented to: self, place, time, and situation Is this baseline? Yes   Triage Complete: Triage complete  Chief Complaint Fecal impaction in rectum [facility]) [K56.41]  Triage Note Patient reports chronic constipation/fecal impaction for several weeks    Allergies No Known Allergies  Level of Care/Admitting Diagnosis ED Disposition     ED Disposition  Admit   Condition  --   Comment  Hospital Area: [facility] [[id number]]  Level of Care: Med-Surg [16]  May place patient in observation at [facility] or [facility] if equivalent level of care is available:: No  Covid Evaluation: Asymptomatic - no recent exposure (last 10 days) testing not required  Diagnosis: Fecal impaction in rectum [facility]) [[id number]]  Admitting Physician: Para March [[id number]]  Attending Physician: Caro Laroche [[id number]]          B Medical/Surgery History Past Medical History:  Diagnosis Date   Acute pain of left knee 06/03/2020   Hypertension    Obesity    Prediabetes    Tobacco abuse    Past Surgical History:  Procedure Laterality Date   INSERTION OF IMPLANON ROD  05/09/2022   REMOVAL OF IMPLANON ROD  05/09/2022   WISDOM TOOTH EXTRACTION  09/2016     A IV Location/Drains/Wounds Patient Lines/Drains/Airways Status     Active Line/Drains/Airways     Name Placement date Placement time Site Days   Peripheral IV 05/27/23 20 G Left Antecubital 05/27/23  0408  Antecubital  less than 1            Intake/Output Last 24 hours No intake or output data in the 24 hours ending 05/27/23 0506  Labs/Imaging Results for orders placed or performed during the hospital encounter of 05/26/23 (from the past 48 hour(s))  CBC with Differential     Status: Abnormal   Collection  Time: 05/26/23  8:33 PM  Result Value Ref Range   WBC 16.5 (H) 4.0 - 10.5 K/uL   RBC 4.01 3.87 - 5.11 MIL/uL   Hemoglobin 15.0 12.0 - 15.0 g/dL   HCT 01.0 27.2 - 53.6 %   MCV 105.2 (H) 80.0 - 100.0 fL   MCH 37.4 (H) 26.0 - 34.0 pg   MCHC 35.5 30.0 - 36.0 g/dL   RDW 64.4 03.4 - 74.2 %   Platelets 416 (H) 150 - 400 K/uL   nRBC 0.0 0.0 - 0.2 %   Neutrophils Relative % 75 %   Neutro Abs 12.3 (H) 1.7 - 7.7 K/uL   Lymphocytes Relative 18 %   Lymphs Abs 3.0 0.7 - 4.0 K/uL   Monocytes Relative 6 %   Monocytes Absolute 1.0 0.1 - 1.0 K/uL   Eosinophils Relative 1 %   Eosinophils Absolute 0.1 0.0 - 0.5 K/uL   Basophils Relative 0 %   Basophils Absolute 0.0 0.0 - 0.1 K/uL   Immature Granulocytes 0 %   Abs Immature Granulocytes 0.07 0.00 - 0.07 K/uL    Comment: Performed at [facility], [address] [place], [place] [address]  Comprehensive metabolic panel     Status: Abnormal   Collection Time: 05/26/23  8:33 PM  Result Value Ref Range   Sodium 135 135 - 145 mmol/L   Potassium 3.2 (L) 3.5 - 5.1 mmol/L   Chloride 101  98 - 111 mmol/L   CO2 18 (L) 22 - 32 mmol/L   Glucose, Bld 167 (H) 70 - 99 mg/dL    Comment: Glucose reference range applies only to samples taken after fasting for at least 8 hours.   BUN 5 (L) 6 - 20 mg/dL   Creatinine, Ser 6.44 0.44 - 1.00 mg/dL   Calcium 9.6 8.9 - 03.4 mg/dL   Total Protein 7.7 6.5 - 8.1 g/dL   Albumin 3.6 3.5 - 5.0 g/dL   AST 22 15 - 41 U/L   ALT 24 0 - 44 U/L   Alkaline Phosphatase 112 38 - 126 U/L   Total Bilirubin 0.6 0.3 - 1.2 mg/dL   GFR, Estimated >74 >25 mL/min    Comment: (NOTE) Calculated using the CKD-EPI Creatinine Equation (2021)    Anion gap 16 (H) 5 - 15    Comment: Performed at [facility], [address], [place], [place] [address]  hCG, serum, qualitative     Status: None   Collection Time: 05/26/23  8:33 PM  Result Value Ref Range   Preg, Serum NEGATIVE NEGATIVE    Comment:        THE SENSITIVITY OF  THIS METHODOLOGY IS >10 mIU/mL. Performed at [facility], [address], [place], [place] [address]   Urinalysis, Routine w reflex microscopic -Urine, Clean Catch     Status: Abnormal   Collection Time: 05/26/23  8:44 PM  Result Value Ref Range   Color, Urine STRAW (A) YELLOW   APPearance HAZY (A) CLEAR   Specific Gravity, Urine 1.002 (L) 1.005 - 1.030   pH 6.0 5.0 - 8.0   Glucose, UA NEGATIVE NEGATIVE mg/dL   Hgb urine dipstick SMALL (A) NEGATIVE   Bilirubin Urine NEGATIVE NEGATIVE   Ketones, ur NEGATIVE NEGATIVE mg/dL   Protein, ur NEGATIVE NEGATIVE mg/dL   Nitrite NEGATIVE NEGATIVE   Leukocytes,Ua NEGATIVE NEGATIVE   RBC / HPF 0-5 0 - 5 RBC/hpf   WBC, UA 0-5 0 - 5 WBC/hpf   Bacteria, UA FEW (A) NONE SEEN   Squamous Epithelial / HPF 6-10 0 - 5 /HPF    Comment: Performed at [facility], [address], [place], [place] [address]   No results found.  Pending Labs Unresulted Labs (From admission, onward)     Start     Ordered   05/28/23 0500  CBC  Tomorrow morning,   R        05/27/23 0452   05/28/23 0500  Basic metabolic panel  Tomorrow morning,   R        05/27/23 0502   05/28/23 0500  Magnesium  Tomorrow morning,   R        05/27/23 0502            Vitals/Pain Today's Vitals   05/27/23 0400 05/27/23 0415 05/27/23 0430 05/27/23 0445  BP: (!) 147/137 (!) 174/138 (!) 187/116 (!) 195/133  Pulse:  90 93 84  Resp:      Temp:      TempSrc:      SpO2:  100% 96% 97%  PainSc:        Isolation Precautions No active isolations  Medications Medications  amLODipine (NORVASC) tablet 10 mg (has no administration in time range)  irbesartan (AVAPRO) tablet 300 mg (has no administration in time range)  spironolactone (ALDACTONE) tablet 25 mg (has no administration in time range)  calcium carbonate (OS-CAL - dosed in mg of  elemental calcium) tablet 1,250 mg (has no administration in time range)  milk and molasses enema (has no administration in time range)   cyanocobalamin (VITAMIN B12) tablet 1,000 mcg (has no administration in time range)  folic acid (FOLVITE) tablet 1 mg (has no administration in time range)  potassium chloride SA (KLOR-CON M) CR tablet 40 mEq (has no administration in time range)  amLODipine (NORVASC) tablet 10 mg (10 mg Oral Given 05/27/23 0400)  ketorolac (TORADOL) 30 MG/ML injection 15 mg (15 mg Intravenous Given 05/27/23 0400)    Mobility walks     Focused Assessments Fecal Impaction   R Recommendations: See Admitting Provider Note  Report given to:   Additional Notes: N/a

## 2023-05-27 NOTE — Progress Notes (Signed)
MD made aware, no new orders  05/27/23 2115  Vitals  Temp 98.8 F (37.1 C)  Temp Source Oral  BP (!) 182/130  BP Location Right Arm  BP Method Automatic  Patient Position (if appropriate) Sitting  Pulse Rate Source Dinamap  Resp 20  Level of Consciousness  Level of Consciousness Alert  MEWS COLOR  MEWS Score Color Green  Oxygen Therapy  SpO2 99 %  O2 Device Room Air  Pain Assessment  Pain Scale 0-10  Pain Score 0  MEWS Score  MEWS Temp 0  MEWS Systolic 0  MEWS Pulse 1  MEWS RR 0  MEWS LOC 0  MEWS Score 1  Provider Notification  Provider Name/Title Samara Deist DO  Date Provider Notified 05/27/23  Provider response No new orders  Date of Provider Response 05/27/23  Note  Patient Observations Irritable

## 2023-05-27 NOTE — ED Provider Notes (Signed)
[place] EMERGENCY DEPARTMENT AT [facility] Provider Note   CSN: [id number] Arrival date & time: 05/26/23  2019     History  Chief Complaint  Patient presents with   Constipation    Norma Ramsey is a 37 y.o. female.  Patient presents to the emergency department complaining of constipation.  Patient was admitted to the hospital from July 24 through July 27 due to vomiting and diarrhea with electrolyte abnormalities including hypocalcemia, hypomagnesemia, and hypokalemia.  She has a history of daily drinking and marijuana use.  While admitted she was given medicine to "help stop diarrhea" and states she has had no significant bowel movement since that time.  She has tried MiraLAX, Senokot, stool softeners, saline enemas, warm water enemas, and after her most recent emergency department visit for the same complaint was prescribed GoLytely which also provided no relief.  She was seen at the family medicine clinic on August 22 for drug-induced constipation and a disimpaction was attempted.  Notes show that they were able to touch a stool ball but not able to reach to remove it.  It was recommended she come to the emergency department for "large-volume enema".  Plan was for admission to the family medicine service if of enema was unsuccessful.  Patient complains of generalized abdominal discomfort due to lack of bowel movements.  She denies nausea, vomiting, chest pain, shortness of breath, urinary symptoms, fever, headache.  HPI     Home Medications Prior to Admission medications   Medication Sig Start Date End Date Taking? Authorizing Provider  amLODipine (NORVASC) 10 MG tablet Take 1 tablet (10 mg total) by mouth every evening. 05/01/23   Shelby Mattocks, DO  bisacodyl 5 MG EC tablet Take 1 tablet (5 mg total) by mouth daily as needed for moderate constipation. 05/16/23   Alfredo Martinez, MD  calcium carbonate (OSCAL) 1500 (600 Ca) MG TABS tablet Take 1 tablet (1,500 mg total) by  mouth 2 (two) times daily with a meal. 04/29/23   Alicia Amel, MD  capsaicin (ZOSTRIX) 0.025 % cream Apply topically 2 (two) times daily. 04/29/23   Alicia Amel, MD  cyanocobalamin (VITAMIN B12) 1000 MCG tablet Take 1 tablet (1,000 mcg total) by mouth daily. 04/29/23   Alicia Amel, MD  Dulaglutide (TRULICITY) 0.75 MG/0.5ML SOPN Inject 0.75 mg weekly 01/20/23   Westley Chandler, MD  folic acid (FOLVITE) 1 MG tablet Take 1 tablet (1 mg total) by mouth daily. 04/30/23   Alicia Amel, MD  magnesium oxide (MAG-OX) 400 (240 Mg) MG tablet Take 1 tablet (400 mg total) by mouth daily. 04/29/23   Alicia Amel, MD  olmesartan (BENICAR) 40 MG tablet Take 1 tablet (40 mg total) by mouth daily. 04/29/23   Alicia Amel, MD  polyethylene glycol (GOLYTELY) 236 g solution Drink 200 ml, wait 10 min, drink 200 ml, wait 10 min, and repeat until bowel movement. 05/20/23   Edwin Dada P, DO  polyethylene glycol powder (MIRALAX) 17 GM/SCOOP powder Mix 1 capful of miralax in 8 ounces of water twice daily for constipation 05/10/23   Westley Chandler, MD  potassium chloride SA (KLOR-CON M) 20 MEQ tablet Take 2 tablets (40 mEq total) by mouth daily. 04/29/23   Alicia Amel, MD  senna (SENOKOT) 8.6 MG TABS tablet Take 1 tablet (8.6 mg total) by mouth 2 (two) times daily as needed for mild constipation. 05/10/23   Westley Chandler, MD  spironolactone (ALDACTONE) 25 MG tablet  Take 1 tablet (25 mg total) by mouth daily. 04/30/23   Alicia Amel, MD  Vitamin D, Ergocalciferol, (DRISDOL) 1.25 MG (50000 UNIT) CAPS capsule Take 1 capsule (50,000 Units total) by mouth every 7 (seven) days. 05/04/23   Alicia Amel, MD      Allergies    Patient has no known allergies.    Review of Systems   Review of Systems  Physical Exam Updated Vital Signs BP (!) 147/137   Pulse 89   Temp 98.7 F (37.1 C) (Oral)   Resp 18   SpO2 99%  Physical Exam Vitals and nursing note reviewed.  Constitutional:      General: She  is not in acute distress.    Appearance: She is well-developed. She is obese.  HENT:     Head: Normocephalic and atraumatic.  Eyes:     Conjunctiva/sclera: Conjunctivae normal.  Cardiovascular:     Rate and Rhythm: Normal rate and regular rhythm.     Heart sounds: No murmur heard. Pulmonary:     Effort: Pulmonary effort is normal. No respiratory distress.     Breath sounds: Normal breath sounds.  Abdominal:     Palpations: Abdomen is soft.     Tenderness: There is abdominal tenderness.  Genitourinary:    Comments: Deferred Musculoskeletal:        General: No swelling.     Cervical back: Neck supple.  Skin:    General: Skin is warm and dry.     Capillary Refill: Capillary refill takes less than 2 seconds.  Neurological:     Mental Status: She is alert.  Psychiatric:        Mood and Affect: Mood normal.     ED Results / Procedures / Treatments   Labs (all labs ordered are listed, but only abnormal results are displayed) Labs Reviewed  CBC WITH DIFFERENTIAL/PLATELET - Abnormal; Notable for the following components:      Result Value   WBC 16.5 (*)    MCV 105.2 (*)    MCH 37.4 (*)    Platelets 416 (*)    Neutro Abs 12.3 (*)    All other components within normal limits  COMPREHENSIVE METABOLIC PANEL - Abnormal; Notable for the following components:   Potassium 3.2 (*)    CO2 18 (*)    Glucose, Bld 167 (*)    BUN 5 (*)    Anion gap 16 (*)    All other components within normal limits  URINALYSIS, ROUTINE W REFLEX MICROSCOPIC - Abnormal; Notable for the following components:   Color, Urine STRAW (*)    APPearance HAZY (*)    Specific Gravity, Urine 1.002 (*)    Hgb urine dipstick SMALL (*)    Bacteria, UA FEW (*)    All other components within normal limits  HCG, SERUM, QUALITATIVE    EKG None  Radiology No results found.  Procedures Procedures    Medications Ordered in ED Medications  amLODipine (NORVASC) tablet 10 mg (10 mg Oral Given 05/27/23 0400)   ketorolac (TORADOL) 30 MG/ML injection 15 mg (15 mg Intravenous Given 05/27/23 0400)    ED Course/ Medical Decision Making/ A&P                                 Medical Decision Making Amount and/or Complexity of Data Reviewed Labs: ordered.  Risk Prescription drug management. Decision regarding hospitalization.   This patient presents to  the ED for concern of constipation, this involves an extensive number of treatment options, and is a complaint that carries with it a high risk of complications and morbidity.  The differential diagnosis includes drug-induced constipation, slow transit constipation, IBS, others   Co morbidities that complicate the patient evaluation  Obesity, hypertension, type II DM   Additional history obtained:   External records from outside source obtained and reviewed including family medicine notes from August 22 detailing plan for emergency department enema, admission if no relief   Lab Tests:  I Ordered, and personally interpreted labs.  The pertinent results include: WBC 16.5, small hemoglobin on urine dipstick, negative pregnancy test   Consultations Obtained:  I requested consultation with the family medicine team, Dr.Everhart and discussed lab and imaging findings as well as pertinent plan - they recommend: admission   Problem List / ED Course / Critical interventions / Medication management   I ordered medication including amlodipine and a soapsuds enema for hypertension and constipation Reevaluation of the patient after these medicines showed that the patient stayed the same I have reviewed the patients home medicines and have made adjustments as needed   Test / Admission - Considered:  Patient with no relief after soapsuds enema.  Very small amount of soft stool after enema but no large amount of stool, no relief of patient's discomfort.  Based on family medicine notes from yesterday we will plan on admission to family medicine  residency team for further management.         Final Clinical Impression(s) / ED Diagnoses Final diagnoses:  Fecal impaction in rectum (HCC)  Constipation, unspecified constipation type    Rx / DC Orders ED Discharge Orders     None         Pamala Duffel 05/27/23 0411    Sabas Sous, MD 05/27/23 [phone]

## 2023-05-27 NOTE — Progress Notes (Signed)
Patient has a BP of 191/112 MAP 134. HR 119. Patient states she is in pain from her rectum but is asymptomatic from elevated BP. MD made aware of elevated BP. No new orders at this time, will continue to monitor.

## 2023-05-28 ENCOUNTER — Observation Stay (HOSPITAL_COMMUNITY): Payer: 59

## 2023-05-28 DIAGNOSIS — K5641 Fecal impaction: Secondary | ICD-10-CM | POA: Diagnosis not present

## 2023-05-28 LAB — CBC
HCT: 38.7 % (ref 36.0–46.0)
Hemoglobin: 13.5 g/dL (ref 12.0–15.0)
MCH: 35.6 pg — ABNORMAL HIGH (ref 26.0–34.0)
MCHC: 34.9 g/dL (ref 30.0–36.0)
MCV: 102.1 fL — ABNORMAL HIGH (ref 80.0–100.0)
Platelets: 344 10*3/uL (ref 150–400)
RBC: 3.79 MIL/uL — ABNORMAL LOW (ref 3.87–5.11)
RDW: 12.5 % (ref 11.5–15.5)
WBC: 11.8 10*3/uL — ABNORMAL HIGH (ref 4.0–10.5)
nRBC: 0 % (ref 0.0–0.2)

## 2023-05-28 LAB — BASIC METABOLIC PANEL
Anion gap: 10 (ref 5–15)
BUN: 7 mg/dL (ref 6–20)
CO2: 23 mmol/L (ref 22–32)
Calcium: 8.9 mg/dL (ref 8.9–10.3)
Chloride: 101 mmol/L (ref 98–111)
Creatinine, Ser: 0.64 mg/dL (ref 0.44–1.00)
GFR, Estimated: >60 mL/min (ref >60)
Glucose, Bld: 123 mg/dL — ABNORMAL HIGH (ref 70–99)
Potassium: 3.5 mmol/L (ref 3.5–5.1)
Sodium: 134 mmol/L — ABNORMAL LOW (ref 135–145)

## 2023-05-28 LAB — MAGNESIUM: Magnesium: 1.3 mg/dL — ABNORMAL LOW (ref 1.7–2.4)

## 2023-05-28 MED ORDER — IBUPROFEN 200 MG PO TABS: 600.0000 mg | ORAL_TABLET | Freq: Four times a day (QID) | ORAL | Status: DC | PRN

## 2023-05-28 MED ORDER — MELATONIN 3 MG PO TABS: 3.0000 mg | ORAL_TABLET | Freq: Every day | ORAL | Status: DC

## 2023-05-28 MED ORDER — LIDOCAINE HCL URETHRAL/MUCOSAL 2 % EX GEL
1.0000 | CUTANEOUS | Status: DC | PRN
  Administered 2023-05-28 – 2023-05-29 (×2): 1 via TOPICAL
  Filled 2023-05-28 (×3): qty 6

## 2023-05-28 MED ORDER — MELATONIN 3 MG PO TABS: 3.0000 mg | ORAL_TABLET | Freq: Once | ORAL | Status: DC

## 2023-05-28 MED ORDER — KETOROLAC TROMETHAMINE 30 MG/ML IJ SOLN
30.0000 mg | Freq: Once | INTRAMUSCULAR | Status: AC
  Administered 2023-05-28: 30 mg via INTRAVENOUS
  Filled 2023-05-28: qty 1

## 2023-05-28 MED ORDER — GERHARDT'S BUTT CREAM
TOPICAL_CREAM | CUTANEOUS | Status: DC | PRN
  Filled 2023-05-28: qty 1

## 2023-05-28 MED ORDER — HYDRALAZINE HCL 20 MG/ML IJ SOLN
2.0000 mg | Freq: Once | INTRAMUSCULAR | Status: AC
  Administered 2023-05-28: 2 mg via INTRAVENOUS
  Filled 2023-05-28: qty 1

## 2023-05-28 MED ORDER — SORBITOL 70 % SOLN
960.0000 mL | TOPICAL_OIL | Freq: Once | ORAL | Status: AC
  Administered 2023-05-28: 960 mL via RECTAL
  Filled 2023-05-28: qty 240

## 2023-05-28 MED ORDER — HYDRALAZINE HCL 20 MG/ML IJ SOLN
2.0000 mg | Freq: Once | INTRAMUSCULAR | Status: DC
  Filled 2023-05-28: qty 1

## 2023-05-28 MED ORDER — MAGNESIUM SULFATE 2 GM/50ML IV SOLN
2.0000 g | Freq: Once | INTRAVENOUS | Status: AC
  Administered 2023-05-28: 2 g via INTRAVENOUS
  Filled 2023-05-28: qty 50

## 2023-05-28 NOTE — Progress Notes (Signed)
   05/28/23 1359  TOC Brief Assessment  Insurance and Status Reviewed  Patient has primary care physician Yes  Home environment has been reviewed home  Prior level of function: independent  Prior/Current Home Services No current home services  Social Determinants of Health Reivew SDOH reviewed no interventions necessary  Readmission risk has been reviewed Yes  Transition of care needs no transition of care needs at this time

## 2023-05-28 NOTE — Progress Notes (Signed)
Went to see patient after attempted SMOG enema. Pt reports burning on buttocks and vaginal area as well as internally. Pt very frustrated with repeated enemas causing pain and not successfully clearing out stool. Listened and empathized, will give some time before trying another intervention. Can also consider additional PRN medication prior to next enema.

## 2023-05-28 NOTE — Progress Notes (Signed)
Spoke to patient about trying another enema since she was not able to tolerate SMOG. Pt refusing, stating she feels worse than she did before she came to the hospital. Patient states she should just go home. Emphasized to patient that her condition may worsen if she goes home and encouraged her to allow [place] more time to treat her. Will take a break from enemas and reevaluate in the morning. May consult GI again if pt continues to refuse enemas.

## 2023-05-28 NOTE — Assessment & Plan Note (Signed)
Noted to have K 3.2 on initial CMP. Notably recently admitted with hypoCa, hypo K, and hypoMg. Thought to potentially be due to EtOH use vs malabsorptive disorder. K improved to 3.5 this AM. - am BMP and Mg, replete lytes as indicated

## 2023-05-28 NOTE — Progress Notes (Signed)
Daily Progress Note Intern Pager: [phone]  Patient name: Norma Ramsey Medical record number: [id number] Date of birth: 16-Jul-1986 Age: 37 y.o. Gender: female  Primary Care Provider: Westley Chandler, MD Consultants: none Code Status: full code  Pt Overview and Major Events to Date:  8/24: admitted with fecal impaction  Assessment and Plan: Norma Ramsey is a 37 y.o. female presenting with constipation x1 month and known stool ball in rectum. Differential for presentation of this includes drug induced, electrolyte disturbance, colonic stricture, stercoral colitis. I suspect this could be due to pt's course of loperamide after d/c from the hospital in addition to the twice daily calcium supplement. Low suspicion for mechanical colonic cause with pt's normal CT A/P 1 week ago.  Low suspicion for stercoral colitis given improving leukocytosis (16.2> 11.8), afebrile and non-tender, non-distended on exam. Blood pressures have been elevated, likely related to discomfort exacerbating underlying hypertension.  Assessment & Plan Fecal impaction in rectum (HCC) Constipation x1 month. Stool ball palpated at [facility] 8/23, advised to come to ED for large volume enema and potential admission. Soaps suds enema in the ED with minimal yield. Last CT A/P showed large stool volume in colon. S/p milk and molasses enema 8/24. Took a break from laxatives overnight. Leukocytosis downtrending 16.5>11.8 - med-surg, observation status - manual disimpaction today - GI recommendations  -should be fully disimpacted rectally before giving GoLytely.  -SMOG enema x 1, watching for results and repeat rectal exams to manually disimpact as needed, then warm tapwater enemas thereafter continued until impaction is relieved.  -Then can give GoLytely -Order abdominal x-ray now and repeat tomorrow to check on progress.  -f/u chronic constipation in GI clinic, nurse will call to schedule Essential hypertension Pt with  years long history of HTN, most recent outpatient readings have been 130s/80s. Elevated BP readings overnight- 170s/120s. Had home BP meds yesterday AM. -continue home amlodipine 10mg  daily, irbesartan 300mg  daily (home is olmesartan 40), spironolactone 25mg  daily -one time hydralazine 2mg  today -continue to monitor vitals Electrolyte abnormality Noted to have K 3.2 on initial CMP. Notably recently admitted with hypoCa, hypo K, and hypoMg. Thought to potentially be due to EtOH use vs malabsorptive disorder. K improved to 3.5 this AM. - am BMP and Mg, replete lytes as indicated    FEN/GI: regular PPx: SCDs Dispo: pending clinical improvement  Subjective:  Pt states she is having vaginal and anal discomfort/irritation due to frequent liquid bowel movements. Tried drinking GoLytely last night, did not want to continue due to irritation. Denies abdominal pain, nausea, vomiting. States she has been constipated for about 1 month, has not experienced this previously. Discussed that next step would be trying another manual disimpaction and consulting GI. Also discussed patient's hypertension, states she has been on BP meds for years. States pressures were also high during last admission.  Objective: Temp:  [98.1 F (36.7 C)-98.8 F (37.1 C)] 98.1 F (36.7 C) (08/25 0616) Pulse Rate:  [80-119] 80 (08/25 0616) Resp:  [16-20] 16 (08/25 0616) BP: (124-191)/(73-130) 175/121 (08/25 0616) SpO2:  [98 %-100 %] 98 % (08/25 0616) Physical Exam: General: sitting up in bed in NAD Cardiovascular: RRR, normal S1/S2, no murmurs, rubs, or gallops Respiratory: CTAB, normal WOB, no wheezes, rales, rhonchi Abdomen: soft, nontender to palpation, nondistended, decreased bowel sounds Extremities: no swelling to BLE  Laboratory: Most recent CBC Lab Results  Component Value Date   WBC 11.8 (H) 05/28/2023   HGB 13.5 05/28/2023   HCT 38.7  05/28/2023   MCV 102.1 (H) 05/28/2023   PLT 344 05/28/2023   Most  recent BMP    Latest Ref Rng & Units 05/28/2023    4:28 AM  BMP  Glucose 70 - 99 mg/dL 119   BUN 6 - 20 mg/dL 7   Creatinine 1.47 - 8.29 mg/dL 5.62   Sodium 130 - 865 mmol/L 134   Potassium 3.5 - 5.1 mmol/L 3.5   Chloride 98 - 111 mmol/L 101   CO2 22 - 32 mmol/L 23   Calcium 8.9 - 10.3 mg/dL 8.9      Imaging/Diagnostic Tests: none [name], MD 05/28/2023, 7:37 AM  PGY-1, [facility] FPTS Intern pager: [phone], text pages welcome Secure chat group [facility] Teaching Service

## 2023-05-28 NOTE — Assessment & Plan Note (Addendum)
Pt with years long history of HTN, most recent outpatient readings have been 130s/80s. Elevated BP readings overnight- 170s/120s. Had home BP meds yesterday AM. -continue home amlodipine 10mg  daily, irbesartan 300mg  daily (home is olmesartan 40), spironolactone 25mg  daily -one time hydralazine 2mg  today -continue to monitor vitals

## 2023-05-28 NOTE — Progress Notes (Signed)
Seen at bedside with Dr. Sharion Dove. Norma Ramsey expresses her frustration with the lack of progress in treating her fecal impaction. She reports an ongoing sense of rectal fullness and tenesmus that is non-productive of anything but small volume liquid stool.  She notes that she has had three enemas in the hospital after trying three at home. We have also attempted manual disimpaction x2 (once in clinic and once here) both of which have been unsuccessful. She feels her pain is not being addressed and that we are not doing anything in the hospital that she could not be doing at home. "Do I just need to go home until something ruptures and then yall will do something?" I explained the stepwise approach to treatment and our reasoning for not wanting to jump to anything too invasive too quickly. I do suspect that she is reaching the point where she may need a more invasive intervention such as stool ball fragmentation endoscopically.  - Toradol x1 tonight for pain and to help her get some rest - Reassess in the morning, suspect getting GI involved is the next best step   Norma Mccoy, MD 05/28/23 10:07 PM

## 2023-05-28 NOTE — Progress Notes (Signed)
No bowel movement last night,

## 2023-05-28 NOTE — Progress Notes (Signed)
Attempted manual disimpaction with chaperone present.  Nursing had applied lidocaine gel to patient's bottom prior to this.  Was able to palpate hard stool ball with fingertip, however was unable to extract any stool.  Will try SMOG enema next per GI recommendations and repeat manual disimpaction as needed.

## 2023-05-28 NOTE — Assessment & Plan Note (Addendum)
Constipation x1 month. Stool ball palpated at [facility] 8/23, advised to come to ED for large volume enema and potential admission. Soaps suds enema in the ED with minimal yield. Last CT A/P showed large stool volume in colon. S/p milk and molasses enema 8/24. Took a break from laxatives overnight. Leukocytosis downtrending 16.5>11.8 - med-surg, observation status - manual disimpaction today - GI recommendations  -should be fully disimpacted rectally before giving GoLytely.  -SMOG enema x 1, watching for results and repeat rectal exams to manually disimpact as needed, then warm tapwater enemas thereafter continued until impaction is relieved.  -Then can give GoLytely -Order abdominal x-ray now and repeat tomorrow to check on progress.  -f/u chronic constipation in GI clinic, nurse will call to schedule

## 2023-05-28 NOTE — Progress Notes (Signed)
MD made AWARE. No new orders.   05/28/23 0616  Vitals  Temp 98.1 F (36.7 C)  BP (!) 175/121  MAP (mmHg) 137  BP Location Right Wrist  BP Method Automatic  Patient Position (if appropriate) Lying  Pulse Rate 80  Pulse Rate Source Monitor  Resp 16  MEWS COLOR  MEWS Score Color Green  Oxygen Therapy  SpO2 98 %  O2 Device Room Air  MEWS Score  MEWS Temp 0  MEWS Systolic 0  MEWS Pulse 0  MEWS RR 0  MEWS LOC 0  MEWS Score 0

## 2023-05-28 NOTE — Plan of Care (Signed)
  Problem: Clinical Measurements: Goal: Ability to maintain clinical measurements within normal limits will improve Outcome: Progressing Goal: Will remain free from infection Outcome: Progressing Goal: Respiratory complications will improve Outcome: Progressing   Problem: Nutrition: Goal: Adequate nutrition will be maintained Outcome: Progressing   Problem: Safety: Goal: Ability to remain free from injury will improve Outcome: Progressing   Problem: Skin Integrity: Goal: Risk for impaired skin integrity will decrease Outcome: Progressing

## 2023-05-29 ENCOUNTER — Telehealth: Payer: Self-pay

## 2023-05-29 ENCOUNTER — Other Ambulatory Visit (HOSPITAL_COMMUNITY): Payer: Self-pay

## 2023-05-29 DIAGNOSIS — K5641 Fecal impaction: Secondary | ICD-10-CM | POA: Diagnosis not present

## 2023-05-29 LAB — BASIC METABOLIC PANEL
Anion gap: 11 (ref 5–15)
BUN: 11 mg/dL (ref 6–20)
CO2: 21 mmol/L — ABNORMAL LOW (ref 22–32)
Calcium: 8.6 mg/dL — ABNORMAL LOW (ref 8.9–10.3)
Chloride: 102 mmol/L (ref 98–111)
Creatinine, Ser: 0.54 mg/dL (ref 0.44–1.00)
GFR, Estimated: >60 mL/min (ref >60)
Glucose, Bld: 133 mg/dL — ABNORMAL HIGH (ref 70–99)
Potassium: 3.7 mmol/L (ref 3.5–5.1)
Sodium: 134 mmol/L — ABNORMAL LOW (ref 135–145)

## 2023-05-29 LAB — MAGNESIUM: Magnesium: 1.6 mg/dL — ABNORMAL LOW (ref 1.7–2.4)

## 2023-05-29 MED ORDER — POLYETHYLENE GLYCOL 3350 17 GM/SCOOP PO POWD
1.0000 | Freq: Once | ORAL | 0 refills | Status: AC
Start: 2023-05-29 — End: 2023-05-30
  Filled 2023-05-29: qty 476, 1d supply, fill #0

## 2023-05-29 MED ORDER — POLYETHYLENE GLYCOL 3350 17 GM/SCOOP PO POWD
17.0000 g | Freq: Two times a day (BID) | ORAL | 0 refills | Status: AC
  Filled 2023-05-29: qty 476, 14d supply, fill #0

## 2023-05-29 MED ORDER — GERHARDT'S BUTT CREAM: 1.0000 | TOPICAL_CREAM | Freq: Every day | CUTANEOUS | Status: AC

## 2023-05-29 MED ORDER — MAGNESIUM SULFATE IN D5W 1-5 GM/100ML-% IV SOLN: 1.0000 g | Freq: Once | INTRAVENOUS | Status: DC

## 2023-05-29 MED ORDER — MAGNESIUM SULFATE 2 GM/50ML IV SOLN
2.0000 g | Freq: Once | INTRAVENOUS | Status: AC
  Administered 2023-05-29: 2 g via INTRAVENOUS
  Filled 2023-05-29: qty 50

## 2023-05-29 MED ORDER — LIDOCAINE HCL URETHRAL/MUCOSAL 2 % EX GEL
1.0000 | CUTANEOUS | 0 refills | Status: AC | PRN
  Filled 2023-05-29: qty 30, fill #0

## 2023-05-29 NOTE — Assessment & Plan Note (Signed)
Prior admission with hypoCa, hypo K, and hypoMg. Thought to potentially be due to EtOH use vs malabsorptive disorder. HypoK on this admission has resolved.  - am BMP and Mg, replete lytes as indicated  -holding home Ca supplementation as this can cause constipation

## 2023-05-29 NOTE — Telephone Encounter (Signed)
-----   Message from Unk Lightning sent at 05/28/2023 10:12 AM EDT ----- Regarding: Needs follow-up in clinic I believe Dr. Tomasa Rand and [facility] know this patient from prior hospitalization, she will need follow-up in clinic in the next 3 to 4 weeks with either of them to discuss recent obstipation/fecal impaction and possible colonoscopy.  Thanks, JL L

## 2023-05-29 NOTE — Progress Notes (Signed)
Daily Progress Note Intern Pager: [phone]  Patient name: Norma Ramsey Medical record number: [id number] Date of birth: [birth year] Age: 37 y.o. Gender: female  Primary Care Provider: Westley Chandler, MD Consultants: GI Code Status: full code  Assessment and Plan: Norma Ramsey is a 37 y.o. female presenting with constipation x1 month and known stool ball in rectum. Differential for presentation of this includes drug induced, electrolyte disturbance, colonic stricture, stercoral colitis. I suspect this could be due to pt's course of loperamide after d/c from the hospital in addition to the twice daily calcium supplement. Low suspicion for mechanical colonic cause with pt's normal CT A/P 1 week ago.  Low suspicion for stercoral colitis given improving leukocytosis (16.2> 11.8), afebrile and non-tender, non-distended on exam. Blood pressures have been elevated, likely related to discomfort exacerbating underlying hypertension.   Assessment & Plan Fecal impaction in rectum (HCC) Constipation x1 month. Stool ball palpated at [facility] 8/23, advised to come to ED for large volume enema and potential admission. Soaps suds enema in the ED with minimal yield. Last CT A/P 8/17 showed large stool volume in colon. S/p milk and molasses enema 8/24, attempted manual disimpaction and SMOG enema 8/25. Pt refusing further enemas.  - attempt manual disimpaction again - offer smaller volume enema -mobilize 3 times daily - prior GI recommendations; consider reconsulting if above are ineffective/not tolerated  -should be fully disimpacted rectally before giving GoLytely.  -SMOG enema x 1, watching for results and repeat rectal exams to manually disimpact as needed, then warm tapwater enemas thereafter continued until impaction is relieved.  -Then can give GoLytely -Order abdominal x-ray now and repeat tomorrow to check on progress.  -f/u chronic constipation in GI clinic, nurse will call to  schedule  Essential hypertension Pt with years long history of HTN, most recent outpatient readings have been 130s/80s. Elevated BP readings yesterday to 170s/120s, gave one time hydralazine 2g -continue home amlodipine 10mg  daily, irbesartan 300mg  daily (home is olmesartan 40), spironolactone 25mg  daily -continue to monitor vitals Electrolyte abnormality Prior admission with hypoCa, hypo K, and hypoMg. Thought to potentially be due to EtOH use vs malabsorptive disorder. HypoK on this admission has resolved.  - am BMP and Mg, replete lytes as indicated  -holding home Ca supplementation as this can cause constipation    FEN/GI: regular PPx: SCDs Dispo:pending clinical improvement  Subjective:  Patient continues to report frustration and intolerance to enemas this morning.  Refusing further enemas, states it took several hours for her to recover from the last one and that she almost left the hospital last night.  Discussed that unfortunately enemas may be the best option at this point, patient continues to refuse.  Endorses occasional sharp RUQ pain that started last night and improved with pain medication. Encouraged patient to walk around the unit to help move bowels.  Objective: Temp:  [98 F (36.7 C)-98.1 F (36.7 C)] 98 F (36.7 C) (08/26 0412) Pulse Rate:  [85-100] 91 (08/26 0412) Resp:  [18-19] 18 (08/26 0412) BP: (138-154)/(99-108) 140/99 (08/26 0412) SpO2:  [98 %-99 %] 98 % (08/26 0412) Physical Exam: General: Sitting up in bed in NAD Cardiovascular: RRR, normal S1/S2, no murmurs, rubs, gallops Respiratory: CTAB, normal WOB Abdomen: decreased bowel sounds, soft, mild tenderness to deep palpation of RUQ, nondistended Extremities: no BLE edema  Laboratory: Most recent CBC Lab Results  Component Value Date   WBC 11.8 (H) 05/28/2023   HGB 13.5 05/28/2023   HCT 38.7 05/28/2023  MCV 102.1 (H) 05/28/2023   PLT 344 05/28/2023   Most recent BMP    Latest Ref Rng & Units  05/29/2023    4:33 AM  BMP  Glucose 70 - 99 mg/dL 010   BUN 6 - 20 mg/dL 11   Creatinine 2.72 - 1.00 mg/dL 5.36   Sodium 644 - 034 mmol/L 134   Potassium 3.5 - 5.1 mmol/L 3.7   Chloride 98 - 111 mmol/L 102   CO2 22 - 32 mmol/L 21   Calcium 8.9 - 10.3 mg/dL 8.6     Imaging/Diagnostic Tests: none [name], MD 05/29/2023, 8:09 AM  PGY-1, [facility] FPTS Intern pager: [phone], text pages welcome Secure chat group [facility] Teaching Service

## 2023-05-29 NOTE — Assessment & Plan Note (Addendum)
Constipation x1 month. Stool ball palpated at [facility] 8/23, advised to come to ED for large volume enema and potential admission. Soaps suds enema in the ED with minimal yield. Last CT A/P 8/17 showed large stool volume in colon. S/p milk and molasses enema 8/24, attempted manual disimpaction and SMOG enema 8/25. Pt refusing further enemas.  - attempt manual disimpaction again - offer smaller volume enema -mobilize 3 times daily - prior GI recommendations; consider reconsulting if above are ineffective/not tolerated  -should be fully disimpacted rectally before giving GoLytely.  -SMOG enema x 1, watching for results and repeat rectal exams to manually disimpact as needed, then warm tapwater enemas thereafter continued until impaction is relieved.  -Then can give GoLytely -Order abdominal x-ray now and repeat tomorrow to check on progress.  -f/u chronic constipation in GI clinic, nurse will call to schedule

## 2023-05-29 NOTE — Discharge Planning (Signed)
Patient alert and oriented. IV access removed. Discharge teaching provided. Patient transported to lobby for private transportation home.

## 2023-05-29 NOTE — Telephone Encounter (Signed)
Patient previously scheduled an appt on 07/25/23 at 3:20 pm. Will keep as scheduled since there aren't any appts within the requested timeframe.

## 2023-05-29 NOTE — Discharge Summary (Signed)
Family Medicine Teaching [facility] Discharge Summary  Patient name: Norma Ramsey Medical record number: [id number] Date of birth: [birth year] Age: 37 y.o. Gender: female Date of Admission: 05/26/2023  Date of Discharge: 05/29/23 Admitting Physician: Para March, DO  Primary Care Provider: Westley Chandler, MD Consultants: None  Indication for Hospitalization: Constipation  Discharge Diagnoses/Problem List:  Principal Problem for Admission: Fecal impaction Other Problems addressed during stay:  Principal Problem:   Fecal impaction in rectum [facility]) Active Problems:   Essential hypertension   Electrolyte abnormality   Abdominal pain  Brief Hospital Course:  Norma Ramsey is a 37 y.o.female with a history of HTN, T2DM, who was admitted to the [facility] Teaching Service at [facility] for constipation. Her hospital course is detailed below:  Constipation  Fecal Impaction Presented to the ED after being seen in clinic for constipation x 1 month suspected to be related to drug-induced causes by receiving loperamide during hospital admission 1 month prior. She was unsuccessful with stool softeners, warm water enema, saline enema.  Milk and molasses enema during hospitalization was unsuccessful.  She took some GoLytely bowel prep without success. SMOG enema per GI recs, this resulted in some movement of stool that was able to be manually removed the following day. Calcium supplementation was discontinued due to constipation side effect.  Electrolytes were repleted as necessary.  Hypertension Pt with elevated pressures up to 180s/120s on home spiro 25mg , amlodipine 10mg , and irbesartan 300mg  (takes olmesartan 40mg  at home).  This persisted throughout hospitalization, suspect to be worsened related to fecal impaction discomfort.  Discharged with BP 131/92 after having successful bowel movements.  Other chronic conditions were medically managed with home medications and formulary  alternatives as necessary (HTN, T2DM, vitamin deficiency)  PCP Follow-up Recommendations: GI outpatient f/u for chronic constipation Hypertension medication management BMP, mag for electrolyte abnormalities.  Disposition: Home  Discharge Condition: Stable  Discharge Exam:  Vitals:   05/29/23 0904 05/29/23 1452  BP: (!) 172/102 (!) 131/92  Pulse: 95 96  Resp: 18 17  Temp: 98 F (36.7 C) 98 F (36.7 C)  SpO2: 100% 100%  General: Sitting up in bed in NAD Cardiovascular: RRR, normal S1/S2, no murmurs, rubs, gallops Respiratory: CTAB, normal WOB Abdomen: decreased bowel sounds, soft, mild tenderness to deep palpation of RUQ, nondistended Extremities: no BLE edema  Significant Procedures: Various enemas (tap water, milk and molasses, Fleet, SMOG, GoLytely bowel prep)  Significant Labs and Imaging:  Recent Labs  Lab 05/28/23 0428  WBC 11.8*  HGB 13.5  HCT 38.7  PLT 344   Recent Labs  Lab 05/28/23 0428 05/29/23 0433  NA 134* 134*  K 3.5 3.7  CL 101 102  CO2 23 21*  GLUCOSE 123* 133*  BUN 7 11  CREATININE 0.64 0.54  CALCIUM 8.9 8.6*  MG 1.3* 1.6*   Results/Tests Pending at Time of Discharge: None  Discharge Medications:  Allergies as of 05/29/2023       Reactions   Loperamide Other (See Comments)   Severe constipation x 4+ weeks requiring hospital admission        Medication List     STOP taking these medications    bisacodyl 5 MG EC tablet Commonly known as: bisacodyl   calcium carbonate 1500 (600 Ca) MG Tabs tablet Commonly known as: OSCAL   capsaicin 0.025 % cream Commonly known as: ZOSTRIX   potassium chloride SA 20 MEQ tablet Commonly known as: KLOR-CON M   UNKNOWN TO PATIENT  TAKE these medications    amLODipine 10 MG tablet Commonly known as: NORVASC Take 1 tablet (10 mg total) by mouth every evening.   cyanocobalamin 1000 MCG tablet Commonly known as: VITAMIN B12 Take 1 tablet (1,000 mcg total) by mouth daily.   folic  acid 1 MG tablet Commonly known as: FOLVITE Take 1 tablet (1 mg total) by mouth daily.   GaviLyte-G 236 g solution Generic drug: polyethylene glycol Drink 200 ml, wait 10 min, drink 200 ml, wait 10 min, and repeat until bowel movement.   Gerhardt's butt cream Crea Apply 1 Application topically daily.   lidocaine 2 % jelly Commonly known as: XYLOCAINE Apply 1 Application topically as needed (for use during manual disimpaction).   magnesium oxide 400 (240 Mg) MG tablet Commonly known as: MAG-OX Take 1 tablet (400 mg total) by mouth daily.   olmesartan 40 MG tablet Commonly known as: BENICAR Take 1 tablet (40 mg total) by mouth daily.   polyethylene glycol powder 17 GM/SCOOP powder Commonly known as: GLYCOLAX/MIRALAX Take 255 g by mouth once for 1 dose. What changed:  how much to take how to take this when to take this additional instructions   polyethylene glycol 17 g packet Commonly known as: MIRALAX / GLYCOLAX Take 17 g by mouth 2 (two) times daily. What changed: You were already taking a medication with the same name, and this prescription was added. Make sure you understand how and when to take each.   senna 8.6 MG Tabs tablet Commonly known as: SENOKOT Take 1 tablet (8.6 mg total) by mouth 2 (two) times daily as needed for mild constipation.   spironolactone 25 MG tablet Commonly known as: ALDACTONE Take 1 tablet (25 mg total) by mouth daily.   Vitamin D (Ergocalciferol) 1.25 MG (50000 UNIT) Caps capsule Commonly known as: DRISDOL Take 1 capsule (50,000 Units total) by mouth every 7 (seven) days.       Discharge Instructions: Please refer to Patient Instructions section of EMR for full details.  Patient was counseled important signs and symptoms that should prompt return to medical care, changes in medications, dietary instructions, activity restrictions, and follow up appointments.   Follow-Up Appointments: PCP: Dr. Terisa Starr on 06/08/23 GI: Dr. Tiajuana Amass on 07/25/23  Shelby Mattocks, DO 05/29/2023, 4:45 PM PGY-3, [facility]

## 2023-05-29 NOTE — Progress Notes (Signed)
Evaluated patient.  He has had 2 large bowel movements.  States she feels significantly better now.

## 2023-05-29 NOTE — Progress Notes (Signed)
Spoke with patient about attempting manual disimpaction again today. Patient gave consent for this. Nursing applied lidocaine gel and Gerhardt's butt cream to patient's bottom. Manually removed small quantity of soft, brown stool with nursing staff present. Pt tolerated procedure well. Pt got PRN tylenol and additional lidocaine gel to area immediately afterwards.

## 2023-05-29 NOTE — Discharge Instructions (Signed)
Dear Lorriane Shire,   Thank you for letting us participate in your care! In this section, you will find a brief hospital admission summary of why you were admitted to the hospital, what happened during your admission, your diagnosis/diagnoses, and recommended follow up.  Primary diagnosis: Constipation Treatment plan: He received several enemas and ultimately was able to have 2 bowel movements.  Please continue to take the bowel prep and then MiraLAX twice a day until you see GI so that you are able to have regular bowel movements.  POST-HOSPITAL & CARE INSTRUCTIONS We recommend following up with your PCP within 1 week from being discharged from the hospital. Please let PCP/Specialists know of any changes in medications that were made which you will be able to see in the medications section of this packet.  DOCTOR'S APPOINTMENTS & FOLLOW UP Future Appointments  Date Time Provider Department Center  07/25/2023  3:20 PM Jenel Lucks, MD [facility]   Thank you for choosing [facility]! Take care and be well!  Family Medicine Teaching Service Inpatient Team [place]  [facility]  [address] [place], [place] [address] [phone]

## 2023-05-29 NOTE — Assessment & Plan Note (Signed)
Pt with years long history of HTN, most recent outpatient readings have been 130s/80s. Elevated BP readings yesterday to 170s/120s, gave one time hydralazine 2g -continue home amlodipine 10mg  daily, irbesartan 300mg  daily (home is olmesartan 40), spironolactone 25mg  daily -continue to monitor vitals

## 2023-06-06 ENCOUNTER — Encounter: Payer: Self-pay | Admitting: Pharmacist

## 2023-06-06 NOTE — Progress Notes (Signed)
Erroneous encounter.   Terisa Starr, MD  Family Medicine Teaching Service

## 2023-06-08 ENCOUNTER — Encounter (INDEPENDENT_AMBULATORY_CARE_PROVIDER_SITE_OTHER): Payer: 59 | Admitting: Family Medicine

## 2023-06-08 DIAGNOSIS — I10 Essential (primary) hypertension: Secondary | ICD-10-CM

## 2023-07-10 ENCOUNTER — Telehealth: Payer: Self-pay | Admitting: *Deleted

## 2023-07-10 NOTE — Telephone Encounter (Signed)
-----   Message from Nurse Alona Bene B sent at 05/09/2023 11:37 AM EDT ----- Remind patient of lab draw for cbc, cmp,IgG, GGT and ant-smooth muscle antibody.

## 2023-07-10 NOTE — Telephone Encounter (Signed)
Called patient to remind of her lab draws that are due. Patient states she will be available this week. Location and hours of operation given.

## 2023-07-11 ENCOUNTER — Other Ambulatory Visit: Payer: Self-pay

## 2023-07-11 ENCOUNTER — Other Ambulatory Visit: Payer: Self-pay | Admitting: Family Medicine

## 2023-07-11 DIAGNOSIS — Z1231 Encounter for screening mammogram for malignant neoplasm of breast: Secondary | ICD-10-CM

## 2023-07-15 ENCOUNTER — Inpatient Hospital Stay: Admission: RE | Admit: 2023-07-15 | Payer: 59 | Source: Ambulatory Visit

## 2023-07-25 ENCOUNTER — Ambulatory Visit: Payer: 59 | Admitting: Gastroenterology

## 2023-09-06 NOTE — Progress Notes (Unsigned)
    SUBJECTIVE:   Chief compliant/HPI: annual examination  Norma Ramsey is a 37 y.o. who presents today for an annual exam.   She reports making lots of lifestyle changes. Family is making changes as father has CKD III.   Weight - Patient has been really changing diet  Goal is 180  Changing pastas, incorporating more veggies  Cutting out chips and fries  Eating earlier in day  Last meal 530  She is not yet regularly exercising but increasing activity gradually  Hypertension Taking medications as prescribed. No HA, CP, dyspnea.  She drinks a few shots of hard liquor per day. Think of switching to wine is this may be healthier she thinks.  She smokes cannabis once a day.  She smokes about 3 cigarettes a day.  She is precontemplative about quitting.  Review of systems form notable for none.   Updated history tabs and problem list father also has chronic kidney disease have updated her problem list..   OBJECTIVE:   BP 138/80   Ht 5\' 3"  (1.6 m)   Wt 284 lb 6.4 oz (129 kg)   BMI 50.38 kg/m    HEENT unremarkable  Cardiac: Regular rate and rhythm. Normal S1/S2. No murmurs, rubs, or gallops appreciated. Lungs: Clear bilaterally to ascultation.  Abdomen: Normoactive bowel sounds. No tenderness to deep or light palpation. No rebound or guarding.   Psych: Pleasant and appropriate   ASSESSMENT/PLAN:   Tobacco use Discussed cessation.  Provided quit resources.  B12 deficiency Repeat today she is not taking the medication currently.  Microscopic hematuria Repeat today.  Essential hypertension At goal today.  Continue current medications she is not taking spironolactone will restart pending her labs and blood pressure at follow-up.  Diabetes mellitus without complication (HCC) Urine albumin creatinine ratio today.  She is currently diet controlled with her diabetes.  We discussed adding semaglutide for benefit for weight loss and potential cardiovascular risk as well as her  liver disease.  Information was provided.  This medication does have some benefit for hepatic steatosis as well.  Elevated liver enzymes Repeat today.  She missed her gastroenterology appointment.  She has a history of a slightly elevated anti-smooth muscle antibody.  Biopsy was not recommended at repeat was.  New referral placed she will need to follow-up with gastroenterology.  Alcohol use Discussed and recommended cessation for her liver, diabetes, weight and overall health.  We discussed that wine is in fact likely not healthier or safer.  We also discussed the complete cessation will be recommended.    Annual Examination  See AVS for age appropriate recommendations.  Cervical cancer screening: prior Pap reviewed, repeat due in 2026 Immunizations Flu and covid   Follow up in 1 month     Norma Febres Bartholome Bill, MD [facility]

## 2023-09-07 ENCOUNTER — Encounter: Payer: 59 | Admitting: Family Medicine

## 2023-09-07 ENCOUNTER — Ambulatory Visit: Payer: 59

## 2023-09-07 VITALS — BP 138/80 | Ht 63.0 in | Wt 284.4 lb

## 2023-09-07 DIAGNOSIS — I10 Essential (primary) hypertension: Secondary | ICD-10-CM | POA: Diagnosis not present

## 2023-09-07 DIAGNOSIS — R3129 Other microscopic hematuria: Secondary | ICD-10-CM | POA: Diagnosis not present

## 2023-09-07 DIAGNOSIS — E119 Type 2 diabetes mellitus without complications: Secondary | ICD-10-CM

## 2023-09-07 DIAGNOSIS — Z0000 Encounter for general adult medical examination without abnormal findings: Secondary | ICD-10-CM | POA: Diagnosis not present

## 2023-09-07 DIAGNOSIS — Z23 Encounter for immunization: Secondary | ICD-10-CM

## 2023-09-07 DIAGNOSIS — Z789 Other specified health status: Secondary | ICD-10-CM

## 2023-09-07 DIAGNOSIS — E538 Deficiency of other specified B group vitamins: Secondary | ICD-10-CM

## 2023-09-07 DIAGNOSIS — R748 Abnormal levels of other serum enzymes: Secondary | ICD-10-CM

## 2023-09-07 DIAGNOSIS — Z720 Tobacco use: Secondary | ICD-10-CM

## 2023-09-07 LAB — POCT URINALYSIS DIP (MANUAL ENTRY)
Glucose, UA: NEGATIVE mg/dL
Ketones, POC UA: NEGATIVE mg/dL
Leukocytes, UA: NEGATIVE
Nitrite, UA: NEGATIVE
Protein Ur, POC: 30 mg/dL — AB
Spec Grav, UA: 1.025 (ref 1.010–1.025)
Urobilinogen, UA: 0.2 U/dL
pH, UA: 6 (ref 5.0–8.0)

## 2023-09-07 LAB — POCT UA - MICROSCOPIC ONLY
Epithelial cells, urine per micros: >20
WBC, Ur, HPF, POC: NONE SEEN (ref 0–5)

## 2023-09-07 MED ORDER — VITAMIN B-12 1000 MCG PO TABS
1000.0000 ug | ORAL_TABLET | Freq: Every day | ORAL | 3 refills | Status: AC
Start: 2023-09-07 — End: ?

## 2023-09-07 MED ORDER — AMLODIPINE BESYLATE 10 MG PO TABS: 10.0000 mg | ORAL_TABLET | Freq: Every evening | ORAL | 3 refills | Status: DC

## 2023-09-07 MED ORDER — OLMESARTAN MEDOXOMIL 40 MG PO TABS
40.0000 mg | ORAL_TABLET | Freq: Every day | ORAL | 3 refills | Status: DC
Start: 2023-09-07 — End: 2023-12-25

## 2023-09-07 NOTE — Assessment & Plan Note (Signed)
Urine albumin creatinine ratio today.  She is currently diet controlled with her diabetes.  We discussed adding semaglutide for benefit for weight loss and potential cardiovascular risk as well as her liver disease.  Information was provided.  This medication does have some benefit for hepatic steatosis as well.

## 2023-09-07 NOTE — Assessment & Plan Note (Signed)
Repeat today.  She missed her gastroenterology appointment.  She has a history of a slightly elevated anti-smooth muscle antibody.  Biopsy was not recommended at repeat was.  New referral placed she will need to follow-up with gastroenterology.

## 2023-09-07 NOTE — Assessment & Plan Note (Signed)
At goal today.  Continue current medications she is not taking spironolactone will restart pending her labs and blood pressure at follow-up.

## 2023-09-07 NOTE — Assessment & Plan Note (Signed)
Discussed cessation.  Provided quit resources.

## 2023-09-07 NOTE — Assessment & Plan Note (Signed)
Discussed and recommended cessation for her liver, diabetes, weight and overall health.  We discussed that wine is in fact likely not healthier or safer.  We also discussed the complete cessation will be recommended.

## 2023-09-07 NOTE — Assessment & Plan Note (Signed)
Repeat today

## 2023-09-07 NOTE — Patient Instructions (Addendum)
It was wonderful to see you today.  Please bring ALL of your medications with you to every visit.   Today we talked about:   [place] job with your blood pressure!!!  You are doing amazing work for your health  We discussed quitting smoking--please let me know if you want a medication for this-- it is the best thing for your health   I recommend cutting back completely on Alcohol and Cannabis  I sent in your refills  We will check your blood work  Follow up in January  Check out WebMD for Semaglutide-also called Ozempic  Tobacco use is damaging to your body. It increases your risk of stroke, heart attack, lung cancer, and serious lung disease in the future. It also reduces your fertility.   Quitting tobacco is the best thing for your health but is a challenge---nicotine, a chemical in cigarettes, is highly addictive.   You can call 1 800 QUIT NOW [phone])---you will be connected with a Careers information officer. They can also mail you nicotine gums, lozenges, and patches to quit.   Ask me about patches (which you wear all day) and gums (which you use when you have a craving) to help you quit.   There are safe, effective medications to help you quit--  Varencline---also called Chantix---- is the most common medication used to help people stop smoking. It starts a low dose and is increased. I recommended choosing a quit date then starting the medication 8 days before this. Side effects include mild headache, difficulty sleeping, and odd dreams. The medication is typically very well tolerated.     Bupropion---also called Zyban---- is started 1 week before your quit date. You take 1 pill for three days then increase to 1 pill twice per day. Side effects include a mild headache and anxiety---this usually goes away. Some patients experience weight loss.     Please follow up in 2 weeks    Thank you for choosing [facility].   Please call [phone] with any  questions about today's appointment.  Please be sure to schedule follow up at the front  desk before you leave today.   Terisa Starr, MD  Family Medicine

## 2023-09-07 NOTE — Assessment & Plan Note (Signed)
Repeat today she is not taking the medication currently.

## 2023-09-08 ENCOUNTER — Telehealth: Payer: Self-pay | Admitting: Family Medicine

## 2023-09-08 LAB — MAGNESIUM: Magnesium: 1.3 mg/dL — ABNORMAL LOW (ref 1.6–2.3)

## 2023-09-08 LAB — HEPATIC FUNCTION PANEL
ALT: 37 [IU]/L — ABNORMAL HIGH (ref 0–32)
AST: 60 [IU]/L — ABNORMAL HIGH (ref 0–40)
Albumin: 4.2 g/dL (ref 3.9–4.9)
Alkaline Phosphatase: 201 [IU]/L — ABNORMAL HIGH (ref 44–121)
Bilirubin Total: 0.5 mg/dL (ref 0.0–1.2)
Bilirubin, Direct: 0.22 mg/dL (ref 0.00–0.40)
Total Protein: 7.5 g/dL (ref 6.0–8.5)

## 2023-09-08 LAB — BASIC METABOLIC PANEL
BUN/Creatinine Ratio: 13 (ref 9–23)
BUN: 7 mg/dL (ref 6–20)
CO2: 23 mmol/L (ref 20–29)
Calcium: 8.4 mg/dL — ABNORMAL LOW (ref 8.7–10.2)
Chloride: 100 mmol/L (ref 96–106)
Creatinine, Ser: 0.55 mg/dL — ABNORMAL LOW (ref 0.57–1.00)
Glucose: 136 mg/dL — ABNORMAL HIGH (ref 70–99)
Potassium: 3.8 mmol/L (ref 3.5–5.2)
Sodium: 143 mmol/L (ref 134–144)
eGFR: 122 mL/min/{1.73_m2} (ref >59)

## 2023-09-08 LAB — CBC
Hematocrit: 41 % (ref 34.0–46.6)
Hemoglobin: 13.7 g/dL (ref 11.1–15.9)
MCH: 31.6 pg (ref 26.6–33.0)
MCHC: 33.4 g/dL (ref 31.5–35.7)
MCV: 95 fL (ref 79–97)
Platelets: 405 10*3/uL (ref 150–450)
RBC: 4.33 x10E6/uL (ref 3.77–5.28)
RDW: 13.6 % (ref 11.7–15.4)
WBC: 12.1 10*3/uL — ABNORMAL HIGH (ref 3.4–10.8)

## 2023-09-08 LAB — VITAMIN B12: Vitamin B-12: 297 pg/mL (ref 232–1245)

## 2023-09-08 MED ORDER — MAGNESIUM OXIDE 400 MG PO TABS: 400.0000 mg | ORAL_TABLET | Freq: Every day | ORAL | 3 refills | Status: AC

## 2023-09-08 NOTE — Telephone Encounter (Signed)
Called patient and discussed results.  Discussed complete cessation of alcohol, need to follow-up with gastroenterology.  Repeat hepatic panel at follow-up.  I discussed the long-term consequences of alcohol use disorder including cirrhosis with problems with ascites and gastrointestinal bleeding.  Magnesium is low prescribed magnesium.  Follow-up in January for repeat labs.

## 2023-12-06 ENCOUNTER — Encounter: Payer: Self-pay | Admitting: Family Medicine

## 2023-12-18 ENCOUNTER — Encounter: Payer: Self-pay | Admitting: Family Medicine

## 2023-12-18 ENCOUNTER — Ambulatory Visit (INDEPENDENT_AMBULATORY_CARE_PROVIDER_SITE_OTHER): Admitting: Family Medicine

## 2023-12-18 VITALS — BP 138/98 | HR 76 | Ht 63.0 in | Wt 286.1 lb

## 2023-12-18 DIAGNOSIS — S060X0A Concussion without loss of consciousness, initial encounter: Secondary | ICD-10-CM | POA: Diagnosis not present

## 2023-12-18 MED ORDER — MELOXICAM 15 MG PO TABS
15.0000 mg | ORAL_TABLET | Freq: Every day | ORAL | 0 refills | Status: AC
Start: 2023-12-18 — End: 2024-01-01

## 2023-12-18 MED ORDER — TIZANIDINE HCL 4 MG PO TABS
4.0000 mg | ORAL_TABLET | Freq: Four times a day (QID) | ORAL | 0 refills | Status: AC | PRN
Start: 2023-12-18 — End: ?
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
## 2023-12-18 NOTE — Patient Instructions (Addendum)
 It was great to see you! Thank you for allowing me to participate in your care!  Our plans for today:  - I have sent Tizandine and Mobic to your pharmacy. This should help with your pain. - If you're headache worsens or is brought on a work, please stop and we can write you out of work. - Please follow-up in 4 weeks to see how your are doing.   Please arrive 15 minutes PRIOR to your next scheduled appointment time! If you do not, this affects OTHER patients' care.  Take care and seek immediate care sooner if you develop any concerns.   Celine Mans, MD, PGY-2 [facility] 4:33 PM 12/18/2023  [facility]

## 2023-12-19 ENCOUNTER — Other Ambulatory Visit: Payer: Self-pay

## 2023-12-19 DIAGNOSIS — I10 Essential (primary) hypertension: Secondary | ICD-10-CM

## 2023-12-25 ENCOUNTER — Other Ambulatory Visit: Payer: Self-pay

## 2023-12-25 DIAGNOSIS — I10 Essential (primary) hypertension: Secondary | ICD-10-CM

## 2023-12-26 MED ORDER — OLMESARTAN MEDOXOMIL 40 MG PO TABS
40.0000 mg | ORAL_TABLET | Freq: Every day | ORAL | 0 refills | Status: DC
Start: 2023-12-26 — End: 2024-04-22

## 2024-01-02 ENCOUNTER — Telehealth: Payer: Self-pay | Admitting: Family Medicine

## 2024-01-02 NOTE — Telephone Encounter (Signed)
 Patient calling to check status of form faxed from [facility] Form in PCP box.

## 2024-01-03 NOTE — Telephone Encounter (Signed)
 Form placed up front for pick up.   Copy made for batch scanning.   Placed in faxed pile.  Patient aware.

## 2024-01-03 NOTE — Telephone Encounter (Signed)
Reviewed, completed, and signed form.  Note routed to RN team inbasket and placed completed form in RN Wall pocket in the front office.  Shadeed Colberg M Lorne Winkels, MD  

## 2024-01-10 ENCOUNTER — Telehealth: Payer: Self-pay

## 2024-01-10 NOTE — Telephone Encounter (Signed)
 Pharmacy Patient Advocate Encounter   Received notification from CoverMyMeds that prior authorization for TRULICITY is required/requested.   Insurance verification completed.   The patient is insured through [facility] .   PA required; PA submitted to above mentioned insurance via CoverMyMeds Key/confirmation #/EOC [id number]. Status is pending

## 2024-01-10 NOTE — Telephone Encounter (Signed)
 Pharmacy Patient Advocate Encounter  Received notification from [facility] that Prior Authorization for TRULICITY 0.75MG  has been APPROVED from 01/10/24 to 01/09/25   PA #/Case ID/Reference #: [id number]

## 2024-01-16 ENCOUNTER — Encounter: Payer: Self-pay | Admitting: Family Medicine

## 2024-01-20 ENCOUNTER — Other Ambulatory Visit: Payer: Self-pay | Admitting: Family Medicine

## 2024-01-20 DIAGNOSIS — S060X0A Concussion without loss of consciousness, initial encounter: Secondary | ICD-10-CM

## 2024-02-04 ENCOUNTER — Other Ambulatory Visit: Payer: Self-pay | Admitting: Family Medicine

## 2024-02-04 DIAGNOSIS — S060X0A Concussion without loss of consciousness, initial encounter: Secondary | ICD-10-CM

## 2024-03-15 ENCOUNTER — Encounter: Payer: Self-pay | Admitting: Student

## 2024-03-15 ENCOUNTER — Ambulatory Visit: Admitting: Student

## 2024-03-15 VITALS — BP 170/110 | HR 100 | Ht 63.0 in | Wt 289.8 lb

## 2024-03-15 DIAGNOSIS — R09A2 Foreign body sensation, throat: Secondary | ICD-10-CM | POA: Diagnosis not present

## 2024-03-15 DIAGNOSIS — I10 Essential (primary) hypertension: Secondary | ICD-10-CM | POA: Diagnosis not present

## 2024-03-15 MED ORDER — FLUTICASONE PROPIONATE 50 MCG/ACT NA SUSP: 2.0000 | Freq: Every day | NASAL | 6 refills | Status: AC

## 2024-03-15 MED ORDER — CETIRIZINE HCL 10 MG PO TABS: 10.0000 mg | ORAL_TABLET | Freq: Every day | ORAL | 11 refills | Status: AC | PRN

## 2024-03-15 NOTE — Assessment & Plan Note (Addendum)
 Patient comes in with concern of lump in her throat, this been an issue since this morning.  Patient has been able to drink and swallow solids and fluids just fine, but notes weird sensation back in her throat while swallowing.  Patient with notable mucus/postnasal drip noted on uvula and back of throat, patient also complaining of itchy nose, congestion, itchy ears.  Patient also appreciating she feels a little under the weather but denies any fevers or breathing changes.  Most concern with allergies, will trial allergy medication.  Low concern for infection given lack of fevers, and lack of pharyngeal change. - Zyrtec 10 mg daily - Flonase 2 sprays each nostril daily - Follow-up 1 week as needed - ED precautions for difficulty breathing, difficulty swallowing, drooling

## 2024-03-15 NOTE — Patient Instructions (Addendum)
 It was great to see you! Thank you for allowing me to participate in your care!  I recommend that you always bring your medications to each appointment as this makes it easy to ensure we are on the correct medications and helps us  not miss when refills are needed.  Our plans for today:  - Throat sensation I think the sensation you are having in your throat is coming from mucous. This is called post nasal drip. We will try some allergy medicine to see if this improves the sensation.    Take Zyrtec  10 mg daily  Take Flonaze 2 sprays each nostril daily  *If you start to develop fevers, worsening throat pain, difficulty swallowing, difficulty breathing, or drooling, go to Emergency Department, or Call 911.  - Blood Pressure  Make a follow up appointment in 1-2 weeks for a blood pressure recheck.   Take care and seek immediate care sooner if you develop any concerns.   Dr. Penne Rhein, MD [facility]

## 2024-03-15 NOTE — Progress Notes (Addendum)
  SUBJECTIVE:   CHIEF COMPLAINT / HPI:   Throat Issues This morning, she woke up with a sensation of something stuck in her throat. She attempted to alleviate this feeling by drinking water and clearing her throat, but these measures did not resolve the sensation. She describes the feeling as 'weird' and notes that she can feel something moving in her throat when she tries to clear it.  She has a sore throat that feels both 'weird' and sore. No fever. She has a decreased appetite today and has only consumed Jell-O and water, as swallowing is painful, although she does not feel like food is getting stuck.  She has a history of tobacco use, smoking two to three cigarettes a day for the past fifteen years. Recently, she has experienced a runny nose, itchy nose, itchy ears, and itchy eyes. She feels 'a little icky' and under the weather, with some body aches.  PERTINENT  PMH / PSH:   OBJECTIVE:  BP (!) 170/110 Comment: manually  Pulse 100   Ht 5' 3 (1.6 m)   Wt 289 lb 12.8 oz (131.5 kg)   SpO2 98%   BMI 51.34 kg/m  Physical Exam HENT:     Mouth/Throat:     Lips: Pink. No lesions.     Mouth: Mucous membranes are moist. No injury or oral lesions.     Tongue: No lesions. Tongue does not deviate from midline.     Pharynx: Postnasal drip present. No pharyngeal swelling, oropharyngeal exudate or posterior oropharyngeal erythema.  Neck:     Trachea: Trachea and phonation normal.   Musculoskeletal:     Cervical back: Normal range of motion. No edema, erythema, signs of trauma, rigidity or tenderness. No pain with movement or muscular tenderness.  Lymphadenopathy:     Cervical: No cervical adenopathy.      ASSESSMENT/PLAN:   Assessment & Plan Sensation of lump in throat Patient comes in with concern of lump in her throat, this been an issue since this morning.  Patient has been able to drink and swallow solids and fluids just fine, but notes weird sensation back in her throat while  swallowing.  Patient with notable mucus/postnasal drip noted on uvula and back of throat, patient also complaining of itchy nose, congestion, itchy ears.  Patient also appreciating she feels a little under the weather but denies any fevers or breathing changes.  Most concern with allergies, will trial allergy medication.  Low concern for infection given lack of fevers, and lack of pharyngeal change. - Zyrtec  10 mg daily - Flonase  2 sprays each nostril daily - Follow-up 1 week as needed - ED precautions for difficulty breathing, difficulty swallowing, drooling Essential hypertension Patient BP elevated in clinic, patient had not taken her medication. Will have patient return for BP recheck. -F/u 1-2 weeks for BP recheck No follow-ups on file. Penne Rhein, MD 03/28/2024, 10:43 AM PGY-3, [facility]

## 2024-03-18 ENCOUNTER — Other Ambulatory Visit: Payer: Self-pay

## 2024-03-28 NOTE — Assessment & Plan Note (Addendum)
 Patient BP elevated in clinic, patient had not taken her medication. Will have patient return for BP recheck. -F/u 1-2 weeks for BP recheck

## 2024-04-22 ENCOUNTER — Other Ambulatory Visit: Payer: Self-pay | Admitting: *Deleted

## 2024-04-22 DIAGNOSIS — I10 Essential (primary) hypertension: Secondary | ICD-10-CM

## 2024-04-22 MED ORDER — OLMESARTAN MEDOXOMIL 40 MG PO TABS
40.0000 mg | ORAL_TABLET | Freq: Every day | ORAL | 0 refills | Status: AC
Start: 2024-04-22 — End: ?

## 2024-04-22 NOTE — Telephone Encounter (Signed)
 Please call patient and schedule BP follow up with any physician. Suzann Daring, MD  Family Medicine Teaching Service

## 2024-06-10 NOTE — Assessment & Plan Note (Deleted)
 Repeat A1c ***. - Continue dietary and lifestyle modifications

## 2024-06-10 NOTE — Progress Notes (Deleted)
    SUBJECTIVE:   CHIEF COMPLAINT / HPI:   HTN   T2DM - Last A1c 5.7 in 04/2023 - Home CBGs: *** - Medications: none - Adherence: *** - Eye exam: *** - Foot exam: *** - Microalbumin: *** - Statin: woman of child bearing age - No symptoms of hypoglycemia, polyuria, polydipsia, numbness extremities, foot ulcers/trauma   PERTINENT  PMH / PSH: T2DM, Obesity, HTN  OBJECTIVE:   There were no vitals taken for this visit.  General: Awake and Alert in NAD HEENT: NCAT. Sclera anicteric. No rhinorrhea. Cardiovascular: RRR. No M/R/G Respiratory: CTAB, normal WOB on RA. No wheezing, crackles, rhonchi, or diminished breath sounds. Abdomen: Soft, non-tender, non-distended. Bowel sounds normoactive/hypoactive/hyperactive. *** Extremities: Able to move all extremities. No BLE edema, no deformities or significant joint findings. Skin: Warm and dry. No abrasions or rashes noted. Neuro: A&Ox***. No focal neurological deficits.  Diabetic Foot Exam - Simple   No data filed    ASSESSMENT/PLAN:   Assessment & Plan Diabetes mellitus without complication (HCC) Repeat A1c ***. - Continue dietary and lifestyle modifications Essential hypertension Patient's blood pressure {is/is not:320031} controlled today.  . Goal of {BPGOAL:29904}. Patient's medication regimen includes ***. - Changes to current regimen include *** - Referral to pharmacy clinic due to {KOVALBPCRITERIA:29903} - Labs: BMP, urine microalbumin/creatinine *** - BP log added to patient's AVS*** - Follow up in *** weeks      Kathrine Melena, DO [facility]

## 2024-06-10 NOTE — Assessment & Plan Note (Deleted)
 Patient's blood pressure {is/is not:320031} controlled today.  . Goal of {BPGOAL:29904}. Patient's medication regimen includes ***. - Changes to current regimen include *** - Referral to pharmacy clinic due to {KOVALBPCRITERIA:29903} - Labs: BMP, urine microalbumin/creatinine *** - BP log added to patient's AVS*** - Follow up in *** weeks

## 2024-06-11 ENCOUNTER — Ambulatory Visit: Admitting: Family Medicine

## 2024-06-11 DIAGNOSIS — I10 Essential (primary) hypertension: Secondary | ICD-10-CM

## 2024-06-11 DIAGNOSIS — E119 Type 2 diabetes mellitus without complications: Secondary | ICD-10-CM

## 2024-06-17 ENCOUNTER — Ambulatory Visit: Admitting: Family Medicine

## 2024-06-17 DIAGNOSIS — E119 Type 2 diabetes mellitus without complications: Secondary | ICD-10-CM

## 2024-06-18 ENCOUNTER — Other Ambulatory Visit: Payer: Self-pay | Admitting: Family Medicine

## 2024-06-18 ENCOUNTER — Other Ambulatory Visit

## 2024-06-18 DIAGNOSIS — E119 Type 2 diabetes mellitus without complications: Secondary | ICD-10-CM

## 2024-06-18 DIAGNOSIS — E538 Deficiency of other specified B group vitamins: Secondary | ICD-10-CM

## 2024-06-18 DIAGNOSIS — R748 Abnormal levels of other serum enzymes: Secondary | ICD-10-CM

## 2024-07-19 ENCOUNTER — Other Ambulatory Visit: Payer: Self-pay | Admitting: Family Medicine

## 2024-07-19 DIAGNOSIS — S060X0A Concussion without loss of consciousness, initial encounter: Secondary | ICD-10-CM

## 2024-10-01 ENCOUNTER — Other Ambulatory Visit: Payer: Self-pay | Admitting: *Deleted

## 2024-10-01 DIAGNOSIS — I10 Essential (primary) hypertension: Secondary | ICD-10-CM

## 2024-10-02 MED ORDER — AMLODIPINE BESYLATE 10 MG PO TABS: 10.0000 mg | ORAL_TABLET | Freq: Every evening | ORAL | 0 refills | Status: DC

## 2024-10-02 NOTE — Telephone Encounter (Signed)
 Hi Risk Manager,  Please schedule this patient for follow up with me or any physician for BP in the next 1 months.  Thanks, Suzann Daring, MD  [facility] Medicine Teaching Service

## 2024-10-10 ENCOUNTER — Other Ambulatory Visit: Payer: Self-pay

## 2024-10-10 DIAGNOSIS — I10 Essential (primary) hypertension: Secondary | ICD-10-CM

## 2024-10-10 MED ORDER — AMLODIPINE BESYLATE 10 MG PO TABS: 10.0000 mg | ORAL_TABLET | Freq: Every evening | ORAL | 0 refills | Status: AC

## 2024-10-10 NOTE — Telephone Encounter (Signed)
 Pt requesting 90 supply. Cassell Mary CMA

## 2024-10-17 ENCOUNTER — Ambulatory Visit: Payer: Self-pay | Admitting: Family Medicine

## 2024-10-17 NOTE — Progress Notes (Unsigned)
" ° ° °  SUBJECTIVE:   CHIEF COMPLAINT / HPI:   Hypertension: - Medications: Amlodipine  10 mg daily, olmesartan  40 mg daily (utilizing Nexplanon  for birth control) - Compliance: *** - Checking BP at home: *** - Denies any SOB, CP, vision changes, LE edema, medication SEs, or symptoms of hypotension  *Lab work requested by PCP  PERTINENT  PMH / PSH: ***  OBJECTIVE:   There were no vitals taken for this visit. ***  General: NAD, pleasant, able to participate in exam Cardiac: RRR, no murmurs. Respiratory: CTAB, normal effort, No wheezes, rales or rhonchi Abdomen: Bowel sounds present, nontender, nondistended Extremities: no edema or cyanosis. Skin: warm and dry, no rashes noted Neuro: alert, no obvious focal deficits Psych: Normal affect and mood  ASSESSMENT/PLAN:   No problem-specific Assessment & Plan notes found for this encounter.     Dr. Izetta Nap, DO [facility]    {    This will disappear when note is signed, click to select method of visit    :1} "

## 2024-10-24 ENCOUNTER — Ambulatory Visit: Payer: Self-pay

## 2024-11-07 ENCOUNTER — Ambulatory Visit

## 2024-11-14 ENCOUNTER — Ambulatory Visit
